# Patient Record
Sex: Male | Born: 1955 | Race: White | Hispanic: No | Marital: Single | Smoking: Current every day smoker
Health system: Southern US, Community
[De-identification: ages and names within clinical notes are randomized; demographics above are authoritative.]

## PROBLEM LIST (undated history)

## (undated) DIAGNOSIS — M1990 Unspecified osteoarthritis, unspecified site: Secondary | ICD-10-CM

## (undated) DIAGNOSIS — I509 Heart failure, unspecified: Secondary | ICD-10-CM

## (undated) DIAGNOSIS — I249 Acute ischemic heart disease, unspecified: Secondary | ICD-10-CM

## (undated) DIAGNOSIS — J449 Chronic obstructive pulmonary disease, unspecified: Secondary | ICD-10-CM

## (undated) DIAGNOSIS — J45909 Unspecified asthma, uncomplicated: Secondary | ICD-10-CM

## (undated) DIAGNOSIS — B1920 Unspecified viral hepatitis C without hepatic coma: Secondary | ICD-10-CM

## (undated) DIAGNOSIS — E119 Type 2 diabetes mellitus without complications: Secondary | ICD-10-CM

## (undated) DIAGNOSIS — I10 Essential (primary) hypertension: Secondary | ICD-10-CM

---

## 2005-09-29 ENCOUNTER — Ambulatory Visit: Payer: Self-pay

## 2005-10-28 ENCOUNTER — Ambulatory Visit: Payer: Self-pay

## 2005-11-08 ENCOUNTER — Emergency Department: Payer: Self-pay | Admitting: Emergency Medicine

## 2005-11-08 ENCOUNTER — Other Ambulatory Visit: Payer: Self-pay

## 2006-05-16 ENCOUNTER — Emergency Department: Payer: Self-pay | Admitting: Unknown Physician Specialty

## 2006-05-16 ENCOUNTER — Other Ambulatory Visit: Payer: Self-pay

## 2007-03-20 ENCOUNTER — Other Ambulatory Visit: Payer: Self-pay

## 2007-03-20 ENCOUNTER — Emergency Department: Payer: Self-pay | Admitting: Emergency Medicine

## 2009-11-04 ENCOUNTER — Emergency Department: Payer: Self-pay | Admitting: Emergency Medicine

## 2011-05-22 ENCOUNTER — Inpatient Hospital Stay: Payer: Self-pay | Admitting: Internal Medicine

## 2011-05-23 DIAGNOSIS — R079 Chest pain, unspecified: Secondary | ICD-10-CM

## 2011-05-23 DIAGNOSIS — R7989 Other specified abnormal findings of blood chemistry: Secondary | ICD-10-CM

## 2011-05-25 DIAGNOSIS — I369 Nonrheumatic tricuspid valve disorder, unspecified: Secondary | ICD-10-CM

## 2011-06-02 ENCOUNTER — Emergency Department: Payer: Self-pay | Admitting: Internal Medicine

## 2011-06-09 ENCOUNTER — Emergency Department: Payer: Self-pay | Admitting: Emergency Medicine

## 2011-06-16 ENCOUNTER — Emergency Department: Payer: Self-pay | Admitting: Emergency Medicine

## 2011-06-21 ENCOUNTER — Emergency Department: Payer: Self-pay | Admitting: *Deleted

## 2011-06-23 ENCOUNTER — Emergency Department: Payer: Self-pay | Admitting: Emergency Medicine

## 2011-09-08 ENCOUNTER — Ambulatory Visit: Payer: Self-pay

## 2011-09-09 ENCOUNTER — Ambulatory Visit: Payer: Self-pay

## 2012-01-17 ENCOUNTER — Ambulatory Visit: Payer: Self-pay | Admitting: Gastroenterology

## 2012-02-29 ENCOUNTER — Ambulatory Visit: Payer: Self-pay | Admitting: Gastroenterology

## 2012-03-02 LAB — PATHOLOGY REPORT

## 2012-03-26 ENCOUNTER — Emergency Department: Payer: Self-pay | Admitting: *Deleted

## 2012-03-26 LAB — CK TOTAL AND CKMB (NOT AT ARMC)
CK, Total: 84 U/L (ref 35–232)
CK-MB: 1.2 ng/mL (ref 0.5–3.6)

## 2012-03-26 LAB — CBC
MCH: 30.9 pg (ref 26.0–34.0)
Platelet: 300 10*3/uL (ref 150–440)
RBC: 4.96 10*6/uL (ref 4.40–5.90)
RDW: 13.6 % (ref 11.5–14.5)
WBC: 8.7 10*3/uL (ref 3.8–10.6)

## 2012-03-26 LAB — BASIC METABOLIC PANEL
BUN: 11 mg/dL (ref 7–18)
Co2: 31 mmol/L (ref 21–32)
Creatinine: 0.96 mg/dL (ref 0.60–1.30)
EGFR (African American): >60
EGFR (Non-African Amer.): >60
Glucose: 190 mg/dL — ABNORMAL HIGH (ref 65–99)
Potassium: 4 mmol/L (ref 3.5–5.1)
Sodium: 140 mmol/L (ref 136–145)

## 2012-03-26 LAB — TROPONIN I: Troponin-I: 0.05 ng/mL

## 2012-03-27 ENCOUNTER — Emergency Department: Payer: Self-pay | Admitting: Emergency Medicine

## 2012-03-27 LAB — COMPREHENSIVE METABOLIC PANEL
Alkaline Phosphatase: 66 U/L (ref 50–136)
BUN: 12 mg/dL (ref 7–18)
Bilirubin,Total: 0.3 mg/dL (ref 0.2–1.0)
Calcium, Total: 9 mg/dL (ref 8.5–10.1)
Co2: 30 mmol/L (ref 21–32)
EGFR (Non-African Amer.): >60
Potassium: 3.7 mmol/L (ref 3.5–5.1)
SGPT (ALT): 30 U/L

## 2012-03-27 LAB — CBC
MCH: 30.7 pg (ref 26.0–34.0)
MCHC: 32.4 g/dL (ref 32.0–36.0)
MCV: 95 fL (ref 80–100)
Platelet: 300 10*3/uL (ref 150–440)
RBC: 4.99 10*6/uL (ref 4.40–5.90)
WBC: 9.6 10*3/uL (ref 3.8–10.6)

## 2012-03-28 ENCOUNTER — Ambulatory Visit: Payer: Self-pay | Admitting: Emergency Medicine

## 2012-03-28 ENCOUNTER — Emergency Department: Payer: Self-pay | Admitting: *Deleted

## 2012-03-28 LAB — URINALYSIS, COMPLETE
Bacteria: NEGATIVE
Blood: NEGATIVE
Glucose,UR: NEGATIVE mg/dL (ref 0–75)
Ketone: NEGATIVE
Leukocyte Esterase: NEGATIVE
Protein: NEGATIVE
Specific Gravity: 1.02 (ref 1.003–1.030)
Squamous Epithelial: NONE SEEN
WBC UR: NONE SEEN /HPF (ref 0–5)

## 2012-03-30 LAB — URINE CULTURE

## 2012-07-31 LAB — COMPREHENSIVE METABOLIC PANEL
Anion Gap: 7 (ref 7–16)
BUN: 13 mg/dL (ref 7–18)
Bilirubin,Total: 0.3 mg/dL (ref 0.2–1.0)
Chloride: 106 mmol/L (ref 98–107)
Co2: 27 mmol/L (ref 21–32)
Creatinine: 0.89 mg/dL (ref 0.60–1.30)
EGFR (African American): >60
EGFR (Non-African Amer.): >60
Glucose: 115 mg/dL — ABNORMAL HIGH (ref 65–99)
Osmolality: 280 (ref 275–301)
Potassium: 4 mmol/L (ref 3.5–5.1)
SGPT (ALT): 42 U/L (ref 12–78)
Sodium: 140 mmol/L (ref 136–145)

## 2012-07-31 LAB — PROTIME-INR: Prothrombin Time: 12.4 secs (ref 11.5–14.7)

## 2012-07-31 LAB — CBC WITH DIFFERENTIAL/PLATELET
Basophil %: 0.7 %
Eosinophil #: 0.2 10*3/uL (ref 0.0–0.7)
Eosinophil %: 2.2 %
HCT: 44.6 % (ref 40.0–52.0)
HGB: 15.1 g/dL (ref 13.0–18.0)
Lymphocyte #: 2.9 10*3/uL (ref 1.0–3.6)
Lymphocyte %: 35.7 %
MCHC: 33.8 g/dL (ref 32.0–36.0)
Monocyte #: 0.9 x10 3/mm (ref 0.2–1.0)
Monocyte %: 11.3 %
Neutrophil %: 50.1 %
Platelet: 306 10*3/uL (ref 150–440)
WBC: 8.2 10*3/uL (ref 3.8–10.6)

## 2012-08-01 ENCOUNTER — Inpatient Hospital Stay: Payer: Self-pay | Admitting: Family Medicine

## 2012-08-01 LAB — URINALYSIS, COMPLETE
Bacteria: NONE SEEN
Leukocyte Esterase: NEGATIVE
Nitrite: NEGATIVE
Ph: 6 (ref 4.5–8.0)
Protein: NEGATIVE
Specific Gravity: 1.03 (ref 1.003–1.030)
WBC UR: <1 /HPF (ref 0–5)

## 2012-08-01 LAB — CK TOTAL AND CKMB (NOT AT ARMC)
CK, Total: 98 U/L (ref 35–232)
CK, Total: 82 U/L (ref 35–232)
CK-MB: 1.8 ng/mL (ref 0.5–3.6)
CK-MB: 1.7 ng/mL (ref 0.5–3.6)

## 2012-08-01 LAB — TROPONIN I: Troponin-I: <0.02 ng/mL

## 2012-08-02 LAB — BASIC METABOLIC PANEL
Anion Gap: 5 — ABNORMAL LOW (ref 7–16)
BUN: 12 mg/dL (ref 7–18)
Chloride: 110 mmol/L — ABNORMAL HIGH (ref 98–107)
Co2: 26 mmol/L (ref 21–32)
EGFR (Non-African Amer.): >60
Osmolality: 283 (ref 275–301)
Sodium: 141 mmol/L (ref 136–145)

## 2012-08-02 LAB — CBC WITH DIFFERENTIAL/PLATELET
Eosinophil #: 0 10*3/uL (ref 0.0–0.7)
Eosinophil %: 0.4 %
MCH: 30.2 pg (ref 26.0–34.0)
MCHC: 32.4 g/dL (ref 32.0–36.0)
MCV: 93 fL (ref 80–100)
Monocyte #: 1 x10 3/mm (ref 0.2–1.0)
Platelet: 279 10*3/uL (ref 150–440)
RDW: 13.3 % (ref 11.5–14.5)

## 2012-08-03 LAB — BASIC METABOLIC PANEL
Anion Gap: 3 — ABNORMAL LOW (ref 7–16)
BUN: 11 mg/dL (ref 7–18)
Calcium, Total: 8.1 mg/dL — ABNORMAL LOW (ref 8.5–10.1)
Chloride: 105 mmol/L (ref 98–107)
Co2: 30 mmol/L (ref 21–32)
Creatinine: 0.87 mg/dL (ref 0.60–1.30)
EGFR (African American): >60
EGFR (Non-African Amer.): >60
Potassium: 3.9 mmol/L (ref 3.5–5.1)
Sodium: 138 mmol/L (ref 136–145)

## 2012-08-03 LAB — CBC WITH DIFFERENTIAL/PLATELET
Basophil #: 0 10*3/uL (ref 0.0–0.1)
Basophil %: 0.3 %
Eosinophil #: 0.1 10*3/uL (ref 0.0–0.7)
HCT: 42.5 % (ref 40.0–52.0)
Lymphocyte #: 3.4 10*3/uL (ref 1.0–3.6)
Lymphocyte %: 31.4 %
MCH: 31.1 pg (ref 26.0–34.0)
MCV: 94 fL (ref 80–100)
Monocyte %: 8.7 %
Neutrophil #: 6.4 10*3/uL (ref 1.4–6.5)
RBC: 4.54 10*6/uL (ref 4.40–5.90)
RDW: 13.3 % (ref 11.5–14.5)
WBC: 10.9 10*3/uL — ABNORMAL HIGH (ref 3.8–10.6)

## 2012-08-23 LAB — CULTURE, FUNGUS WITHOUT SMEAR

## 2012-12-10 ENCOUNTER — Inpatient Hospital Stay: Payer: Self-pay | Admitting: Internal Medicine

## 2012-12-10 LAB — CBC
HGB: 15.3 g/dL (ref 13.0–18.0)
MCH: 30.4 pg (ref 26.0–34.0)
Platelet: 278 10*3/uL (ref 150–440)
RBC: 5.02 10*6/uL (ref 4.40–5.90)
RDW: 13.9 % (ref 11.5–14.5)
WBC: 10.4 10*3/uL (ref 3.8–10.6)

## 2012-12-10 LAB — BASIC METABOLIC PANEL
BUN: 16 mg/dL (ref 7–18)
Chloride: 104 mmol/L (ref 98–107)
Co2: 29 mmol/L (ref 21–32)
Creatinine: 1.22 mg/dL (ref 0.60–1.30)
EGFR (Non-African Amer.): >60
Sodium: 139 mmol/L (ref 136–145)

## 2012-12-10 LAB — HEPATIC FUNCTION PANEL A (ARMC)
Albumin: 3.2 g/dL — ABNORMAL LOW (ref 3.4–5.0)
Alkaline Phosphatase: 70 U/L (ref 50–136)
Bilirubin,Total: 0.3 mg/dL (ref 0.2–1.0)
SGOT(AST): 24 U/L (ref 15–37)
SGPT (ALT): 36 U/L (ref 12–78)
Total Protein: 7.9 g/dL (ref 6.4–8.2)

## 2012-12-10 LAB — LIPASE, BLOOD: Lipase: 346 U/L (ref 73–393)

## 2012-12-10 LAB — CK TOTAL AND CKMB (NOT AT ARMC): CK, Total: 87 U/L (ref 35–232)

## 2012-12-11 DIAGNOSIS — R079 Chest pain, unspecified: Secondary | ICD-10-CM

## 2012-12-11 DIAGNOSIS — I059 Rheumatic mitral valve disease, unspecified: Secondary | ICD-10-CM

## 2012-12-11 LAB — LIPID PANEL
Cholesterol: 172 mg/dL (ref 0–200)
HDL Cholesterol: 32 mg/dL — ABNORMAL LOW (ref 40–60)
Ldl Cholesterol, Calc: 132 mg/dL — ABNORMAL HIGH (ref 0–100)
Triglycerides: 41 mg/dL (ref 0–200)
VLDL Cholesterol, Calc: 8 mg/dL (ref 5–40)

## 2012-12-11 LAB — TROPONIN I
Troponin-I: <0.02 ng/mL
Troponin-I: <0.02 ng/mL

## 2012-12-11 LAB — CBC WITH DIFFERENTIAL/PLATELET
Basophil #: 0 10*3/uL (ref 0.0–0.1)
Basophil %: 0.4 %
Eosinophil #: 0 10*3/uL (ref 0.0–0.7)
Eosinophil %: 0 %
HGB: 14.6 g/dL (ref 13.0–18.0)
Lymphocyte #: 0.5 10*3/uL — ABNORMAL LOW (ref 1.0–3.6)
MCH: 31 pg (ref 26.0–34.0)
MCHC: 33.4 g/dL (ref 32.0–36.0)
Neutrophil #: 8.9 10*3/uL — ABNORMAL HIGH (ref 1.4–6.5)
Neutrophil %: 92.9 %
Platelet: 260 10*3/uL (ref 150–440)
RBC: 4.7 10*6/uL (ref 4.40–5.90)
WBC: 9.5 10*3/uL (ref 3.8–10.6)

## 2012-12-11 LAB — DRUG SCREEN, URINE
Amphetamines, Ur Screen: NEGATIVE (ref ?–1000)
Barbiturates, Ur Screen: NEGATIVE (ref ?–200)
Benzodiazepine, Ur Scrn: NEGATIVE (ref ?–200)
Cannabinoid 50 Ng, Ur ~~LOC~~: NEGATIVE (ref ?–50)
Cocaine Metabolite,Ur ~~LOC~~: POSITIVE (ref ?–300)
MDMA (Ecstasy)Ur Screen: NEGATIVE (ref ?–500)
Methadone, Ur Screen: NEGATIVE (ref ?–300)
Tricyclic, Ur Screen: NEGATIVE (ref ?–1000)

## 2012-12-11 LAB — BASIC METABOLIC PANEL
Chloride: 103 mmol/L (ref 98–107)
EGFR (Non-African Amer.): >60
Glucose: 216 mg/dL — ABNORMAL HIGH (ref 65–99)
Potassium: 4.1 mmol/L (ref 3.5–5.1)
Sodium: 136 mmol/L (ref 136–145)

## 2012-12-11 LAB — CK-MB: CK-MB: 0.9 ng/mL (ref 0.5–3.6)

## 2012-12-20 ENCOUNTER — Emergency Department: Payer: Self-pay | Admitting: Internal Medicine

## 2013-05-05 ENCOUNTER — Emergency Department: Payer: Self-pay | Admitting: Emergency Medicine

## 2013-05-09 LAB — WOUND CULTURE

## 2014-12-23 NOTE — H&P (Signed)
PATIENT NAME:  Kyle Rich, [name] MR#:  [id number] DATE OF BIRTH:  [birth year]  DATE OF ADMISSION:  08/01/2012   PRIMARY CARE PHYSICIAN: None local.   CHIEF COMPLAINT: Coughing up blood.   HISTORY OF PRESENT ILLNESS: The patient is a 59 year old Caucasian male with a past medical history of COPD, still smoking, oxygen dependent, hypertension, and tobacco abuse who is presenting to the ER with a chief complaint of coughing up blood. The patient was in his usual state of health until yesterday and then started having hemoptysis yesterday afternoon. He denies any similar complaints in the past. This is associated with chest tightness. Denies any chest pain or shortness of breath. He denies any weight loss but, in fact, he is gaining weight. Denies any dizziness or loss of consciousness. Denies any sick contacts or tuberculosis. His mom had history of tuberculosis when she was young. CT of chest was done in the ER which showed no evidence of acute pulmonary embolism and no gross endobronchial lesions. The patient denies any fever, chills. Hospitalist team is called to admit the patient regarding hemoptysis for possible bronchoscopy in a.m.   PAST MEDICAL HISTORY:  1. Hypertension. 2. Tobacco abuse. 3. Chronic obstructive pulmonary disease, oxygen dependent. He is on 2 liters of oxygen.   PAST SURGICAL HISTORY: None.  ALLERGIES: No known drug allergies.   HOME MEDICATIONS:  1. Valium 5 mg p.o. 4 times a day. 2. Spiriva 1 puff inhalation once a day.  3. ProAir 2 puff inhalation 4 times a day.  4. Prednisone 10 mg tablets 5 tablets orally once a day and 3 tablets orally once a day.  5. Percocet 10/325 p.o. q.6 hours. 6. Omeprazole 20 mg p.o. daily. 7. Naproxen 500 mg twice a day. 8. Lisinopril 20 mg once a day.  9. Klor-Con 20 mEq 1 tablet once a day.  10. Hydrochlorothiazide 25 mg half tablet once a day. 11. Advair Diskus 250 mcg 1 puff inhalations two times a day.  12. Tylenol 1 tablet p.o. q.6  hours.   PSYCHOSOCIAL HISTORY: Lives at home, lives alone. He uses 2 liters of oxygen 24/7 in view of his COPD. Still smokes 1 pack a day and drinks alcohol at least 2 to 3 times per week.   FAMILY HISTORY: Mother has history of bladder cancer. Father died of complication of the lung.   REVIEW OF SYSTEMS: CONSTITUTIONAL: Denies any weakness. Positive weight gain. EYES: Denies any blurry vision, inflammation of the eye, redness. ENT: Denies any postnasal drip, nasal discharge, tinnitus, ringing in his ears, ear pain, difficulty in swallowing. NECK: Denies any neck pain, neck masses. LUNGS: Positive coughing up blood. Denies pneumonia. Denies any past medical history of tuberculosis. Denies any shortness of breath. Complaining of chest tightness. CARDIOVASCULAR: No chest pain or shortness of breath. No palpitations. No syncope or palpitations. GI: Denies any abdominal pain, constipation. Denies diarrhea. Denies hematemesis or hematochezia. NEUROLOGIC: Denies any dizziness or syncope. Denies any weakness, tiredness, difficulty in swallowing, speech difficulties. Denies any headache, blurry vision. MUSCULOSKELETAL: Denies back pain, neck pain, abdominal pain, shoulder pain, leg pain. Denies any gout, arthritis. SKIN: Denies any rashes, lesions. Denies any clubbing. ENDOCRINE: Denies any polyuria, polyphagia, polydipsia. Denies any diabetes. INTEGUMENTARY: No skin lesions, rashes. GENITOURINARY: Denies dysuria, urinary incontinence. PSYCH: Denies depression, anxiety, schizophrenia, obsessive-compulsive disorder.   PHYSICAL EXAMINATION:   VITAL SIGNS: Temperature 99.4, pulse 88, respiratory rate 22, blood pressure 135/72, pulse oximetry 94 to 96%.   GENERAL APPEARANCE: Not in acute distress.  Answering questions appropriately. Well built and well nourished.   HEENT: Normocephalic, atraumatic. Pupils are equally reacting to light and accommodation. No postnasal drip. No sinus tenderness.   NECK: Supple. No  JVD. No thyromegaly.   LUNGS: Clear to auscultation bilaterally. Moderate air entry. No wheezing. No crackles.   CARDIAC: S1, S2 normal. Regular rate and rhythm. No murmurs. No rales or rubs. Point of maximum impulse is intact.   GI: Soft. Bowel sounds are positive in all four quadrants. Nontender, nondistended.   NEUROLOGIC: Alert and oriented x3. Cranial nerves II through XII are grossly intact. Reflexes are 2+.   SKIN: No rashes. No cyanosis. No lesions.   MUSCULOSKELETAL: No joint swelling or effusion. No erythema is noticed. No gout.    LYMPHATIC: No cervical or axillary lymphadenopathy.   LABORATORY AND IMAGING STUDIES: CT of the chest with contrast for pulmonary embolism has revealed no evidence of any acute pulmonary embolism. No acute thoracic aortic pathology. No gross endobronchial lesions are demonstrated. No acute abnormality of the trachea. There are emphysematous changes in both lungs with tiny bullous lesions in the upper lobes bilaterally. No evidence of pleural effusion.   Glucose 115, BUN 13, creatinine 0.89, sodium 140, potassium 4.0, chloride 106, CO2 27, GFR greater than 60, osmolality 280, calcium 8.4, total protein 7.4, serum albumin 3.3, bilirubin total 0.3, alkaline phosphatase 66, AST 30, ALT 42. Troponin-I less than 0.02. WBC 8.2, hemoglobin 15.1, hematocrit 44.6, platelet count 306,000, MCV 93. PT 12.4. INR 0.9. Activated PTT 27.8.  ASSESSMENT AND PLAN: This is a 59 year old Caucasian male presenting to the ER with chief complaint of coughing up blood since yesterday afternoon associated with chest tightness. He will be admitted with the following assessment and plan.  1. Hemoptysis, etiology unclear. Admit to tele bed. Pulmonology consult is placed with Dr. Clovis Fredrickson group for possible bronchoscopy. Will type and screen and, if necessary, will give blood transfusion. CBC in a.m. Chem-8 in a.m. ordered. Will get sputum culture and sensitivity.  2. Chronic history of  COPD, stable. The patient is chronically oxygen dependent. Will continue oxygen 2 liters via nasal cannula. Will provide him albuterol nebulizer treatments q.4 hours as needed for shortness of breath. Will provide him DuoNeb treatments q.6 hours while awake.  3. Tobacco abuse. The patient continues to smoke. The patient was counseled to quit smoking. Nicotine patch was ordered.  4. Alcohol dependency. The patient is placed on CIWA protocol. The patient was counseled to stop alcohol. We have tried to [place] Dr. Belia Heman two times but were unsuccessful. Will try to reach Dr. Belia Heman again in a.m.  5. Cardiac enzymes x3. 6. GI prophylaxis will be provided with Protonix.  7. DVT with SCDs. Will avoid Lovenox.   CODE STATUS: The patient is FULL CODE.   The diagnosis and plan of care was discussed with the patient and he is aware of the plan.   TOTAL TIME SPENT ON ADMISSION: 50 minutes.   ____________________________ Ramonita Lab, MD ag:drc D: 08/01/2012 05:53:10 ET T: 08/01/2012 07:35:14 ET JOB#: [id number]  cc: Ramonita Lab, MD, <Dictator> Ramonita Lab MD ELECTRONICALLY SIGNED 08/03/2012 3:06

## 2014-12-23 NOTE — Discharge Summary (Signed)
PATIENT NAME:  Kyle Rich, [name] MR#:  [id number] DATE OF BIRTH:  [birth year]  DATE OF ADMISSION:  08/01/2012 DATE OF DISCHARGE:  08/03/2012  REASON FOR ADMISSION: Coughing up blood.   DISCHARGE DIAGNOSES:  1. Hemoptysis.  2. Chronic obstructive pulmonary disease exacerbation.  3. Tobacco abuse.  4. Alcohol abuse.  5. Hypertension.   DISPOSITION: Home.   PROCEDURES: Bronchoscopy without any significant acute lesions.   MEDICATIONS AT DISCHARGE:  1. Lisinopril 20 mg once daily.  2. Klor-Con 20 mEq extended-release once daily.  3. ProAir HFA 90 mcg 2 puffs 4 times a day as needed. 4. Hydrochlorothiazide 12.5 mg once daily.  5. Ranitidine 150 mg 2 times daily.  6. Advair Diskus 250/50 mcg 1 puff 2 times daily.  7. Spiriva 18 mcg once daily.  8. Acetaminophen with oxycodone 325/5 orally every six hours p.r.n. pain.  9. Diazepam 5 mg every eight hours p.r.n. anxiety.  10. Tussionex Pennkinetic 5 mL twice a day.  11. Prednisone taper starting at 40, decreasing 10 mg every two days until gone.   The patient has been counseled for about 12 about the importance of not smoking. The patient has COPD worsening and hemoptysis. He was also counseled on not drinking.   HOSPITAL COURSE: Mr. Kyle Rich is a nice 59 year old gentleman with history of COPD who is a current smoker. He is oxygen dependent on occasions. He has hypertension. He presented to the ER with significant coughing up blood. He was doing okay until the day prior to admission when he started having significant sputum with pure blood during the afternoon. He decided to come to the ER the following day since the patient did not have any significant improvement and started to have some chest tightness. There has not been any other symptoms like weight loss or weight gain. No history of sick contacts. No history of recent infection. Never had tuberculosis. No lymph nodes. The patient was admitted to the hospital for treatment of this condition. On  admission he had a CT scan to rule out pulmonary embolism that revealed no acute problems but significant emphysematous changes in both lungs with tiny bolus lesions in the upper lobes bilaterally. His labs were within normal limits with platelets in the 300's, white count of 8.2, and a hemoglobin of 15.1. INR of 0.9. PTT 27. His electrolytes were overall within normal limits. The patient underwent a bronchoscopy on 08/03/2012, the day of discharge, by Dr. Welton FlakesKhan of Pulmonary who was not able to establish any significant acute point bleeding at this moment. Actually his hemoptysis has resolved and the patient desires to go home. I do not have the full report of the bronchoscopy in front of me but I was notified that it did not show any acute abnormalities. There were no significant tumors or suspicion of cancer. Likely the etiology of the hemoptysis is due to his significant smoking, increased cough, and COPD with exacerbation.   As far as the COPD exacerbation, the patient has been put on albuterol nebs He has been given a prescription for inhalers of Spiriva and Advair Diskus and he needs to continue to use oxygen p.r.n. He has been consulted about tobacco abuse for over 12 minutes at discharge.   Due to his alcohol dependency, the patient was put on CIWA protocol but he is not really committed to stop drinking.   Other medical problems were stable during this hospitalization. The patient wants to go home right away after the procedure and I don't see  any indications to keep him any longer for which we will discharge him. He was recommended to follow-up with Dr. Welton Flakes and his primary care physician within the next two weeks.   TIME SPENT: I spent about 35 minutes with this discharge.   ____________________________ Felipa Furnace, MD rsg:drc D: 08/03/2012 22:28:56 ET T: 08/04/2012 10:01:08 ET JOB#: [id number]  cc: Felipa Furnace, MD, <Dictator> Jeaneen Cala Juanda Chance  MD ELECTRONICALLY SIGNED 08/05/2012 22:59

## 2014-12-23 NOTE — Consult Note (Signed)
PATIENT NAME:  Kyle Rich, DOMANSKI MR#:  [id number] DATE OF BIRTH:  12/08/1955  DATE OF CONSULTATION:  08/01/2012  CONSULTING PHYSICIAN:  Yevonne Pax, MD  REASON FOR CONSULTATION: Hemoptysis.  HISTORY OF PRESENT ILLNESS: The patient is a 59 year old gentleman who is a smoker, came into the hospital because he was coughing up blood. The patient has a history of chronic obstructive pulmonary disease, is oxygen dependent; and he said that he has been having some tightness in his chest and coughing and more shortness of breath, and he says that with the cough he started bringing up some blood and so he came into the hospital for evaluation. In the ED, he had a CT scan of the chest done to look specifically for pulmonary embolism which turned out to be negative. He did have coags done, and pro time, PTT were also fine. The patient has a normal-looking white count.  The patient has never had any issues like this in the past, and he cannot really think of anything that brought it on, and he denies having any tuberculous exposure.   PAST MEDICAL HISTORY: Significant for: 1. Chronic obstructive pulmonary disease, oxygen dependent.  2. Hypertension.  3. Ongoing tobacco use.   ALLERGIES: No known drug allergies.   MEDICATIONS: Medications are reviewed on the electronic medical record as well as the allergies.   FAMILY HISTORY: Positive for bladder cancer.   REVIEW OF SYSTEMS: CONSTITUTIONAL: Generally no weakness. He has gained a little bit of weight. EYES: Negative for diplopia. ENT: Negative for any nasal bleeding. RESPIRATORY: Positive for hemoptysis. NECK: Negative for any masses. CARDIOVASCULAR: Negative for chest pain. He did have some tightness. NEUROLOGIC: Negative for any syncope. MUSCULOSKELETAL: No synovitis or arthritis. SKIN: Without any rashes or bleeding. ENDOCRINE: No heat or cold intolerance. PSYCHIATRIC: Negative for any depression or anxiety. The remainder of the review of systems was  basically unremarkable.  PHYSICAL EXAMINATION:  GENERAL: At the time he was evaluated, temperature was 97.1, pulse 86, respiratory rate 18, blood pressure 162/83. Sats were 93%.   NECK: Supple without any JVD. No adenopathy. No thyromegaly.   CHEST: No rales or rhonchi. Expansion appeared to be equal.   CARDIOVASCULAR: S1, S2 normal. Regular rhythm. No gallop or rub.   ABDOMEN: Soft, nontender.   EXTREMITIES: Without cyanosis or clubbing. Pulses were equal.   NEUROLOGICAL:  Awake and alert, moving all four extremities. Gait was not checked.   MUSCULOSKELETAL: Without any synovial swelling or tenderness.   SKIN: No acute rashes.   LABORATORY, DIAGNOSTIC AND RADIOLOGICAL DATA: The radiological data is as already noted. CBC was basically within normal limits as was the chemistry. The x-rays were reviewed personally by me and basically show no acute infiltrates and changes from chronic obstructive pulmonary disease with hyperexpansion.   IMPRESSION:  1. Hemoptysis.  2. Chronic obstructive pulmonary disease.  3. Ongoing smoking.   PLAN: Based on the fact that he is having hemoptysis, it may be prudent to do a bronchoscopy on this gentleman, and I am going to try to see if we cannot get that scheduled, since the holiday is coming up, probably by Friday. In the meantime, he should be off of all anticoagulants. We will make further recommendations once the bronchoscope is done.       Thank you for consulting me in the care of this patient.  ____________________________ Yevonne Pax, MD sak:cbb D: 08/01/2012 14:06:55 ET T: 08/01/2012 14:32:04 ET JOB#: [id number]  cc: Yevonne Pax, MD, <Dictator>  Yevonne PaxSAADAT A KHAN MD ELECTRONICALLY SIGNED 08/30/2012 9:31

## 2014-12-26 NOTE — H&P (Signed)
PATIENT NAME:  Kyle [name], Kyle [name] MR#:  [id number] DATE OF BIRTH:  12/24/1955  DATE OF ADMISSION:  12/10/2012  [facility]   REFERRING PHYSICIAN: Dr. Brien MatesBraud.   PULMONOLOGIST: Dr. Clovis FredricksonKasa's group.   PREVIOUS CARDIOLOGIST: Dr. [facility]ell [facility]ming[facility].   CHIEF COMPLAINT: Shortness of breath and chest pain.   HISTORY OF PRESENT ILLNESS: The patient is a 59 year old Caucasian male with a past medical history of COPD. chronic respiratory failure at least on 2 liters of oxygen, hypertension, still smoking, admits some illicit drugs. He is presenting to the ER with a chief complaint of shortness of breath associated with chest pain for 1 day. The patient is reporting that he was in his usual state of health until this morning. The patient suddenly started having shortness of breath and started feeling tight in his chest. This was associated with stabbing chest pain in the middle of the chest radiating to the shoulder blades and the back of his head. The patient is also reporting a productive cough with dark yellowish phlegm. He denies any blood in his sputum. He was admitted with similar complaint of shortness of breath in the past in November 2013, and at that time, he has had a bronchoscopy done by Dr. Clovis FredricksonKasa's group for hemoptysis. The bronchoscopy was apparently normal according to the discharge summary. This time, as the patient was complaining of stabbing chest pain radiating to the back and shoulder blades, he had CT angiogram of chest done, and pulmonary embolism as well as dissection was ruled out. The patient was given Solu-Medrol and DuoNeb treatments. Hospitalist team is called to admit the patient for COPD exacerbation and also to rule out acute MI. First set of cardiac enzymes are negative. During my examination, the patient's shortness of breath is slightly better but still feeling tight in his chest. Midsternal chest pain has resolved. No family members are at bedside.   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   PAST SURGICAL HISTORY: Bronchoscopy in November 2013 with no acute abnormalities according to the discharge summary in November.   ALLERGIES: The patient has no known drug allergies.   HOME MEDICATION LIST: Spiriva 18 mcg inhalation once daily, ranitidine 150 mg twice a day, ProAir 2 puff inhalation 4 times a day, lisinopril 20 mg once daily, Klor-Con 20 mg once daily, hydrochlorothiazide 25 mg 1/2 tablet once a day, Advair 250 mcg Diskus 1 puff inhalation 2 times a day.   PSYCHOSOCIAL HISTORY: Lives at home. Mom lives with him. Admits smoking. Smokes 1 pack for 2 to 3 days. Occasional intake of alcohol. Admits marijuana and other street drugs.   FAMILY HISTORY: Mother had history of bladder cancer. Father died of complications of the lung.   REVIEW OF SYSTEMS:   CONSTITUTIONAL: Denies any fever or weakness. Complaining of weight gain of 10 pounds. Denies any weight loss.  EYES: No blurry vision, glaucoma, cataracts.  ENT: Denies any epistaxis or discharge. Denies any difficulty in swallowing or redness of the oropharynx.  RESPIRATORY: Complaining of cough which is productive in nature, wheezing and has a history of COPD.   CARDIOVASCULAR: Complaining of chest pain in the middle of the chest. Positive shortness of breath. Denies any palpitations or syncope.  GASTROINTESTINAL: No nausea, vomiting, diarrhea or GERD. Denies any hematemesis or melena.  GENITOURINARY: No dysuria or hematuria.  ENDOCRINE: No polyuria,  nocturia or thyroid problems.  HEMATOLOGIC AND LYMPHATIC: No anemia or easy bruising.  INTEGUMENTARY: No acne, rash, lesions.  MUSCULOSKELETAL: Complaining of chest pain radiating to the mid back and to the shoulders. Denies any gout or weakness.  NEUROLOGIC: No vertigo, ataxia, dementia.  PSYCHIATRIC:  Denies any insomnia, ADD, OCD or bipolar disorder.   PHYSICAL EXAMINATION:  VITAL SIGNS: Temperature 98.8, pulse 90, respirations 24, blood pressure is 162/79, pulse ox 96%  GENERAL APPEARANCE: Not in any acute distress, moderately built and moderately nourished.  HEENT: Normocephalic, atraumatic. Pupils are equally reacting to light and accommodation. No scleral icterus. No conjunctival injection. Extraocular movements are intact. No sinus tenderness. Moist mucous membranes. Oropharynx with no exudates. Uvula is midline. Tympanic membranes are intact.  NECK: Supple. No JVD. No thyromegaly. No lymphadenopathy.  LUNGS: Moderate air entry. Diffuse wheezing is present associated with cracking sounds in the lower lung fields. No accessory muscle usage. No anterior chest wall tenderness on palpation.  CARDIAC: S1, S2 normal. Regular rate and rhythm. No gallops. No thrills. No edema.  GASTROINTESTINAL: Soft. Bowel sounds are positive in all 4 quadrants. Obese. No hepatosplenomegaly. No masses felt. No rebound tenderness. Nondistended.  NEUROLOGIC: Awake, alert, oriented x3. Motor and sensory are grossly intact. Cranial nerves II through XII are grossly intact. Reflexes are 2+.  MUSCULOSKELETAL: No joint effusion, tenderness or erythema. Left upper extremity is intact in carpal tunnel brace.  EXTREMITIES: No edema. No cyanosis. No clubbing. Peripheral pulses are 2+.  SKIN: No rashes, lesions. Normal skin turgor. Warm to touch.  MUSCULOSKELETAL:   LABS AND IMAGING STUDIES: Chest x-ray, PA and lateral view, has revealed mild increase in interstitial markings. Cannot exclude subsegmental atelectasis or infiltrate need to ruleout pneumonia in the appropriate clinical setting. There is no obvious infiltrate or definite evidence of pulmonary edema. Followup films are recommended following any therapy if the patient's symptoms do not resolve. CT angiogram of the chest has revealed no pulmonary embolism, COPD,  coronary artery disease. Serum osmolality 281. Calcium 8.7. Lipase 346. Glucose 146, BUN 15, creatinine 1.22, sodium 139, potassium 4.1. Total serum protein is 7.9. Albumin 3.2. Bilirubin total 0.3. The rest of the LFTs are within normal limits. CK total 87, CPK-MB 1.2, troponin less than 0.02. WBC 10.4, hemoglobin 15.3, hematocrit 46.8, platelet count 278, MCV is 93. Urine drug screen is ordered which is pending. A 12-lead EKG has revealed normal sinus rhythm at 86 beats per minute, normal PR and QRS interval, nonspecific ST-T wave changes, normal QT and corrected QT intervals.   ASSESSMENT AND PLAN: A 59 year old Caucasian male presenting to the ER with a chief complaint of shortness of breath and chest pain radiating to the shoulder blades and a productive cough for 1 day. Will be admitted with the following assessment and plan:  1. Acute exacerbation of chronic obstructive pulmonary disease: Will admit to telemetry. Will provide him Solu-Medrol 60 mg intravenous q.6 hours. DuoNeb nebulizer treatments q.6 hours and albuterol nebulizer treatments on as needed basis. Levaquin 750 mg intravenous q.24 hours. Sputum culture and sensitivities ordered.  2. Possible early pneumonia: Will get sputum culture and sensitivity. The patient will be receiving Levaquin 750 mg intravenous once daily.  3. Chest pain which is atypical in nature, probably pleuritic from coughing, possible early pneumonia versus illicit drug usage. The plan is to cycle cardiac biomarkers. Will implement acute coronary syndrome (ACS) protocol with oxygen, nitroglycerin, aspirin, beta blocker and statin. Cardiology consult is placed to Dr. Mariah Milling. Echocardiogram is ordered. CT  angiogram of the chest was done, and dissection as well as pulmonary embolism were already ruled out. Urine drug screen is ordered which is pending at this time.  4. Hypertension: Will resume home medications and titrate medications on an as needed basis. Small dose beta  blocker is added to the regimen.  5. Nicotine dependence: The patient was counseled to quit smoking. Will provide him 17 mcg nicotine patch to apply topically and to change once daily.  6. Will provide him gastrointestinal prophylaxis with ranitidine and deep venous thrombosis prophylaxis with Lovenox subcutaneous.   CODE STATUS: The patient is FULL CODE.   The diagnosis and plan of care was discussed in detail with the patient. All of his questions were answered to his satisfaction.   TOTAL TIME SPENT ON ADMISSION: 50 minutes.    ____________________________ Ramonita Lab, MD ag:gb [name]: 12/11/2012 00:06:27 ET T: 12/11/2012 01:51:30 ET JOB#: [id number]  cc: Ramonita Lab, MD, <Dictator> [facility]  Ramonita Lab MD ELECTRONICALLY SIGNED 12/12/2012 1:55

## 2014-12-26 NOTE — Consult Note (Signed)
Brief Consult Note: Diagnosis: CP SOB HTN COPD.   Patient was seen by consultant.   Consult note dictated.   Recommend further assessment or treatment.   Orders entered.   Discussed with Attending MD.   Comments: IMP SOB Possible CHF HTN Obesity Possible angina Smoking COPD Hypoxemia Bronchitis CP . PLAN 02 IV fluids Agree with ECHO INhalers Steroids Pul input Advise to quit smoking Pain control NSAIDS vs narcotic Defer myoview until his resp status improved Cough meds prn.  Electronic Signatures: Dorothyann Pengallwood, Ladarrell Cornwall D (MD)  (Signed 08-Apr-14 13:58)  Authored: Brief Consult Note   Last Updated: 08-Apr-14 13:58 by Alwyn Peaallwood, Altha Sweitzer D (MD)

## 2014-12-26 NOTE — Discharge Summary (Signed)
PATIENT NAME:  Kyle [name], Kyle [name] MR#:  [id number] DATE OF BIRTH:  1955-11-06  DATE OF ADMISSION:  12/10/2012 DATE OF DISCHARGE:  12/12/2012  ADMISSION DIAGNOSIS:  Acute chronic obstructive pulmonary disease exacerbation.   DISCHARGE DIAGNOSES:  1.  Acute on chronic respiratory failure secondary to acute chronic obstructive pulmonary disease exacerbation.  2.  Chest pain.  3.  Back pain.  4.  Chronic hepatitis C.   CONSULTATIONS:  Dr. Juliann Paresallwood.   PERTINENT LABORATORY DATA:  Troponin x 3 were all negative.   White blood cells 9.5, hemoglobin 14.6, hematocrit 44, platelets are 260, sodium 136, potassium 4.1, chloride 103, bicarbonate 26, BUN 16, creatinine 1.13, glucose 216.  CT of the chest showed no PE or infiltrate.   Chest x-ray showed no acute infiltrate.  Urine tox was positive for cocaine.   HOSPITAL COURSE:  This is a 59 year old male with history of COPD who presents with COPD exacerbation.  For further details, please refer to the history and physical.  1.  Acute COPD exacerbation with acute on chronic respiratory failure.  The patient was started on IV steroids.  He was weaned off the steroids and he is back on his baseline oxygen.  He is back at his baseline status.  He had some mild wheezing at discharge, but he feels comfortable to go home.  2.  Chest pain.  Cardiology consult was appreciated.  This is atypical chest pain.  Troponins are negative, but we will have him follow up as an outpatient with cardiology.  The patient does have recent cocaine use.  3.  Back pain.  Sounds more musculoskeletal in nature, likely from coughing.  4.  Chronic hepatitis C.  No issues.   DISCHARGE MEDICATIONS: 1.  Lisinopril 20 mg daily.  2.  K-Chlor 20 mEq daily.  3.  ProAir 2 puffs 4 times daily as needed.  4.  HCTZ 25 mg 1/2 tablet daily.  5.  Ranitidine 150 twice daily.  6.  Advair Diskus 250/50 twice daily.  7.  Spiriva 18 mcg daily.  8.  DuoNebs q. 4 hours as needed for shortness of breath.   9.  Nicotine patch 14 mg weekly.  10.  Prednisone taper starting at 50 mg, taper by 10 mg every three days.  11.  Percocet 5/325 4 times a day as needed pain, #20.   DISCHARGE OXYGEN:  4 liters.   DISCHARGE DIET:  Low sodium.   DISCHARGE ACTIVITY:  As tolerated.   DISCHARGE FOLLOW-UP:  Dr. Dorothyann Pengwayne Callwood in 1 to 2 weeks.   TIME SPENT:  35 minutes.    ____________________________ Janyth ContesSital P. Juliene PinaMody, MD spm:ea [name]: 12/12/2012 16:05:17 ET T: 12/13/2012 01:34:21 ET JOB#: [id number]  cc: Tayana Shankle P. Juliene PinaMody, MD, <Dictator> Dwayne [name]. Juliann Paresallwood, MD Janyth ContesSITAL P Chistopher Mangino MD ELECTRONICALLY SIGNED 12/13/2012 13:28

## 2015-05-14 ENCOUNTER — Other Ambulatory Visit: Payer: Self-pay | Admitting: Family Medicine

## 2015-05-14 DIAGNOSIS — R109 Unspecified abdominal pain: Secondary | ICD-10-CM

## 2015-05-14 DIAGNOSIS — M7989 Other specified soft tissue disorders: Secondary | ICD-10-CM

## 2015-05-15 ENCOUNTER — Ambulatory Visit
Admission: RE | Admit: 2015-05-15 | Discharge: 2015-05-15 | Disposition: A | Discharge status: Home / Self Care | Payer: Medicaid Other | Source: Ambulatory Visit | Attending: Family Medicine | Admitting: Family Medicine

## 2015-05-15 DIAGNOSIS — R911 Solitary pulmonary nodule: Secondary | ICD-10-CM | POA: Diagnosis not present

## 2015-05-15 DIAGNOSIS — R109 Unspecified abdominal pain: Secondary | ICD-10-CM | POA: Diagnosis present

## 2015-05-15 DIAGNOSIS — M7989 Other specified soft tissue disorders: Secondary | ICD-10-CM

## 2015-05-15 DIAGNOSIS — R1032 Left lower quadrant pain: Secondary | ICD-10-CM | POA: Diagnosis not present

## 2015-05-15 HISTORY — DX: Heart failure, unspecified: I50.9

## 2015-05-15 HISTORY — DX: Essential (primary) hypertension: I10

## 2015-05-15 HISTORY — DX: Unspecified asthma, uncomplicated: J45.909

## 2015-05-15 MED ORDER — IOHEXOL 300 MG/ML  SOLN
100.0000 mL | Freq: Once | INTRAMUSCULAR | Status: AC | PRN
  Administered 2015-05-15: 100 mL via INTRAVENOUS

## 2015-05-15 MED ORDER — BARIUM SULFATE 2 % PO SUSP: 450.0000 mL | Freq: Once | ORAL | Status: DC

## 2015-05-25 ENCOUNTER — Other Ambulatory Visit: Payer: Self-pay | Admitting: Specialist

## 2015-05-25 DIAGNOSIS — R918 Other nonspecific abnormal finding of lung field: Secondary | ICD-10-CM

## 2015-06-09 ENCOUNTER — Inpatient Hospital Stay
Admission: EM | Admit: 2015-06-09 | Discharge: 2015-06-10 | DRG: 189 | Disposition: A | Discharge status: Home / Self Care | Payer: Medicaid Other | Attending: Internal Medicine | Admitting: Internal Medicine

## 2015-06-09 ENCOUNTER — Emergency Department: Payer: Medicaid Other

## 2015-06-09 ENCOUNTER — Inpatient Hospital Stay: Payer: Medicaid Other

## 2015-06-09 DIAGNOSIS — J9621 Acute and chronic respiratory failure with hypoxia: Secondary | ICD-10-CM | POA: Diagnosis not present

## 2015-06-09 DIAGNOSIS — Z8052 Family history of malignant neoplasm of bladder: Secondary | ICD-10-CM

## 2015-06-09 DIAGNOSIS — I2510 Atherosclerotic heart disease of native coronary artery without angina pectoris: Secondary | ICD-10-CM | POA: Diagnosis present

## 2015-06-09 DIAGNOSIS — F419 Anxiety disorder, unspecified: Secondary | ICD-10-CM | POA: Diagnosis present

## 2015-06-09 DIAGNOSIS — J40 Bronchitis, not specified as acute or chronic: Secondary | ICD-10-CM

## 2015-06-09 DIAGNOSIS — J441 Chronic obstructive pulmonary disease with (acute) exacerbation: Secondary | ICD-10-CM | POA: Diagnosis present

## 2015-06-09 DIAGNOSIS — R0602 Shortness of breath: Secondary | ICD-10-CM

## 2015-06-09 DIAGNOSIS — T380X5A Adverse effect of glucocorticoids and synthetic analogues, initial encounter: Secondary | ICD-10-CM | POA: Diagnosis present

## 2015-06-09 DIAGNOSIS — I10 Essential (primary) hypertension: Secondary | ICD-10-CM | POA: Diagnosis present

## 2015-06-09 DIAGNOSIS — Z9981 Dependence on supplemental oxygen: Secondary | ICD-10-CM

## 2015-06-09 DIAGNOSIS — R079 Chest pain, unspecified: Secondary | ICD-10-CM

## 2015-06-09 DIAGNOSIS — E871 Hypo-osmolality and hyponatremia: Secondary | ICD-10-CM | POA: Diagnosis present

## 2015-06-09 DIAGNOSIS — B1920 Unspecified viral hepatitis C without hepatic coma: Secondary | ICD-10-CM | POA: Diagnosis present

## 2015-06-09 DIAGNOSIS — R0603 Acute respiratory distress: Secondary | ICD-10-CM

## 2015-06-09 DIAGNOSIS — Z801 Family history of malignant neoplasm of trachea, bronchus and lung: Secondary | ICD-10-CM

## 2015-06-09 DIAGNOSIS — I509 Heart failure, unspecified: Secondary | ICD-10-CM | POA: Diagnosis present

## 2015-06-09 DIAGNOSIS — J189 Pneumonia, unspecified organism: Secondary | ICD-10-CM | POA: Diagnosis present

## 2015-06-09 DIAGNOSIS — M549 Dorsalgia, unspecified: Secondary | ICD-10-CM

## 2015-06-09 DIAGNOSIS — F17210 Nicotine dependence, cigarettes, uncomplicated: Secondary | ICD-10-CM | POA: Diagnosis present

## 2015-06-09 HISTORY — DX: Unspecified viral hepatitis C without hepatic coma: B19.20

## 2015-06-09 HISTORY — DX: Chronic obstructive pulmonary disease, unspecified: J44.9

## 2015-06-09 LAB — CBC
HEMATOCRIT: 46 % (ref 40.0–52.0)
Hemoglobin: 15.2 g/dL (ref 13.0–18.0)
MCH: 30.8 pg (ref 26.0–34.0)
MCHC: 33 g/dL (ref 32.0–36.0)
MCV: 93.4 fL (ref 80.0–100.0)
Platelets: 261 10*3/uL (ref 150–440)
RBC: 4.93 MIL/uL (ref 4.40–5.90)
RDW: 14.3 % (ref 11.5–14.5)
WBC: 14.9 10*3/uL — AB (ref 3.8–10.6)

## 2015-06-09 LAB — COMPREHENSIVE METABOLIC PANEL
ALT: 34 U/L (ref 17–63)
AST: 27 U/L (ref 15–41)
Albumin: 3.7 g/dL (ref 3.5–5.0)
Alkaline Phosphatase: 57 U/L (ref 38–126)
Anion gap: 7 (ref 5–15)
BILIRUBIN TOTAL: 0.9 mg/dL (ref 0.3–1.2)
BUN: 12 mg/dL (ref 6–20)
CO2: 32 mmol/L (ref 22–32)
CREATININE: 0.92 mg/dL (ref 0.61–1.24)
Calcium: 8.9 mg/dL (ref 8.9–10.3)
Chloride: 100 mmol/L — ABNORMAL LOW (ref 101–111)
Glucose, Bld: 134 mg/dL — ABNORMAL HIGH (ref 65–99)
Potassium: 4.3 mmol/L (ref 3.5–5.1)
Sodium: 139 mmol/L (ref 135–145)
TOTAL PROTEIN: 7.8 g/dL (ref 6.5–8.1)

## 2015-06-09 LAB — TROPONIN I

## 2015-06-09 LAB — BLOOD GAS, VENOUS
ACID-BASE EXCESS: 5.6 mmol/L — AB (ref 0.0–3.0)
Bicarbonate: 33.1 mEq/L — ABNORMAL HIGH (ref 21.0–28.0)
FIO2: 28
PATIENT TEMPERATURE: 37
pCO2, Ven: 60 mmHg (ref 44.0–60.0)
pH, Ven: 7.35 (ref 7.320–7.430)

## 2015-06-09 LAB — BRAIN NATRIURETIC PEPTIDE: B NATRIURETIC PEPTIDE 5: 18 pg/mL (ref 0.0–100.0)

## 2015-06-09 MED ORDER — ALBUTEROL SULFATE (2.5 MG/3ML) 0.083% IN NEBU: 3.0000 mL | INHALATION_SOLUTION | RESPIRATORY_TRACT | Status: DC | PRN

## 2015-06-09 MED ORDER — ACETAMINOPHEN 650 MG RE SUPP: 650.0000 mg | Freq: Four times a day (QID) | RECTAL | Status: DC | PRN

## 2015-06-09 MED ORDER — LISINOPRIL 10 MG PO TABS
10.0000 mg | ORAL_TABLET | Freq: Every day | ORAL | Status: DC
  Administered 2015-06-09 – 2015-06-10 (×2): 10 mg via ORAL
  Filled 2015-06-09 (×2): qty 1

## 2015-06-09 MED ORDER — MORPHINE SULFATE (PF) 2 MG/ML IV SOLN
INTRAVENOUS | Status: AC
  Administered 2015-06-09: 2 mg via INTRAVENOUS
  Filled 2015-06-09: qty 1

## 2015-06-09 MED ORDER — IOHEXOL 350 MG/ML SOLN
100.0000 mL | Freq: Once | INTRAVENOUS | Status: AC | PRN
  Administered 2015-06-09: 100 mL via INTRAVENOUS

## 2015-06-09 MED ORDER — ACETAMINOPHEN 325 MG PO TABS: 650.0000 mg | ORAL_TABLET | Freq: Four times a day (QID) | ORAL | Status: DC | PRN

## 2015-06-09 MED ORDER — ALBUTEROL SULFATE (2.5 MG/3ML) 0.083% IN NEBU
10.0000 mg | INHALATION_SOLUTION | Freq: Once | RESPIRATORY_TRACT | Status: AC
  Administered 2015-06-09: 10 mg via RESPIRATORY_TRACT
  Filled 2015-06-09: qty 12

## 2015-06-09 MED ORDER — HYDROCHLOROTHIAZIDE 25 MG PO TABS
12.5000 mg | ORAL_TABLET | Freq: Every day | ORAL | Status: DC
  Administered 2015-06-09 – 2015-06-10 (×2): 12.5 mg via ORAL
  Filled 2015-06-09 (×2): qty 1

## 2015-06-09 MED ORDER — IPRATROPIUM-ALBUTEROL 0.5-2.5 (3) MG/3ML IN SOLN
3.0000 mL | Freq: Once | RESPIRATORY_TRACT | Status: AC
  Administered 2015-06-09: 3 mL via RESPIRATORY_TRACT

## 2015-06-09 MED ORDER — IPRATROPIUM-ALBUTEROL 0.5-2.5 (3) MG/3ML IN SOLN
3.0000 mL | RESPIRATORY_TRACT | Status: DC
  Administered 2015-06-09 – 2015-06-10 (×7): 3 mL via RESPIRATORY_TRACT
  Filled 2015-06-09 (×8): qty 3

## 2015-06-09 MED ORDER — ONDANSETRON HCL 4 MG PO TABS: 4.0000 mg | ORAL_TABLET | Freq: Four times a day (QID) | ORAL | Status: DC | PRN

## 2015-06-09 MED ORDER — ENOXAPARIN SODIUM 40 MG/0.4ML ~~LOC~~ SOLN
40.0000 mg | SUBCUTANEOUS | Status: DC
  Filled 2015-06-09: qty 0.4

## 2015-06-09 MED ORDER — POTASSIUM CHLORIDE CRYS ER 20 MEQ PO TBCR
20.0000 meq | EXTENDED_RELEASE_TABLET | Freq: Every day | ORAL | Status: DC
  Administered 2015-06-09 – 2015-06-10 (×2): 20 meq via ORAL
  Filled 2015-06-09 (×2): qty 1

## 2015-06-09 MED ORDER — HYDROCODONE-ACETAMINOPHEN 5-325 MG PO TABS
1.0000 | ORAL_TABLET | ORAL | Status: DC | PRN
  Administered 2015-06-09 (×2): 2 via ORAL
  Administered 2015-06-09: 13:00:00 1 via ORAL
  Administered 2015-06-10 (×2): 2 via ORAL
  Filled 2015-06-09 (×3): qty 2
  Filled 2015-06-09: qty 1
  Filled 2015-06-09: qty 2

## 2015-06-09 MED ORDER — ALBUTEROL SULFATE HFA 108 (90 BASE) MCG/ACT IN AERS: 2.0000 | INHALATION_SPRAY | Freq: Four times a day (QID) | RESPIRATORY_TRACT | Status: DC | PRN

## 2015-06-09 MED ORDER — MOMETASONE FURO-FORMOTEROL FUM 200-5 MCG/ACT IN AERO
2.0000 | INHALATION_SPRAY | Freq: Two times a day (BID) | RESPIRATORY_TRACT | Status: DC
  Administered 2015-06-09 – 2015-06-10 (×3): 2 via RESPIRATORY_TRACT
  Filled 2015-06-09: qty 8.8

## 2015-06-09 MED ORDER — DEXTROSE 5 % IV SOLN
500.0000 mg | Freq: Once | INTRAVENOUS | Status: AC
  Administered 2015-06-09: 500 mg via INTRAVENOUS
  Filled 2015-06-09: qty 500

## 2015-06-09 MED ORDER — METHYLPREDNISOLONE SODIUM SUCC 125 MG IJ SOLR
125.0000 mg | Freq: Once | INTRAMUSCULAR | Status: AC
  Administered 2015-06-09: 125 mg via INTRAVENOUS
  Filled 2015-06-09: qty 2

## 2015-06-09 MED ORDER — LORAZEPAM 0.5 MG PO TABS: 0.2500 mg | ORAL_TABLET | Freq: Every evening | ORAL | Status: DC | PRN

## 2015-06-09 MED ORDER — METHYLPREDNISOLONE SODIUM SUCC 40 MG IJ SOLR
40.0000 mg | Freq: Four times a day (QID) | INTRAMUSCULAR | Status: DC
  Administered 2015-06-09 – 2015-06-10 (×4): 40 mg via INTRAVENOUS
  Filled 2015-06-09 (×4): qty 1

## 2015-06-09 MED ORDER — ONDANSETRON HCL 4 MG/2ML IJ SOLN: 4.0000 mg | Freq: Four times a day (QID) | INTRAMUSCULAR | Status: DC | PRN

## 2015-06-09 MED ORDER — TIOTROPIUM BROMIDE MONOHYDRATE 18 MCG IN CAPS
18.0000 ug | ORAL_CAPSULE | Freq: Every day | RESPIRATORY_TRACT | Status: DC
  Administered 2015-06-09 – 2015-06-10 (×2): 18 ug via RESPIRATORY_TRACT
  Filled 2015-06-09: qty 5

## 2015-06-09 MED ORDER — MORPHINE SULFATE (PF) 2 MG/ML IV SOLN
2.0000 mg | Freq: Once | INTRAVENOUS | Status: AC
  Administered 2015-06-09: 2 mg via INTRAVENOUS

## 2015-06-09 MED ORDER — ONDANSETRON HCL 4 MG/2ML IJ SOLN
4.0000 mg | Freq: Once | INTRAMUSCULAR | Status: AC
  Administered 2015-06-09: 4 mg via INTRAVENOUS
  Filled 2015-06-09: qty 2

## 2015-06-09 MED ORDER — LEVOFLOXACIN IN D5W 500 MG/100ML IV SOLN
500.0000 mg | INTRAVENOUS | Status: DC
  Administered 2015-06-09: 500 mg via INTRAVENOUS
  Filled 2015-06-09 (×2): qty 100

## 2015-06-09 MED ORDER — NYSTATIN 100000 UNIT/ML MT SUSP
5.0000 mL | Freq: Four times a day (QID) | OROMUCOSAL | Status: DC
  Administered 2015-06-09 – 2015-06-10 (×4): 500000 [IU] via ORAL
  Filled 2015-06-09 (×4): qty 5

## 2015-06-09 MED ORDER — IPRATROPIUM-ALBUTEROL 0.5-2.5 (3) MG/3ML IN SOLN
RESPIRATORY_TRACT | Status: AC
  Administered 2015-06-09: 3 mL via RESPIRATORY_TRACT
  Filled 2015-06-09: qty 3

## 2015-06-09 MED ORDER — MORPHINE SULFATE (PF) 2 MG/ML IV SOLN
2.0000 mg | Freq: Once | INTRAVENOUS | Status: AC
  Administered 2015-06-09: 2 mg via INTRAVENOUS
  Filled 2015-06-09: qty 1

## 2015-06-09 MED ORDER — MAGNESIUM SULFATE 2 GM/50ML IV SOLN
2.0000 g | Freq: Once | INTRAVENOUS | Status: AC
  Administered 2015-06-09: 2 g via INTRAVENOUS
  Filled 2015-06-09: qty 50

## 2015-06-09 NOTE — Progress Notes (Signed)
   06/09/15 1533  Clinical Encounter Type  Visited With Patient  Visit Type Follow-up  Referral From Nurse  Consult/Referral To Chaplain  Advance Directives (For Healthcare)  Does patient have an advance directive? Yes  Would patient like information on creating an advanced directive? No - patient declined information  Order rec'd for AD update. Visited Pt. Who advised that he had one, but didn't know it (his mother apparently has a copy) that everything was taken care of. Chap.Karl Ito [phone]

## 2015-06-09 NOTE — Plan of Care (Signed)
Problem: Discharge Progression Outcomes Goal: Other Discharge Outcomes/Goals Outcome: Progressing VSS. O2 sats in the mid 90's on 3L O2 per [place] as at home. Ambulates independently to the bathroom. No c/o sob. Norco given once for L rib cage pain with improvement. Continue Nebs, inhalers, solumedrol, ABX.

## 2015-06-09 NOTE — ED Provider Notes (Signed)
[facility] Emergency Department Provider Note  ____________________________________________  Time seen: Approximately 610 AM  I have reviewed the triage vital signs and the nursing notes.   HISTORY  Chief Complaint Shortness of Breath    HPI Kyle Rich is a 59 y.o. male who comes in today with difficulty breathing. The patient called EMS because he has been unable to breathe well and has unable to sleep all night. Per EMS the patient was on O2 at home and his sats were 85% on 2 L. The patient reports is also having some pain in his left chest is radiating to his back between his shoulder blades. The patient was given 2 DuoNeb's by EMS but reports it has not helped her shortness of breath. The patient has been agitated and reports that the pain is very severe. The shortness of breath started yesterday and the chest pain did as well. He does as though his ribs are broken. He reports he was laying flat as it was only helping the pain but he was up every 4 hours to try to take his breathing treatments. He has not taken any pain medications. The patient does have a history of COPD and reports that he's had his normal cough whitish mucus which is normal for him.The patient reports this pain is a 10 out of 10 in intensity. Patient is also had no abdominal pain which he reports he was seen for and received ultrasounds and CAT scans that were negative.   Past Medical History  Diagnosis Date  . Asthma   . CHF (congestive heart failure) (HCC)   . Hypertension   . COPD (chronic obstructive pulmonary disease) (HCC)   . Hepatitis C     There are no active problems to display for this patient.   History reviewed. No pertinent past surgical history.  Current Outpatient Rx  Name  Route  Sig  Dispense  Refill  . albuterol (PROAIR HFA) 108 (90 BASE) MCG/ACT inhaler   Inhalation   Inhale 2 puffs into the lungs every 6 (six) hours as needed for wheezing.          Marland Kitchen  albuterol (PROVENTIL) (2.5 MG/3ML) 0.083% nebulizer solution   Inhalation   Inhale 3 mLs into the lungs every 4 (four) hours as needed for wheezing.          . Fluticasone-Salmeterol (ADVAIR DISKUS) 500-50 MCG/DOSE AEPB   Inhalation   Inhale 1 puff into the lungs every 12 (twelve) hours.         . hydrochlorothiazide (HYDRODIURIL) 25 MG tablet   Oral   Take 12.5 mg by mouth daily.         Marland Kitchen lisinopril (PRINIVIL,ZESTRIL) 10 MG tablet   Oral   Take 10 mg by mouth daily.         Marland Kitchen LORazepam (ATIVAN) 0.5 MG tablet   Oral   Take 0.25 mg by mouth at bedtime as needed for anxiety (for up to 10 days at at time).          . nystatin (MYCOSTATIN) 100000 UNIT/ML suspension   Oral   Take 5 mLs by mouth 4 (four) times daily. For 10 days         . potassium chloride SA (K-DUR,KLOR-CON) 20 MEQ tablet   Oral   Take 20 mEq by mouth daily.         Marland Kitchen tiotropium (SPIRIVA) 18 MCG inhalation capsule   Inhalation   Place 18 mcg into inhaler  and inhale daily.           Allergies Review of patient's allergies indicates no known allergies.  History reviewed. No pertinent family history.  Social History Social History  Substance Use Topics  . Smoking status: Current Some Day Smoker -- 0.50 packs/day for 40 years    Types: Cigarettes  . Smokeless tobacco: None  . Alcohol Use: 0.6 oz/week    1 Cans of beer per week    Review of Systems Constitutional: Cold flashes Eyes: No visual changes. ENT: No sore throat. Cardiovascular:  chest pain. Respiratory:  shortness of breath. Gastrointestinal: Abdominal pain.  No nausea, no vomiting.  No diarrhea.  No constipation. Genitourinary: Negative for dysuria. Musculoskeletal:  back pain. Skin: Negative for rash. Neurological: Negative for headaches, focal weakness or numbness.  10-point ROS otherwise negative.  ____________________________________________   PHYSICAL EXAM:  VITAL SIGNS: ED Triage Vitals  Enc Vitals Group      BP --      Pulse Rate 06/09/15 0618 100     Resp --      Temp 06/09/15 0618 98.9 F (37.2 C)     Temp Source 06/09/15 0618 Oral     SpO2 06/09/15 0618 96 %     Weight --      Height 06/09/15 0618  (1.626 m)     Head Cir --      Peak Flow --      Pain Score 06/09/15 0622 10     Pain Loc --      Pain Edu? --      Excl. in GC? --     Constitutional: Alert and oriented. Ill appearing and in A distress. Eyes: Conjunctivae are normal. PERRL. EOMI. Head: Atraumatic. Nose: No congestion/rhinnorhea. Mouth/Throat: Mucous membranes are moist.  Oropharynx non-erythematous. Cardiovascular: Normal rate, regular rhythm. Grossly normal heart sounds.  Good peripheral circulation. Respiratory: Increased respiratory effort. Mild retractions. Expiratory wheezes throughout all lung fields. Gastrointestinal: Soft and nontender. No distention. Positive bowel sounds Musculoskeletal: No lower extremity tenderness nor edema.   Neurologic:  Normal speech and language.  Skin:  Skin is warm, dry and intact. Marland Kitchen Psychiatric: Patient with some mild agitation.  ____________________________________________   LABS (all labs ordered are listed, but only abnormal results are displayed)  Labs Reviewed  CBC - Abnormal; Notable for the following:    WBC 14.9 (*)    All other components within normal limits  COMPREHENSIVE METABOLIC PANEL - Abnormal; Notable for the following:    Chloride 100 (*)    Glucose, Bld 134 (*)    All other components within normal limits  BLOOD GAS, VENOUS - Abnormal; Notable for the following:    Bicarbonate 33.1 (*)    Acid-Base Excess 5.6 (*)    All other components within normal limits  TROPONIN I  BRAIN NATRIURETIC PEPTIDE   ____________________________________________  EKG  ED ECG REPORT I, Rebecka Apley, the attending physician, personally viewed and interpreted this ECG.   Date: 06/09/2015  EKG Time: 614  Rate: 99  Rhythm: normal sinus rhythm   Axis: None  Intervals:none  ST&T Change: None  ____________________________________________  RADIOLOGY  Chest x-ray: Areas of bibasilar lung scarring, no edema or consolidation ____________________________________________   PROCEDURES  Procedure(s) performed: None  Critical Care performed: Yes, see critical care note(s)   CRITICAL CARE Performed by: Lucrezia Europe P   Total critical care time:  Critical care time was exclusive of separately billable procedures and treating  other patients.  Critical care was necessary to treat or prevent imminent or life-threatening deterioration.  Critical care was time spent personally by me on the following activities: development of treatment plan with patient and/or surrogate as well as nursing, discussions with consultants, evaluation of patient's response to treatment, examination of patient, obtaining history from patient or surrogate, ordering and performing treatments and interventions, ordering and review of laboratory studies, ordering and review of radiographic studies, pulse oximetry and re-evaluation of patient's condition.   ____________________________________________   INITIAL IMPRESSION / ASSESSMENT AND PLAN / ED COURSE  Pertinent labs & imaging results that were available during my care of the patient were reviewed by me and considered in my medical decision making (see chart for details).  This is a 59 year old male comes in today with chest pain and shortness of breath. The patient does have some wheezing there is expiratory. He reports that he has been having a lot of difficulty breathing. He also reports that whenever he has these episodes and flares he always hurts on the left side. I will give the patient dose of Solu-Medrol as well as another DuoNeb. I will also add some magnesium to help relax the patient smooth muscles. I will reassess the patient once he's had some blood work and imaging.  The patient was  placed on BiPAP as he continued to have some difficulty breathing. I also gave him a dose of azithromycin and started a continuous albuterol neb. Given the patient's pain that goes into his shoulder blades I will do a CTA of his chest to determine if the patient has a possible dissection. The patient's care was signed out to Dr. Pershing Proud who will follow-up the results of the CT and disposition the patient. ____________________________________________   FINAL CLINICAL IMPRESSION(S) / ED DIAGNOSES  Final diagnoses:  Back pain  Chest pain  Respiratory distress  Bronchitis      Rebecka Apley, MD 06/09/15 [phone]

## 2015-06-09 NOTE — ED Provider Notes (Signed)
-----------------------------------------   10:34 AM on 06/09/2015 -----------------------------------------  Sign out was to follow-up with CTA for dissection. CT is negative for dissection.  Physical Exam  BP 107/36 mmHg  Pulse 81  Temp(Src) 98.9 F (37.2 C) (Oral)  Resp 14  Ht  (1.626 m)  Wt 198 lb (89.812 kg)  BMI 33.97 kg/m2  SpO2 94%  Physical Exam Patient assessment is resting comfortably on BiPAP at this time. Wheezing diffusely but with good air movement at this time. Speaking in full sentences on the BiPAP. ED Course  Procedures  MDM Signed out to Dr. Cherlynn Kaiser of the medicine service for COPD exacerbation.      Myrna Blazer, MD 06/09/15 [phone]

## 2015-06-09 NOTE — ED Notes (Signed)
Pt called EMS for SOB and sharp L CP radiating to back.  On arrival, his SaO2 was in the 80's on his home 2L O2, increased to 6L and SaO2=96.

## 2015-06-09 NOTE — H&P (Signed)
[facility] [place] [facility]   PATIENT NAME: Kyle Rich    MR#:  [id number]  DATE OF BIRTH:  May 28, 1956  DATE OF ADMISSION:  06/09/2015  PRIMARY CARE PHYSICIAN: Rayetta Humphrey, MD   REQUESTING/REFERRING PHYSICIAN: Dr. Gladstone Pih  CHIEF COMPLAINT:   Chief Complaint  Patient presents with  . Shortness of Breath   shortness of breath, chest pain.  HISTORY OF PRESENT ILLNESS:  Kyle Rich  is a 59 y.o. male with a known history of COPD with ongoing tobacco abuse, history of CHF, hypertension, hep C who presented to the hospital due to shortness of breath and chest pain ongoing for the past few days. Patient apparently went to see his primary care physician last week and was placed on a prednisone taper and an oral antibiotic but has not improved. Patient presents today as she has been having significant left-sided/upper back pain associated with worsening shortness of breath. He admits to chills but no documented fever. He admits to a cough which is productive at times with yellow and green sputum. He denies any recent sick contacts. He presented to the emergency room was noted to be hypoxic and noted to have COPD exacerbation and hospitalist services were contacted further treatment and evaluation.  PAST MEDICAL HISTORY:   Past Medical History  Diagnosis Date  . Asthma   . CHF (congestive heart failure) (HCC)   . Hypertension   . COPD (chronic obstructive pulmonary disease) (HCC)   . Hepatitis C     PAST SURGICAL HISTORY:  History reviewed. No pertinent past surgical history.  SOCIAL HISTORY:   Social History  Substance Use Topics  . Smoking status: Current Some Day Smoker -- 0.50 packs/day for 40 years    Types: Cigarettes  . Smokeless tobacco: Not on file  . Alcohol Use: 0.6 oz/week    1 Cans of beer per week    FAMILY HISTORY:   Family History  Problem Relation Age of Onset  . Bladder Cancer Mother   . Lung cancer Father      DRUG ALLERGIES:  No Known Allergies  REVIEW OF SYSTEMS:   Review of Systems  Constitutional: Negative for fever and weight loss.  HENT: Negative for congestion, nosebleeds and tinnitus.   Eyes: Negative for blurred vision, double vision and redness.  Respiratory: Positive for cough, sputum production, shortness of breath and wheezing. Negative for hemoptysis.   Cardiovascular: Positive for chest pain. Negative for orthopnea, leg swelling and PND.  Gastrointestinal: Negative for nausea, vomiting, abdominal pain, diarrhea and melena.  Genitourinary: Negative for dysuria, urgency and hematuria.  Musculoskeletal: Negative for joint pain and falls.  Neurological: Positive for weakness (generalized). Negative for dizziness, tingling, sensory change, focal weakness, seizures and headaches.  Endo/Heme/Allergies: Negative for polydipsia. Does not bruise/bleed easily.  Psychiatric/Behavioral: Negative for depression and memory loss. The patient is not nervous/anxious.     MEDICATIONS AT HOME:   Prior to Admission medications   Medication Sig Start Date End Date Taking? Authorizing Provider  albuterol (PROAIR HFA) 108 (90 BASE) MCG/ACT inhaler Inhale 2 puffs into the lungs every 6 (six) hours as needed for wheezing.    Yes Historical Provider, MD  albuterol (PROVENTIL) (2.5 MG/3ML) 0.083% nebulizer solution Inhale 3 mLs into the lungs every 4 (four) hours as needed for wheezing.    Yes Historical Provider, MD  Fluticasone-Salmeterol (ADVAIR DISKUS) 500-50 MCG/DOSE AEPB Inhale 1 puff into the lungs every 12 (twelve) hours.   Yes Historical Provider,  MD  hydrochlorothiazide (HYDRODIURIL) 25 MG tablet Take 12.5 mg by mouth daily.   Yes Historical Provider, MD  lisinopril (PRINIVIL,ZESTRIL) 10 MG tablet Take 10 mg by mouth daily.   Yes Historical Provider, MD  LORazepam (ATIVAN) 0.5 MG tablet Take 0.25 mg by mouth at bedtime as needed for anxiety (for up to 10 days at at time).    Yes Historical  Provider, MD  nystatin (MYCOSTATIN) 100000 UNIT/ML suspension Take 5 mLs by mouth 4 (four) times daily. For 10 days 06/03/15 06/13/15 Yes Historical Provider, MD  potassium chloride SA (K-DUR,KLOR-CON) 20 MEQ tablet Take 20 mEq by mouth daily.   Yes Historical Provider, MD  tiotropium (SPIRIVA) 18 MCG inhalation capsule Place 18 mcg into inhaler and inhale daily.   Yes Historical Provider, MD      VITAL SIGNS:  Blood pressure 107/36, pulse 81, temperature 98.9 F (37.2 C), temperature source Oral, resp. rate 14, height  (1.626 m), weight 89.812 kg (198 lb), SpO2 94 %.  PHYSICAL EXAMINATION:  Physical Exam  GENERAL:  59 y.o.-year-old obese patient lying in the bed with no acute distress.  EYES: Pupils equal, round, reactive to light and accommodation. No scleral icterus. Extraocular muscles intact.  HEENT: Head atraumatic, normocephalic. Oropharynx and nasopharynx clear. No oropharyngeal erythema, moist oral mucosa  NECK:  Supple, no jugular venous distention. No thyroid enlargement, no tenderness.  LUNGS: Prolonged inspiratory and expiratory phase. Positive and expiratory wheezing. No rhonchi, rales. No use of accessory muscles of respiration.  CARDIOVASCULAR: S1, S2 RRR. No murmurs, rubs, gallops, clicks.  ABDOMEN: Soft, nontender, nondistended. Bowel sounds present. No organomegaly or mass.  EXTREMITIES: No pedal edema, cyanosis, or clubbing bilaterally. + 2 pedal & radial pulses b/l.   NEUROLOGIC: Cranial nerves II through XII are intact. No focal Motor or sensory deficits appreciated b/l PSYCHIATRIC: The patient is alert and oriented x 3. Good affect.  SKIN: No obvious rash, lesion, or ulcer.   LABORATORY PANEL:   CBC  Recent Labs Lab 06/09/15 0624  WBC 14.9*  HGB 15.2  HCT 46.0  PLT 261   ------------------------------------------------------------------------------------------------------------------  Chemistries   Recent Labs Lab 06/09/15 0624  NA 139  K 4.3   CL 100*  CO2 32  GLUCOSE 134*  BUN 12  CREATININE 0.92  CALCIUM 8.9  AST 27  ALT 34  ALKPHOS 57  BILITOT 0.9   ------------------------------------------------------------------------------------------------------------------  Cardiac Enzymes  Recent Labs Lab 06/09/15 0624  TROPONINI <0.03   ------------------------------------------------------------------------------------------------------------------  RADIOLOGY:  Dg Chest Port 1 View  06/09/2015   CLINICAL DATA:  Left-sided chest pain with difficulty breathing for 1 day  EXAM: PORTABLE CHEST 1 VIEW  COMPARISON:  Chest radiograph and chest CT December 10, 2012  FINDINGS: There is mild bibasilar lung scarring. There is no edema or consolidation. Heart is upper normal in size with pulmonary vascularity within normal limits. No adenopathy. No bone lesions.  IMPRESSION: Areas of bibasilar lung scarring.  No edema or consolidation.   Electronically Signed   By: Bretta Bang III M.D.   On: 06/09/2015 07:10   Ct Angio Chest Aorta W/cm &/or Wo/cm  06/09/2015   CLINICAL DATA:  Chest pain and shortness of breath for 1 day  EXAM: CT ANGIOGRAPHY CHEST WITH CONTRAST  TECHNIQUE: Initially, axial CT images were obtained through the chest without intravenous contrast material administration. Multidetector CT imaging of the chest was performed using the standard protocol during bolus administration of intravenous contrast. Multiplanar CT image reconstructions and MIPs were obtained  to evaluate the vascular anatomy.  CONTRAST:  OMNIPAQUE IOHEXOL 350 MG/ML SOLN  COMPARISON:  Chest CT December 10, 2012 and chest radiograph June 09, 2015  FINDINGS: There is no demonstrable pulmonary embolus. There is atherosclerotic change in the aortic arch region. There is no thoracic aortic aneurysm or dissection. There is mild atherosclerotic change in the visualized great vessels. Visualized great vessels otherwise appear unremarkable.  There are scattered  bullae throughout the lungs. There is scarring with atelectasis in the inferior lingula and both lower lobes. There may be a small area of superimposed pneumonia in the anterior segment left lower lobe. On axial slice 40 series 3, there is a stable 5 mm nodular opacity in the posterior segment of the right lower lobe. No new parenchymal nodular opacities are identified.  Visualized thyroid appears normal. There are small mediastinal lymph nodes without frank adenopathy by size criteria. There is left ventricular hypertrophy. There is extensive coronary artery calcification. Pericardium is not thickened.  In the visualized upper abdomen, there is a stable adenoma arising from the lateral left adrenal measuring 1.1 x 0.8 cm. There is mild atherosclerotic change in the visualized upper abdominal aorta.  There are no blastic or lytic bone lesions.  Review of the MIP images confirms the above findings.  IMPRESSION: No thoracic aortic aneurysm or dissection. No demonstrable pulmonary embolus. There is atherosclerotic change in the aorta. There is extensive coronary artery calcification.  Stable 5 mm nodular opacity right lower lobe. Areas of patchy atelectasis in both lower lobes and inferior lingula. There is questionable early pneumonia in the anterior segment of the left lower lobe.  Underlying emphysematous change, stable.  No appreciable adenopathy.  Stable benign left adrenal adenoma.   Electronically Signed   By: Bretta Bang III M.D.   On: 06/09/2015 09:54     IMPRESSION AND PLAN:   59 year old male with past medical history of hep C, hypertension, COPD with ongoing tobacco abuse, history of CHF, anxiety who presents to the hospital due to shortness of breath and chest/upper back pain and noted to be in COPD exacerbation.  #1 COPD exacerbation-this is likely cause of patient's shortness of breath and hypoxia. -Patient's CT chest done on admission showed a possible evolving left lower lobe  pneumonia. -We'll treat the patient with IV steroids, around-the-clock neb treatments, continue Symbicort. Patient is on oxygen at home. -Given his CT chest findings I will start him on IV Levaquin and follow sputum and blood cultures.  #2 chest/upper back pain-this is likely musculoskeletal in nature. I will get a x-ray of his thoracic spine. -Continue supportive care with pain control.  #3 hypertension-continue HCTZ, lisinopril.  #4 anxiety-continue as needed Ativan.    All the records are reviewed and case discussed with ED provider. Management plans discussed with the patient, family and they are in agreement.  CODE STATUS: Full  TOTAL TIME TAKING CARE OF THIS PATIENT: 45 minutes.    Houston Siren M.D on 06/09/2015 at 10:55 AM  Between 7am to 6pm - Pager - [phone]  After 6pm go to [email or web address] - password [email or web address] [facility]  [facility]  Office  [phone]  CC: Primary care physician; Rayetta Humphrey, MD

## 2015-06-09 NOTE — Progress Notes (Signed)
   06/09/15 1400  Clinical Encounter Type  Visited With Patient  Visit Type Initial  Referral From Nurse  Consult/Referral To Chaplain  Received order for Advance Directive.  Upon arrival in patient's room, pt was on the phone.  I asked him about the [facility] request when he was finished with his call.  Pt said he already has one in place, told me that's what the phone call was about.  Pt thanked me for my visit and follow-up.  [employer] Diora Bellizzi-pager [phone]

## 2015-06-10 LAB — BASIC METABOLIC PANEL
Anion gap: 9 (ref 5–15)
BUN: 22 mg/dL — AB (ref 6–20)
CO2: 27 mmol/L (ref 22–32)
Calcium: 8.4 mg/dL — ABNORMAL LOW (ref 8.9–10.3)
Chloride: 97 mmol/L — ABNORMAL LOW (ref 101–111)
Creatinine, Ser: 1.06 mg/dL (ref 0.61–1.24)
GFR calc Af Amer: >60 mL/min (ref >60)
GFR calc non Af Amer: >60 mL/min (ref >60)
GLUCOSE: 388 mg/dL — AB (ref 65–99)
POTASSIUM: 4.3 mmol/L (ref 3.5–5.1)
Sodium: 133 mmol/L — ABNORMAL LOW (ref 135–145)

## 2015-06-10 LAB — CBC
HEMATOCRIT: 42.9 % (ref 40.0–52.0)
Hemoglobin: 13.9 g/dL (ref 13.0–18.0)
MCH: 30.2 pg (ref 26.0–34.0)
MCHC: 32.3 g/dL (ref 32.0–36.0)
MCV: 93.6 fL (ref 80.0–100.0)
Platelets: 238 10*3/uL (ref 150–440)
RBC: 4.59 MIL/uL (ref 4.40–5.90)
RDW: 13.8 % (ref 11.5–14.5)
WBC: 22.1 10*3/uL — ABNORMAL HIGH (ref 3.8–10.6)

## 2015-06-10 MED ORDER — SODIUM CHLORIDE 0.9 % IV SOLN
INTRAVENOUS | Status: DC
  Administered 2015-06-10: 08:00:00 via INTRAVENOUS

## 2015-06-10 MED ORDER — LEVOFLOXACIN 750 MG PO TABS: 750.0000 mg | ORAL_TABLET | Freq: Every day | ORAL | Status: DC

## 2015-06-10 MED ORDER — PREDNISONE 10 MG PO TABS: 10.0000 mg | ORAL_TABLET | Freq: Every day | ORAL | Status: DC

## 2015-06-10 MED ORDER — LEVOFLOXACIN IN D5W 750 MG/150ML IV SOLN
750.0000 mg | INTRAVENOUS | Status: DC
  Filled 2015-06-10: qty 150

## 2015-06-10 NOTE — Plan of Care (Signed)
Problem: Discharge Progression Outcomes Goal: O2 sats > or equal 90% or at baseline Outcome: Progressing Pt on home oxygen at 3 liters per minute Goal: Home O2 if indicated Outcome: Completed/Met Date Met:  06/10/15 Pt already on home oxygen Goal: Other Discharge Outcomes/Goals Outcome: Progressing Pt admitted yesterday. Numerous health complaints. He said he has been going to the doctor with health complaints but not getting the answers he wants. On oxygen at 3 liters nasal cannula which is chronic. Expiratory wheezes. Complaints of left sided rib cage and back pain with adequate relief from two Norco.  Left elbow red, warm and swollen ( tender to touch).

## 2015-06-10 NOTE — Consult Note (Signed)
Pulmonary Critical Care  Initial Consult Note   Yutaka Holberg Bauder ZOX:[id number] DOB: [birth year] DOA: 06/09/2015  Referring physician: Encarnacion Slates, MD PCP: Rayetta Humphrey, MD   Chief Complaint: COPD with exacerbation  HPI: Kyle Rich is a 59 y.o. male with priro history of COPD being followed by Dr Meredeth Ide presented to the hospital with increased SOB.  At the time of my evaluation he is not verbal so the history is taken from the notes. Patient was last seen on 9/16 in teh office and had been having some SOB noted with cough. He is still smoking. He was given IV steroids and also a respiratory treatment at that time. He presented to the ED on 10/4 with increased SOB and also had been having some chest pain. He was also noted to have a cough and had some sputum production. He has been on a steroid taper. CT scan of the chest shows presence of atelectasis and possible early pneumonia. He is now admitted for IV abx and steroids.   Review of Systems:  Patient is not able to provide a ROS at this time  Past Medical History  Diagnosis Date  . Asthma   . CHF (congestive heart failure) (HCC)   . Hypertension   . COPD (chronic obstructive pulmonary disease) (HCC)   . Hepatitis C    History reviewed. No pertinent past surgical history. Social History:  reports that he has been smoking Cigarettes.  He has a 20 pack-year smoking history. He does not have any smokeless tobacco history on file. He reports that he drinks about 0.6 oz of alcohol per week. He reports that he uses illicit drugs (Marijuana).  No Known Allergies  Family History  Problem Relation Age of Onset  . Bladder Cancer Mother   . Lung cancer Father     Prior to Admission medications   Medication Sig Start Date End Date Taking? Authorizing Provider  albuterol (PROAIR HFA) 108 (90 BASE) MCG/ACT inhaler Inhale 2 puffs into the lungs every 6 (six) hours as needed for wheezing.    Yes Historical Provider, MD  albuterol (PROVENTIL)  (2.5 MG/3ML) 0.083% nebulizer solution Inhale 3 mLs into the lungs every 4 (four) hours as needed for wheezing.    Yes Historical Provider, MD  Fluticasone-Salmeterol (ADVAIR DISKUS) 500-50 MCG/DOSE AEPB Inhale 1 puff into the lungs every 12 (twelve) hours.   Yes Historical Provider, MD  hydrochlorothiazide (HYDRODIURIL) 25 MG tablet Take 12.5 mg by mouth daily.   Yes Historical Provider, MD  lisinopril (PRINIVIL,ZESTRIL) 10 MG tablet Take 10 mg by mouth daily.   Yes Historical Provider, MD  LORazepam (ATIVAN) 0.5 MG tablet Take 0.25 mg by mouth at bedtime as needed for anxiety (for up to 10 days at at time).    Yes Historical Provider, MD  nystatin (MYCOSTATIN) 100000 UNIT/ML suspension Take 5 mLs by mouth 4 (four) times daily. For 10 days 06/03/15 06/13/15 Yes Historical Provider, MD  potassium chloride SA (K-DUR,KLOR-CON) 20 MEQ tablet Take 20 mEq by mouth daily.   Yes Historical Provider, MD  tiotropium (SPIRIVA) 18 MCG inhalation capsule Place 18 mcg into inhaler and inhale daily.   Yes Historical Provider, MD  levofloxacin (LEVAQUIN) 750 MG tablet Take 1 tablet (750 mg total) by mouth daily. 06/10/15   Adrian Saran, MD  predniSONE (DELTASONE) 10 MG tablet Take 1 tablet (10 mg total) by mouth daily with breakfast. 06/10/15   Adrian Saran, MD   Physical Exam: Filed Vitals:   06/10/15 0007  06/10/15 0420 06/10/15 0428 06/10/15 1112  BP:   152/64   Pulse:   76   Temp:   97.5 F (36.4 C)   TempSrc:   Oral   Resp:   18   Height:      Weight:      SpO2: 96% 95% 97% 94%    Wt Readings from Last 3 Encounters:  06/09/15 89.812 kg (198 lb)    General:  Awake and non-verbal Eyes: PERRL, normal lids, irises & conjunctiva ENT: grossly normal hearing, lips & tongue Neck: no LAD, masses or thyromegaly Cardiovascular: RRR, no m/r/g. No LE edema. Respiratory: CTA bilaterally, no w/r/r. Normal respiratory effort. Abdomen: soft, nontender Skin: no rash or induration seen on limited  exam Musculoskeletal: grossly normal tone BUE/BLE Psychiatric: unable to assess Neurologic: unable to assess.          Labs on Admission:  Basic Metabolic Panel:  Recent Labs Lab 06/09/15 0624 06/10/15 0531  NA 139 133*  K 4.3 4.3  CL 100* 97*  CO2 32 27  GLUCOSE 134* 388*  BUN 12 22*  CREATININE 0.92 1.06  CALCIUM 8.9 8.4*   Liver Function Tests:  Recent Labs Lab 06/09/15 0624  AST 27  ALT 34  ALKPHOS 57  BILITOT 0.9  PROT 7.8  ALBUMIN 3.7   No results for input(s): LIPASE, AMYLASE in the last 168 hours. No results for input(s): AMMONIA in the last 168 hours. CBC:  Recent Labs Lab 06/09/15 0624 06/10/15 0531  WBC 14.9* 22.1*  HGB 15.2 13.9  HCT 46.0 42.9  MCV 93.4 93.6  PLT 261 238   Cardiac Enzymes:  Recent Labs Lab 06/09/15 0624  TROPONINI <0.03    BNP (last 3 results)  Recent Labs  06/09/15 0624  BNP 18.0    ProBNP (last 3 results) No results for input(s): PROBNP in the last 8760 hours.  CBG: No results for input(s): GLUCAP in the last 168 hours.  Radiological Exams on Admission: Dg Thoracic Spine 2 View  06/09/2015   CLINICAL DATA:  Interscapular pain, worse on the left. Shortness of breath. History of CHF and COPD. Initial encounter.  EXAM: THORACIC SPINE 2 VIEWS  COMPARISON:  Chest CTA 06/09/2015.  FINDINGS: There are 12 rib-bearing thoracic type vertebral bodies. The alignment is normal. There is no evidence of acute fracture, paraspinal hematoma or widening of the interpedicular distance. There are mild degenerative changes throughout the cervical thoracic spine.  IMPRESSION: No acute osseous findings or malalignment. Mild degenerative changes.   Electronically Signed   By: Carey Bullocks M.D.   On: 06/09/2015 11:46   Dg Chest Port 1 View  06/09/2015   CLINICAL DATA:  Left-sided chest pain with difficulty breathing for 1 day  EXAM: PORTABLE CHEST 1 VIEW  COMPARISON:  Chest radiograph and chest CT December 10, 2012  FINDINGS: There is  mild bibasilar lung scarring. There is no edema or consolidation. Heart is upper normal in size with pulmonary vascularity within normal limits. No adenopathy. No bone lesions.  IMPRESSION: Areas of bibasilar lung scarring.  No edema or consolidation.   Electronically Signed   By: Bretta Bang III M.D.   On: 06/09/2015 07:10   Ct Angio Chest Aorta W/cm &/or Wo/cm  06/09/2015   CLINICAL DATA:  Chest pain and shortness of breath for 1 day  EXAM: CT ANGIOGRAPHY CHEST WITH CONTRAST  TECHNIQUE: Initially, axial CT images were obtained through the chest without intravenous contrast material administration. Multidetector CT imaging of  the chest was performed using the standard protocol during bolus administration of intravenous contrast. Multiplanar CT image reconstructions and MIPs were obtained to evaluate the vascular anatomy.  CONTRAST:  OMNIPAQUE IOHEXOL 350 MG/ML SOLN  COMPARISON:  Chest CT December 10, 2012 and chest radiograph June 09, 2015  FINDINGS: There is no demonstrable pulmonary embolus. There is atherosclerotic change in the aortic arch region. There is no thoracic aortic aneurysm or dissection. There is mild atherosclerotic change in the visualized great vessels. Visualized great vessels otherwise appear unremarkable.  There are scattered bullae throughout the lungs. There is scarring with atelectasis in the inferior lingula and both lower lobes. There may be a small area of superimposed pneumonia in the anterior segment left lower lobe. On axial slice 40 series 3, there is a stable 5 mm nodular opacity in the posterior segment of the right lower lobe. No new parenchymal nodular opacities are identified.  Visualized thyroid appears normal. There are small mediastinal lymph nodes without frank adenopathy by size criteria. There is left ventricular hypertrophy. There is extensive coronary artery calcification. Pericardium is not thickened.  In the visualized upper abdomen, there is a stable  adenoma arising from the lateral left adrenal measuring 1.1 x 0.8 cm. There is mild atherosclerotic change in the visualized upper abdominal aorta.  There are no blastic or lytic bone lesions.  Review of the MIP images confirms the above findings.  IMPRESSION: No thoracic aortic aneurysm or dissection. No demonstrable pulmonary embolus. There is atherosclerotic change in the aorta. There is extensive coronary artery calcification.  Stable 5 mm nodular opacity right lower lobe. Areas of patchy atelectasis in both lower lobes and inferior lingula. There is questionable early pneumonia in the anterior segment of the left lower lobe.  Underlying emphysematous change, stable.  No appreciable adenopathy.  Stable benign left adrenal adenoma.   Electronically Signed   By: Bretta Bang III M.D.   On: 06/09/2015 09:54      Assessment/Plan Active Problems:   COPD exacerbation (HCC)   1. Possible pneumonia -currently the patient is on levaquin which will be continued -f/u CXR as needed -titrate oxygen as needed  2. COPD with exacerbation -continue with inhalers as your are -wean IV steroids as tolerated     I have personally obtained a history, examined the patient, evaluated laboratory and imaging results, formulated the assessment and plan and placed orders.  The Patient requires high complexity decision making for assessment and support.    Yevonne Pax, MD [facility] Pulmonary Critical Care Medicine Sleep Medicine

## 2015-06-10 NOTE — Care Management (Signed)
Admitted to [facility] with the diagnosis of COPD. Lives with mother, daughter and son-in-law. Mother is Glean Salvo Sonnenfeld [phone]) and daughter is Rafi Kenneth [phone]).  Last seen Dr. Greggory Stallion ar [facility] 1 week ago. Long term oxygen in the home for many years. No Home Health. No skilled facility. Uses a rolling walker to aid in ambulation. No falls. Good appetite. States he has no transportation. States he takes care of all basic activities of daily living himself. Cooks his meals. Get $100.00 in food stamps per month. Would like someone to come in and help with personnal services. Discussed that these services would have to be paid out of pocket. "Can't afford services."  Discussed Assisted Living services. Possible will consider this after his mother passes.  Will give information about [facility] and [facility]. Discharge to home today per Dr. Juliene Pina. Friend will transport. Gwenette Greet RN MSN Care Management [phone]

## 2015-06-10 NOTE — Discharge Summary (Signed)
[facility] [place] at [facility]   PATIENT NAME: Kyle Rich    MR#:  [id number]  DATE OF BIRTH:  May 23, 1956  DATE OF ADMISSION:  06/09/2015 ADMITTING PHYSICIAN: Houston Siren, MD  DATE OF DISCHARGE: 06/10/2015  PRIMARY CARE PHYSICIAN: Rayetta Humphrey, MD    ADMISSION DIAGNOSIS:  Shortness of breath [R06.02] Back pain [M54.9] Respiratory distress [R06.00] SOB (shortness of breath) [R06.02] Bronchitis [J40] Chest pain [R07.9]  DISCHARGE DIAGNOSIS:  Active Problems:   COPD exacerbation (HCC)   SECONDARY DIAGNOSIS:   Past Medical History  Diagnosis Date  . Asthma   . CHF (congestive heart failure) (HCC)   . Hypertension   . COPD (chronic obstructive pulmonary disease) (HCC)   . Hepatitis C     HOSPITAL COURSE:  59 year old male with history of hepatitis C, essential hypertension and COPD with chronic respiratory failure who presents with COPD exacerbation and CAPD. Further details please refer to H&P.  1. Acute on chronic hypoxic respiratory failure: This is secondary to community-acquired pneumonia as well as COPD exacerbation. Patient is back on his home oxygen of 3-4 L. CT scan of the chest on admission showed possibly evolving left lower lobe pneumonia. Patient was treated for COPD exacerbation as well as community acquired pneumonia.  2. Community acquired pneumonia: Patient continue with Levaquin antibiotics for a total of 5 days of treatment.  3. Acute COPD exacerbation: Patient subjectively is much better. His symptoms have improved. His lung examination at discharge shows no wheezing. He is speaking full sentences. He will continue with antibiotics as well as prednisone taper. He should continue his outpatient oxygen and inhaler.  4. Essential hypertension: Patient will continue on HCTZ and lisinopril  5. Anxiety: Continue when necessary Ativan  6. Tobacco dependence: Patient is encouraged to stop smoking. Patient is counseled for  3 minutes regarding this.  7. Hyponatremia: This is likely secondary to COPD and pneumonia.  8. Leukocytosis: Patient's white blood cell count was elevated this is due to the steroids.  DISCHARGE CONDITIONS AND DIET:  Patient seen discharged home in stable condition on a heart healthy diet  CONSULTS OBTAINED:     DRUG ALLERGIES:  No Known Allergies  DISCHARGE MEDICATIONS:   Current Discharge Medication List    START taking these medications   Details  levofloxacin (LEVAQUIN) 750 MG tablet Take 1 tablet (750 mg total) by mouth daily. Qty: 5 tablet, Refills: 0    predniSONE (DELTASONE) 10 MG tablet Take 1 tablet (10 mg total) by mouth daily with breakfast. Qty: 42 tablet, Refills: 0      CONTINUE these medications which have NOT CHANGED   Details  albuterol (PROAIR HFA) 108 (90 BASE) MCG/ACT inhaler Inhale 2 puffs into the lungs every 6 (six) hours as needed for wheezing.     albuterol (PROVENTIL) (2.5 MG/3ML) 0.083% nebulizer solution Inhale 3 mLs into the lungs every 4 (four) hours as needed for wheezing.     Fluticasone-Salmeterol (ADVAIR DISKUS) 500-50 MCG/DOSE AEPB Inhale 1 puff into the lungs every 12 (twelve) hours.    hydrochlorothiazide (HYDRODIURIL) 25 MG tablet Take 12.5 mg by mouth daily.    lisinopril (PRINIVIL,ZESTRIL) 10 MG tablet Take 10 mg by mouth daily.    LORazepam (ATIVAN) 0.5 MG tablet Take 0.25 mg by mouth at bedtime as needed for anxiety (for up to 10 days at at time).     nystatin (MYCOSTATIN) 100000 UNIT/ML suspension Take 5 mLs by mouth 4 (four) times daily. For 10 days  potassium chloride SA (K-DUR,KLOR-CON) 20 MEQ tablet Take 20 mEq by mouth daily.    tiotropium (SPIRIVA) 18 MCG inhalation capsule Place 18 mcg into inhaler and inhale daily.              Today   CHIEF COMPLAINT:  Patient is doing well this morning. Patient would like to go home. Patient is back on his home oxygen.   VITAL SIGNS:  Blood pressure 152/64, pulse  76, temperature 97.5 F (36.4 C), temperature source Oral, resp. rate 18, height 5\' 4"  (1.626 m), weight 89.812 kg (198 lb), SpO2 97 %.   REVIEW OF SYSTEMS:  Review of Systems  Constitutional: Negative for fever, chills and malaise/fatigue.  HENT: Negative for sore throat.   Eyes: Negative for blurred vision.  Respiratory: Negative for cough, hemoptysis, sputum production, shortness of breath and wheezing.   Cardiovascular: Negative for chest pain, palpitations and leg swelling.  Gastrointestinal: Negative for nausea, vomiting, abdominal pain, diarrhea and blood in stool.  Genitourinary: Negative for dysuria.  Musculoskeletal: Negative for back pain.  Neurological: Negative for dizziness, tremors and headaches.  Endo/Heme/Allergies: Does not bruise/bleed easily.     PHYSICAL EXAMINATION:  GENERAL:  59 y.o.-year-old patient lying in the bed with no acute distress.  NECK:  Supple, no jugular venous distention. No thyroid enlargement, no tenderness.  LUNGS: Normal breath sounds bilaterally, no wheezing, rales,rhonchi  No use of accessory muscles of respiration.  CARDIOVASCULAR: S1, S2 normal. No murmurs, rubs, or gallops.  ABDOMEN: Soft, non-tender, non-distended. Bowel sounds present. No organomegaly or mass.  EXTREMITIES: No pedal edema, cyanosis, or clubbing.  PSYCHIATRIC: The patient is alert and oriented x 3.  SKIN: No obvious rash, lesion, or ulcer.   DATA REVIEW:   CBC  Recent Labs Lab 06/10/15 0531  WBC 22.1*  HGB 13.9  HCT 42.9  PLT 238    Chemistries   Recent Labs Lab 06/09/15 0624 06/10/15 0531  NA 139 133*  K 4.3 4.3  CL 100* 97*  CO2 32 27  GLUCOSE 134* 388*  BUN 12 22*  CREATININE 0.92 1.06  CALCIUM 8.9 8.4*  AST 27  --   ALT 34  --   ALKPHOS 57  --   BILITOT 0.9  --     Cardiac Enzymes  Recent Labs Lab 06/09/15 0624  TROPONINI <0.03    Microbiology Results  @[email or web address] @  RADIOLOGY:  Dg Thoracic Spine 2 View  06/09/2015    CLINICAL DATA:  Interscapular pain, worse on the left. Shortness of breath. History of CHF and COPD. Initial encounter.  EXAM: THORACIC SPINE 2 VIEWS  COMPARISON:  Chest CTA 06/09/2015.  FINDINGS: There are 12 rib-bearing thoracic type vertebral bodies. The alignment is normal. There is no evidence of acute fracture, paraspinal hematoma or widening of the interpedicular distance. There are mild degenerative changes throughout the cervical thoracic spine.  IMPRESSION: No acute osseous findings or malalignment. Mild degenerative changes.   Electronically Signed   By: Carey Bullocks M.D.   On: 06/09/2015 11:46   Dg Chest Port 1 View  06/09/2015   CLINICAL DATA:  Left-sided chest pain with difficulty breathing for 1 day  EXAM: PORTABLE CHEST 1 VIEW  COMPARISON:  Chest radiograph and chest CT December 10, 2012  FINDINGS: There is mild bibasilar lung scarring. There is no edema or consolidation. Heart is upper normal in size with pulmonary vascularity within normal limits. No adenopathy. No bone lesions.  IMPRESSION: Areas of bibasilar lung scarring.  No edema or  consolidation.   Electronically Signed   By: Bretta Bang III M.D.   On: 06/09/2015 07:10   Ct Angio Chest Aorta W/cm &/or Wo/cm  06/09/2015   CLINICAL DATA:  Chest pain and shortness of breath for 1 day  EXAM: CT ANGIOGRAPHY CHEST WITH CONTRAST  TECHNIQUE: Initially, axial CT images were obtained through the chest without intravenous contrast material administration. Multidetector CT imaging of the chest was performed using the standard protocol during bolus administration of intravenous contrast. Multiplanar CT image reconstructions and MIPs were obtained to evaluate the vascular anatomy.  CONTRAST:  OMNIPAQUE IOHEXOL 350 MG/ML SOLN  COMPARISON:  Chest CT December 10, 2012 and chest radiograph June 09, 2015  FINDINGS: There is no demonstrable pulmonary embolus. There is atherosclerotic change in the aortic arch region. There is no thoracic aortic  aneurysm or dissection. There is mild atherosclerotic change in the visualized great vessels. Visualized great vessels otherwise appear unremarkable.  There are scattered bullae throughout the lungs. There is scarring with atelectasis in the inferior lingula and both lower lobes. There may be a small area of superimposed pneumonia in the anterior segment left lower lobe. On axial slice 40 series 3, there is a stable 5 mm nodular opacity in the posterior segment of the right lower lobe. No new parenchymal nodular opacities are identified.  Visualized thyroid appears normal. There are small mediastinal lymph nodes without frank adenopathy by size criteria. There is left ventricular hypertrophy. There is extensive coronary artery calcification. Pericardium is not thickened.  In the visualized upper abdomen, there is a stable adenoma arising from the lateral left adrenal measuring 1.1 x 0.8 cm. There is mild atherosclerotic change in the visualized upper abdominal aorta.  There are no blastic or lytic bone lesions.  Review of the MIP images confirms the above findings.  IMPRESSION: No thoracic aortic aneurysm or dissection. No demonstrable pulmonary embolus. There is atherosclerotic change in the aorta. There is extensive coronary artery calcification.  Stable 5 mm nodular opacity right lower lobe. Areas of patchy atelectasis in both lower lobes and inferior lingula. There is questionable early pneumonia in the anterior segment of the left lower lobe.  Underlying emphysematous change, stable.  No appreciable adenopathy.  Stable benign left adrenal adenoma.   Electronically Signed   By: Bretta Bang III M.D.   On: 06/09/2015 09:54      Management plans discussed with the patient and he is in agreement. Stable for discharge home  Patient should follow up with PCP  CODE STATUS:     Code Status Orders        Start     Ordered   06/09/15 1200  Full code   Continuous     06/09/15 1159      TOTAL  TIME TAKING CARE OF THIS PATIENT: 35 minutes.    Riverlyn Kizziah M.D on 06/10/2015 at 10:27 AM  Between 7am to 6pm - Pager - [phone] After 6pm go to [email or web address] - password [email or web address]  [place] [facility]  Office  [phone]  CC: Primary care physician; Rayetta Humphrey, MD

## 2015-11-25 ENCOUNTER — Ambulatory Visit: Admission: RE | Admit: 2015-11-25 | Payer: Medicaid Other | Source: Ambulatory Visit | Admitting: Specialist

## 2016-06-15 ENCOUNTER — Encounter: Payer: Self-pay | Admitting: Emergency Medicine

## 2016-06-15 ENCOUNTER — Emergency Department: Payer: Medicaid Other

## 2016-06-15 ENCOUNTER — Encounter
Admission: EM | Disposition: A | Discharge status: Other Acute Inpatient Hospital | Payer: Self-pay | Source: Home / Self Care | Attending: Internal Medicine

## 2016-06-15 ENCOUNTER — Inpatient Hospital Stay
Admission: EM | Admit: 2016-06-15 | Discharge: 2016-06-15 | DRG: 281 | Disposition: A | Discharge status: Other Acute Inpatient Hospital | Payer: Medicaid Other | Attending: Internal Medicine | Admitting: Internal Medicine

## 2016-06-15 ENCOUNTER — Ambulatory Visit (HOSPITAL_COMMUNITY)
Admission: AD | Admit: 2016-06-15 | Discharge: 2016-06-15 | Disposition: A | Discharge status: Home / Self Care | Payer: Medicaid Other | Source: Other Acute Inpatient Hospital | Attending: Internal Medicine | Admitting: Internal Medicine

## 2016-06-15 DIAGNOSIS — E1151 Type 2 diabetes mellitus with diabetic peripheral angiopathy without gangrene: Secondary | ICD-10-CM | POA: Diagnosis present

## 2016-06-15 DIAGNOSIS — Z8052 Family history of malignant neoplasm of bladder: Secondary | ICD-10-CM

## 2016-06-15 DIAGNOSIS — M1990 Unspecified osteoarthritis, unspecified site: Secondary | ICD-10-CM | POA: Diagnosis present

## 2016-06-15 DIAGNOSIS — I25110 Atherosclerotic heart disease of native coronary artery with unstable angina pectoris: Secondary | ICD-10-CM | POA: Diagnosis present

## 2016-06-15 DIAGNOSIS — Z9981 Dependence on supplemental oxygen: Secondary | ICD-10-CM | POA: Diagnosis not present

## 2016-06-15 DIAGNOSIS — F17210 Nicotine dependence, cigarettes, uncomplicated: Secondary | ICD-10-CM | POA: Diagnosis present

## 2016-06-15 DIAGNOSIS — D72829 Elevated white blood cell count, unspecified: Secondary | ICD-10-CM | POA: Diagnosis present

## 2016-06-15 DIAGNOSIS — I5020 Unspecified systolic (congestive) heart failure: Secondary | ICD-10-CM | POA: Diagnosis present

## 2016-06-15 DIAGNOSIS — R0602 Shortness of breath: Secondary | ICD-10-CM | POA: Diagnosis present

## 2016-06-15 DIAGNOSIS — J989 Respiratory disorder, unspecified: Secondary | ICD-10-CM | POA: Diagnosis present

## 2016-06-15 DIAGNOSIS — I110 Hypertensive heart disease with heart failure: Secondary | ICD-10-CM | POA: Diagnosis present

## 2016-06-15 DIAGNOSIS — Z801 Family history of malignant neoplasm of trachea, bronchus and lung: Secondary | ICD-10-CM | POA: Diagnosis not present

## 2016-06-15 DIAGNOSIS — B1920 Unspecified viral hepatitis C without hepatic coma: Secondary | ICD-10-CM | POA: Diagnosis present

## 2016-06-15 DIAGNOSIS — I214 Non-ST elevation (NSTEMI) myocardial infarction: Secondary | ICD-10-CM | POA: Diagnosis present

## 2016-06-15 DIAGNOSIS — J441 Chronic obstructive pulmonary disease with (acute) exacerbation: Secondary | ICD-10-CM | POA: Diagnosis present

## 2016-06-15 DIAGNOSIS — J9611 Chronic respiratory failure with hypoxia: Secondary | ICD-10-CM | POA: Diagnosis present

## 2016-06-15 DIAGNOSIS — Z79899 Other long term (current) drug therapy: Secondary | ICD-10-CM

## 2016-06-15 HISTORY — PX: CARDIAC CATHETERIZATION: SHX172

## 2016-06-15 HISTORY — DX: Type 2 diabetes mellitus without complications: E11.9

## 2016-06-15 HISTORY — DX: Unspecified osteoarthritis, unspecified site: M19.90

## 2016-06-15 LAB — GLUCOSE, CAPILLARY
GLUCOSE-CAPILLARY: 164 mg/dL — AB (ref 65–99)
Glucose-Capillary: 155 mg/dL — ABNORMAL HIGH (ref 65–99)
Glucose-Capillary: 175 mg/dL — ABNORMAL HIGH (ref 65–99)

## 2016-06-15 LAB — BASIC METABOLIC PANEL
ANION GAP: 8 (ref 5–15)
BUN: 12 mg/dL (ref 6–20)
CALCIUM: 8.5 mg/dL — AB (ref 8.9–10.3)
CO2: 26 mmol/L (ref 22–32)
Chloride: 104 mmol/L (ref 101–111)
Creatinine, Ser: 0.86 mg/dL (ref 0.61–1.24)
Glucose, Bld: 136 mg/dL — ABNORMAL HIGH (ref 65–99)
POTASSIUM: 4.3 mmol/L (ref 3.5–5.1)
Sodium: 138 mmol/L (ref 135–145)

## 2016-06-15 LAB — BLOOD GAS, VENOUS
ACID-BASE EXCESS: 2.6 mmol/L — AB (ref 0.0–2.0)
BICARBONATE: 28.9 mmol/L — AB (ref 20.0–28.0)
O2 Saturation: 92.7 %
PCO2 VEN: 50 mmHg (ref 44.0–60.0)
PH VEN: 7.37 (ref 7.250–7.430)
PO2 VEN: 68 mmHg — AB (ref 32.0–45.0)
Patient temperature: 37

## 2016-06-15 LAB — PROTIME-INR

## 2016-06-15 LAB — MAGNESIUM: MAGNESIUM: 2.1 mg/dL (ref 1.7–2.4)

## 2016-06-15 LAB — FIBRIN DERIVATIVES D-DIMER (ARMC ONLY): Fibrin derivatives D-dimer (ARMC): 1022 — ABNORMAL HIGH (ref 0–499)

## 2016-06-15 LAB — CBC
HEMATOCRIT: 42.9 % (ref 40.0–52.0)
HEMOGLOBIN: 14.2 g/dL (ref 13.0–18.0)
MCH: 30.3 pg (ref 26.0–34.0)
MCHC: 33.2 g/dL (ref 32.0–36.0)
MCV: 91.4 fL (ref 80.0–100.0)
Platelets: 279 10*3/uL (ref 150–440)
RBC: 4.69 MIL/uL (ref 4.40–5.90)
RDW: 14.4 % (ref 11.5–14.5)
WBC: 13.5 10*3/uL — AB (ref 3.8–10.6)

## 2016-06-15 LAB — TROPONIN I
TROPONIN I: 0.45 ng/mL — AB (ref <0.03)
Troponin I: 0.4 ng/mL (ref <0.03)
Troponin I: 0.38 ng/mL (ref <0.03)

## 2016-06-15 LAB — CARDIAC CATHETERIZATION: Cath EF Quantitative: 35 %

## 2016-06-15 SURGERY — LEFT HEART CATH AND CORONARY ANGIOGRAPHY
Anesthesia: Moderate Sedation

## 2016-06-15 MED ORDER — MIDAZOLAM HCL 2 MG/2ML IJ SOLN
INTRAMUSCULAR | Status: AC
  Filled 2016-06-15: qty 2

## 2016-06-15 MED ORDER — LEVALBUTEROL HCL 1.25 MG/0.5ML IN NEBU: 1.2500 mg | INHALATION_SOLUTION | Freq: Four times a day (QID) | RESPIRATORY_TRACT | Status: DC

## 2016-06-15 MED ORDER — IOPAMIDOL (ISOVUE-300) INJECTION 61%
INTRAVENOUS | Status: DC | PRN
  Administered 2016-06-15: 120 mL via INTRA_ARTERIAL

## 2016-06-15 MED ORDER — LISINOPRIL 10 MG PO TABS
10.0000 mg | ORAL_TABLET | Freq: Every day | ORAL | Status: DC
  Administered 2016-06-15: 10 mg via ORAL
  Filled 2016-06-15: qty 1

## 2016-06-15 MED ORDER — ATORVASTATIN CALCIUM 40 MG PO TABS: 40.0000 mg | ORAL_TABLET | Freq: Every day | ORAL | Status: DC

## 2016-06-15 MED ORDER — SODIUM CHLORIDE 0.9% FLUSH: 3.0000 mL | INTRAVENOUS | Status: DC | PRN

## 2016-06-15 MED ORDER — NITROGLYCERIN 0.4 MG SL SUBL: 0.4000 mg | SUBLINGUAL_TABLET | SUBLINGUAL | Status: DC | PRN

## 2016-06-15 MED ORDER — NITROGLYCERIN 0.4 MG SL SUBL
0.4000 mg | SUBLINGUAL_TABLET | SUBLINGUAL | Status: DC | PRN
  Administered 2016-06-15 (×3): 0.4 mg via SUBLINGUAL
  Filled 2016-06-15 (×3): qty 1

## 2016-06-15 MED ORDER — MORPHINE SULFATE (PF) 4 MG/ML IV SOLN
4.0000 mg | Freq: Once | INTRAVENOUS | Status: AC
  Administered 2016-06-15: 4 mg via INTRAVENOUS
  Filled 2016-06-15: qty 1

## 2016-06-15 MED ORDER — MORPHINE SULFATE (PF) 4 MG/ML IV SOLN
4.0000 mg | Freq: Once | INTRAVENOUS | Status: AC
  Administered 2016-06-15: 4 mg via INTRAVENOUS

## 2016-06-15 MED ORDER — METOPROLOL TARTRATE 25 MG PO TABS
25.0000 mg | ORAL_TABLET | Freq: Two times a day (BID) | ORAL | Status: DC
  Administered 2016-06-15: 25 mg via ORAL
  Filled 2016-06-15: qty 1

## 2016-06-15 MED ORDER — SODIUM CHLORIDE 0.9 % WEIGHT BASED INFUSION
1.0000 mL/kg/h | INTRAVENOUS | Status: DC
  Administered 2016-06-15: 1 mL/kg/h via INTRAVENOUS

## 2016-06-15 MED ORDER — IPRATROPIUM-ALBUTEROL 0.5-2.5 (3) MG/3ML IN SOLN
RESPIRATORY_TRACT | Status: AC
  Administered 2016-06-15: 6 mL via RESPIRATORY_TRACT
  Filled 2016-06-15: qty 6

## 2016-06-15 MED ORDER — ATORVASTATIN CALCIUM 20 MG PO TABS
40.0000 mg | ORAL_TABLET | Freq: Every day | ORAL | Status: DC
  Administered 2016-06-15: 40 mg via ORAL
  Filled 2016-06-15: qty 2

## 2016-06-15 MED ORDER — NITROGLYCERIN 2 % TD OINT
1.0000 [in_us] | TOPICAL_OINTMENT | Freq: Once | TRANSDERMAL | Status: AC
  Administered 2016-06-15: 1 [in_us] via TOPICAL

## 2016-06-15 MED ORDER — NICOTINE 14 MG/24HR TD PT24
14.0000 mg | MEDICATED_PATCH | Freq: Every day | TRANSDERMAL | Status: DC
  Administered 2016-06-15: 14 mg via TRANSDERMAL
  Filled 2016-06-15: qty 1

## 2016-06-15 MED ORDER — ASPIRIN 81 MG PO CHEW: 324.0000 mg | CHEWABLE_TABLET | ORAL | Status: DC

## 2016-06-15 MED ORDER — IPRATROPIUM-ALBUTEROL 0.5-2.5 (3) MG/3ML IN SOLN
3.0000 mL | Freq: Four times a day (QID) | RESPIRATORY_TRACT | Status: DC
  Administered 2016-06-15: 3 mL via RESPIRATORY_TRACT

## 2016-06-15 MED ORDER — METOPROLOL TARTRATE 25 MG PO TABS: 25.0000 mg | ORAL_TABLET | Freq: Two times a day (BID) | ORAL | Status: DC

## 2016-06-15 MED ORDER — MORPHINE SULFATE (PF) 4 MG/ML IV SOLN
INTRAVENOUS | Status: AC
  Filled 2016-06-15: qty 1

## 2016-06-15 MED ORDER — ASPIRIN EC 81 MG PO TBEC: 81.0000 mg | DELAYED_RELEASE_TABLET | Freq: Every day | ORAL | Status: DC

## 2016-06-15 MED ORDER — ASPIRIN 325 MG PO TBEC: 325.0000 mg | DELAYED_RELEASE_TABLET | Freq: Every day | ORAL | 0 refills | Status: DC

## 2016-06-15 MED ORDER — LEVALBUTEROL HCL 1.25 MG/0.5ML IN NEBU
1.2500 mg | INHALATION_SOLUTION | Freq: Four times a day (QID) | RESPIRATORY_TRACT | Status: DC
  Administered 2016-06-15: 1.25 mg via RESPIRATORY_TRACT
  Filled 2016-06-15 (×2): qty 0.5

## 2016-06-15 MED ORDER — FENTANYL CITRATE (PF) 100 MCG/2ML IJ SOLN
INTRAMUSCULAR | Status: DC | PRN
  Administered 2016-06-15: 25 ug via INTRAVENOUS

## 2016-06-15 MED ORDER — METHYLPREDNISOLONE SODIUM SUCC 125 MG IJ SOLR
60.0000 mg | Freq: Four times a day (QID) | INTRAMUSCULAR | Status: DC
  Administered 2016-06-15 (×2): 60 mg via INTRAVENOUS
  Filled 2016-06-15 (×2): qty 2

## 2016-06-15 MED ORDER — ACETAMINOPHEN 325 MG PO TABS: 650.0000 mg | ORAL_TABLET | ORAL | Status: DC | PRN

## 2016-06-15 MED ORDER — HEPARIN BOLUS VIA INFUSION
4000.0000 [IU] | Freq: Once | INTRAVENOUS | Status: AC
  Administered 2016-06-15: 4000 [IU] via INTRAVENOUS
  Filled 2016-06-15: qty 4000

## 2016-06-15 MED ORDER — SODIUM CHLORIDE 0.9% FLUSH: 3.0000 mL | Freq: Two times a day (BID) | INTRAVENOUS | Status: DC

## 2016-06-15 MED ORDER — TIOTROPIUM BROMIDE MONOHYDRATE 18 MCG IN CAPS
18.0000 ug | ORAL_CAPSULE | Freq: Every day | RESPIRATORY_TRACT | Status: DC
Start: 2016-06-15 — End: 2016-06-15
  Administered 2016-06-15: 18 ug via RESPIRATORY_TRACT
  Filled 2016-06-15: qty 5

## 2016-06-15 MED ORDER — INSULIN ASPART 100 UNIT/ML ~~LOC~~ SOLN
0.0000 [IU] | Freq: Three times a day (TID) | SUBCUTANEOUS | Status: DC
  Administered 2016-06-15 (×2): 2 [IU] via SUBCUTANEOUS
  Filled 2016-06-15 (×2): qty 2

## 2016-06-15 MED ORDER — METHYLPREDNISOLONE SODIUM SUCC 125 MG IJ SOLR
125.0000 mg | Freq: Once | INTRAMUSCULAR | Status: AC
  Administered 2016-06-15: 125 mg via INTRAVENOUS
  Filled 2016-06-15: qty 2

## 2016-06-15 MED ORDER — SODIUM CHLORIDE 0.9% FLUSH
3.0000 mL | Freq: Two times a day (BID) | INTRAVENOUS | Status: DC
  Administered 2016-06-15: 3 mL via INTRAVENOUS

## 2016-06-15 MED ORDER — ASPIRIN 300 MG RE SUPP
300.0000 mg | RECTAL | Status: DC
  Filled 2016-06-15: qty 1

## 2016-06-15 MED ORDER — SODIUM CHLORIDE 0.9 % WEIGHT BASED INFUSION: 100.0000 mL/h | INTRAVENOUS | Status: AC

## 2016-06-15 MED ORDER — SODIUM CHLORIDE 0.9 % WEIGHT BASED INFUSION
3.0000 mL/kg/h | INTRAVENOUS | Status: DC
Start: 2016-06-15 — End: 2016-06-15

## 2016-06-15 MED ORDER — SODIUM CHLORIDE 0.9 % WEIGHT BASED INFUSION: 3.0000 mL/kg/h | INTRAVENOUS | Status: DC

## 2016-06-15 MED ORDER — MIDAZOLAM HCL 2 MG/2ML IJ SOLN
INTRAMUSCULAR | Status: DC | PRN
  Administered 2016-06-15: 2 mg via INTRAVENOUS

## 2016-06-15 MED ORDER — NITROGLYCERIN 2 % TD OINT
TOPICAL_OINTMENT | TRANSDERMAL | Status: AC
  Filled 2016-06-15: qty 1

## 2016-06-15 MED ORDER — ALPRAZOLAM 0.25 MG PO TABS: 0.2500 mg | ORAL_TABLET | Freq: Two times a day (BID) | ORAL | Status: DC | PRN

## 2016-06-15 MED ORDER — FENTANYL CITRATE (PF) 100 MCG/2ML IJ SOLN
INTRAMUSCULAR | Status: AC
  Filled 2016-06-15: qty 2

## 2016-06-15 MED ORDER — SODIUM CHLORIDE 0.9 % IV SOLN: 250.0000 mL | INTRAVENOUS | Status: DC | PRN

## 2016-06-15 MED ORDER — IOPAMIDOL (ISOVUE-370) INJECTION 76%
75.0000 mL | Freq: Once | INTRAVENOUS | Status: AC | PRN
  Administered 2016-06-15: 75 mL via INTRAVENOUS

## 2016-06-15 MED ORDER — OXYCODONE-ACETAMINOPHEN 5-325 MG PO TABS: 1.0000 | ORAL_TABLET | Freq: Four times a day (QID) | ORAL | Status: DC | PRN

## 2016-06-15 MED ORDER — GUAIFENESIN-DM 100-10 MG/5ML PO SYRP
5.0000 mL | ORAL_SOLUTION | ORAL | Status: DC | PRN
  Administered 2016-06-15: 5 mL via ORAL
  Filled 2016-06-15: qty 5

## 2016-06-15 MED ORDER — SODIUM CHLORIDE 0.9 % WEIGHT BASED INFUSION: 1.0000 mL/kg/h | INTRAVENOUS | Status: DC

## 2016-06-15 MED ORDER — MORPHINE SULFATE (PF) 4 MG/ML IV SOLN: 4.0000 mg | INTRAVENOUS | Status: DC | PRN

## 2016-06-15 MED ORDER — ASPIRIN 81 MG PO CHEW: 81.0000 mg | CHEWABLE_TABLET | ORAL | Status: DC

## 2016-06-15 MED ORDER — HEPARIN (PORCINE) IN NACL 100-0.45 UNIT/ML-% IJ SOLN
900.0000 [IU]/h | INTRAMUSCULAR | Status: DC
  Administered 2016-06-15: 900 [IU]/h via INTRAVENOUS
  Filled 2016-06-15: qty 250

## 2016-06-15 MED ORDER — ONDANSETRON HCL 4 MG/2ML IJ SOLN: 4.0000 mg | Freq: Four times a day (QID) | INTRAMUSCULAR | Status: DC | PRN

## 2016-06-15 MED ORDER — INSULIN ASPART 100 UNIT/ML ~~LOC~~ SOLN: 0.0000 [IU] | Freq: Every day | SUBCUTANEOUS | Status: DC

## 2016-06-15 MED ORDER — ASPIRIN EC 325 MG PO TBEC: 325.0000 mg | DELAYED_RELEASE_TABLET | Freq: Every day | ORAL | Status: DC

## 2016-06-15 MED ORDER — IPRATROPIUM-ALBUTEROL 0.5-2.5 (3) MG/3ML IN SOLN
6.0000 mL | Freq: Once | RESPIRATORY_TRACT | Status: AC
  Administered 2016-06-15: 6 mL via RESPIRATORY_TRACT

## 2016-06-15 MED ORDER — HEPARIN (PORCINE) IN NACL 2-0.9 UNIT/ML-% IJ SOLN
INTRAMUSCULAR | Status: AC
  Filled 2016-06-15: qty 500

## 2016-06-15 MED ORDER — IPRATROPIUM-ALBUTEROL 0.5-2.5 (3) MG/3ML IN SOLN
RESPIRATORY_TRACT | Status: AC
  Filled 2016-06-15: qty 3

## 2016-06-15 SURGICAL SUPPLY — 10 items
CATH 5FR JL4 DIAGNOSTIC (CATHETERS) ×3 IMPLANT
CATH 5FR PIGTAIL DIAGNOSTIC (CATHETERS) ×2 IMPLANT
CATH INFINITI JR4 5F (CATHETERS) ×3 IMPLANT
DEVICE CLOSURE MYNXGRIP 5F (Vascular Products) ×3 IMPLANT
DEVICE SAFEGUARD 24CM (GAUZE/BANDAGES/DRESSINGS) ×3 IMPLANT
KIT MANI 3VAL PERCEP (MISCELLANEOUS) ×3 IMPLANT
NEEDLE PERC 18GX7CM (NEEDLE) ×3 IMPLANT
PACK CARDIAC CATH (CUSTOM PROCEDURE TRAY) ×3 IMPLANT
SHEATH AVANTI 5FR X 11CM (SHEATH) ×3 IMPLANT
WIRE EMERALD 3MM-J .035X150CM (WIRE) ×3 IMPLANT

## 2016-06-15 NOTE — Progress Notes (Signed)
ANTICOAGULATION CONSULT NOTE - Initial Consult  Pharmacy Consult for Heparin Drip Indication: chest pain/ACS  No Known Allergies  Patient Measurements: Height: 5\' 4"  (162.6 cm) Weight: 198 lb (89.8 kg) IBW/kg (Calculated) : 59.2 Heparin Dosing Weight: 78.74 kg  Vital Signs: Temp: 98.4 F (36.9 C) (10/11 0608) Temp Source: Oral (10/11 0608) BP: 151/83 (10/11 0930) Pulse Rate: 89 (10/11 0930)  Labs:  Recent Labs  06/15/16 0605 06/15/16 0920  HGB 14.2  --   HCT 42.9  --   PLT 279  --   LABPROT  --  12.8  INR  --  0.96  CREATININE 0.86  --   TROPONINI 0.45*  --     Estimated Creatinine Clearance: 89.9 mL/min (by C-G formula based on SCr of 0.86 mg/dL).   Medical History: Past Medical History:  Diagnosis Date  . Arthritis   . Asthma   . CHF (congestive heart failure) (HCC)   . COPD (chronic obstructive pulmonary disease) (HCC)   . Diabetes mellitus without complication (HCC)   . Hepatitis C   . Hypertension     Medications:  Scheduled:   Infusions:  . heparin 900 Units/hr (06/15/16 [id number])    Assessment: 60 yo male presented to ED with chest pain, SOB. Troponin elevated at 0.45. Pharmacy consulted to dose and manage a heparin drip for acute non-STEMI.    Goal of Therapy:  Heparin level 0.3-0.7 units/ml Monitor platelets by anticoagulation protocol: Yes   Plan:  Give 4000 units bolus x 1 Start heparin infusion at 900 units/hr Check anti-Xa level in 6 hours and daily while on heparin Continue to monitor H&H and platelets   HL ordered for 10/11 at 15:30.    Stormy CardKatsoudas,Cari Burgo K, RPh Clinical Pharmacist 06/15/2016,9:54 AM

## 2016-06-15 NOTE — Consult Note (Signed)
Reason for Consult: Unstable angina dyspnea heart failure Referring Physician: Dr. Bridgett Larsson hospitalist ,primary physician Dr. Laren Boom Kyle Rich is an 60 y.o. male.  HPI: Patient has a known history of multiple medical problems including congestive heart failure shortness of breast severe respiratory failure with COPD on home O2 for hypoxemia hypertension and diabetes started having severe chest discomfort this morning. Patient describes a midsternal without radiation tightness constant with worsening shortness of breath and wheezing. Patient denies previous cardiac history because the intensive for the pain he called rescue who picked him up treated him with aspirin inhalers oxygen and brought into the emergency room. Patient had a CT of his chest which didn't show pulmonary embolus. Initial troponin was 0.45 with intermittent chest pain. Patient states she's been under a lot of stress recently With mom with worsening symptoms and chest discomfort he came to emergency room for evaluation  Past Medical History:  Diagnosis Date  . Arthritis   . Asthma   . CHF (congestive heart failure) ([place])   . COPD (chronic obstructive pulmonary disease) ([place])   . Diabetes mellitus without complication ([place])   . Hepatitis C   . Hypertension     History reviewed. No pertinent surgical history.  Family History  Problem Relation Age of Onset  . Bladder Cancer Mother   . Lung cancer Father     Social History:  reports that he has been smoking Cigarettes.  He has a 20.00 pack-year smoking history. He has never used smokeless tobacco. He reports that he drinks about 0.6 oz of alcohol per week . He reports that he does not use drugs.  Allergies: No Known Allergies  Medications: I have reviewed the patient's current medications.  Results for orders placed or performed during the hospital encounter of 06/15/16 (from the past 48 hour(s))  Basic metabolic panel     Status: Abnormal   Collection Time:  06/15/16  6:05 AM  Result Value Ref Range   Sodium 138 135 - 145 mmol/L   Potassium 4.3 3.5 - 5.1 mmol/L    Comment: HEMOLYSIS AT THIS LEVEL MAY AFFECT RESULT   Chloride 104 101 - 111 mmol/L   CO2 26 22 - 32 mmol/L   Glucose, Bld 136 (H) 65 - 99 mg/dL   BUN 12 6 - 20 mg/dL   Creatinine, Ser 0.86 0.61 - 1.24 mg/dL   Calcium 8.5 (L) 8.9 - 10.3 mg/dL   GFR calc non Af Amer >60 >60 mL/min   GFR calc Af Amer >60 >60 mL/min    Comment: (NOTE) The eGFR has been calculated using the CKD EPI equation. This calculation has not been validated in all clinical situations. eGFR's persistently <60 mL/min signify possible Chronic Kidney Disease.    Anion gap 8 5 - 15  CBC     Status: Abnormal   Collection Time: 06/15/16  6:05 AM  Result Value Ref Range   WBC 13.5 (H) 3.8 - 10.6 K/uL   RBC 4.69 4.40 - 5.90 MIL/uL   Hemoglobin 14.2 13.0 - 18.0 g/dL   HCT 42.9 40.0 - 52.0 %   MCV 91.4 80.0 - 100.0 fL   MCH 30.3 26.0 - 34.0 pg   MCHC 33.2 32.0 - 36.0 g/dL   RDW 14.4 11.5 - 14.5 %   Platelets 279 150 - 440 K/uL    Comment: COUNT MAY BE INACCURATE DUE TO FIBRIN CLUMPS.  Troponin I     Status: Abnormal   Collection Time: 06/15/16  6:05 AM  Result Value Ref Range   Troponin I 0.45 (HH) <0.03 ng/mL    Comment: CRITICAL RESULT CALLED TO, READ BACK BY AND VERIFIED WITH KILEY WALKER@0652  ON 06/15/16 BY HKP   Fibrin derivatives D-Dimer ([facility] only)     Status: Abnormal   Collection Time: 06/15/16  6:05 AM  Result Value Ref Range   Fibrin derivatives D-dimer ([facility]) 1,022 (H) 0 - 499    Comment: <> Exclusion of Venous Thromboembolism (VTE) - OUTPATIENTS ONLY        (Emergency Department or [place])             0-499 ng/ml (FEU)  : With a low to intermediate pretest                                        probability for VTE this test result                                        excludes the diagnosis of VTE.           > 499 ng/ml (FEU)  : VTE not excluded.  Additional work up                                    for VTE is required.   <>  Testing on Inpatients and Evaluation of Disseminated Intravascular        Coagulation ([facility])             Reference Range:   0-499 ng/ml (FEU)   Blood gas, venous     Status: Abnormal   Collection Time: 06/15/16  6:20 AM  Result Value Ref Range   pH, Ven 7.37 7.250 - 7.430   pCO2, Ven 50 44.0 - 60.0 mmHg   pO2, Ven 68.0 (H) 32.0 - 45.0 mmHg   Bicarbonate 28.9 (H) 20.0 - 28.0 mmol/L   Acid-Base Excess 2.6 (H) 0.0 - 2.0 mmol/L   O2 Saturation 92.7 %   Patient temperature 37.0    Collection site RIGHT ANTECUBITAL    Sample type VENOUS   Protime-INR     Status: None   Collection Time: 06/15/16  9:20 AM  Result Value Ref Range   Prothrombin Time SPECIMEN CLOTTED 11.4 - 15.2 seconds    Comment: NOTIFIED ABIGAIL JACKSON @1012  06/15/2016 JLJ CORRECTED ON 10/11 AT 1015: PREVIOUSLY REPORTED AS 12.8    INR SPECIMEN CLOTTED     Comment: NOTIFIED ABIGAIL JACKSON @1012  06/15/2016 CORRECTED ON 10/11 AT 1015: PREVIOUSLY REPORTED AS 0.96   Glucose, capillary     Status: Abnormal   Collection Time: 06/15/16 10:12 AM  Result Value Ref Range   Glucose-Capillary 155 (H) 65 - 99 mg/dL  Troponin I     Status: Abnormal   Collection Time: 06/15/16 10:36 AM  Result Value Ref Range   Troponin I 0.40 (HH) <0.03 ng/mL    Comment: CRITICAL VALUE NOTED. VALUE IS CONSISTENT WITH PREVIOUSLY REPORTED/CALLED VALUE/HKP  Magnesium     Status: None   Collection Time: 06/15/16 10:36 AM  Result Value Ref Range   Magnesium 2.1 1.7 - 2.4 mg/dL  Glucose, capillary     Status: Abnormal   Collection Time: 06/15/16 12:02 PM  Result  Value Ref Range   Glucose-Capillary 175 (H) 65 - 99 mg/dL   Comment 1 Notify RN    Comment 2 Document in Chart   Glucose, capillary     Status: Abnormal   Collection Time: 06/15/16  4:13 PM  Result Value Ref Range   Glucose-Capillary 164 (H) 65 - 99 mg/dL   Comment 1 Notify RN    Comment 2 Document in Chart   Troponin I     Status: Abnormal    Collection Time: 06/15/16  4:30 PM  Result Value Ref Range   Troponin I 0.38 (HH) <0.03 ng/mL    Comment: CRITICAL VALUE NOTED. VALUE IS CONSISTENT WITH PREVIOUSLY REPORTED/CALLED VALUE.  TFK    Dg Chest 2 View  Result Date: 06/15/2016 CLINICAL DATA:  60 year old male with shortness of breath neck and chest pain. Initial encounter. EXAM: CHEST  2 VIEW COMPARISON:  Chest radiographs 12/10/2012. FINDINGS: Stable lung volumes. Stable mild cardiomegaly. Other mediastinal contours are within normal limits. Diffuse increased interstitial opacity in both lungs appears not significantly changed since 2014. No pneumothorax, pulmonary edema, pleural effusion or acute pulmonary opacity. No acute osseous abnormality identified. Negative visible bowel gas pattern. IMPRESSION: Chronic interstitial lung disease. No acute cardiopulmonary abnormality. Electronically Signed   By: Genevie Ann M.D.   On: 06/15/2016 07:19   Ct Angio Chest Pe W And/or Wo Contrast  Result Date: 06/15/2016 CLINICAL DATA:  60 year old male with a history of smoking, COPD, shortness of breath. EXAM: CT ANGIOGRAPHY CHEST WITH CONTRAST TECHNIQUE: Multidetector CT imaging of the chest was performed using the standard protocol during bolus administration of intravenous contrast. Multiplanar CT image reconstructions and MIPs were obtained to evaluate the vascular anatomy. CONTRAST:  75 cc Isovue 370 COMPARISON:  Chest CT 06/09/2015, 12/10/2012, 07/31/2012 FINDINGS: Cardiovascular: Heart: No pericardial fluid/thickening. Calcifications of the left main, left anterior descending, circumflex, right coronary artery. Calcifications of the aortic valve. Aorta: Unremarkable course caliber and contour of the thoracic aorta. Mild calcified and soft plaque. No aneurysm, dissection, periaortic fluid. Calcifications of the branch vessels. Common origin of the innominate artery an the left common carotid artery. Pulmonary arterial tree: No central, lobar, segmental  filling defects. Beyond this study is limited by the timing of the contrast bolus and respiratory motion. Mediastinum/Nodes: Mediastinal lymph nodes are present, though not enlarged by CT size criteria. Central airways are clear. Unremarkable appearance of the thoracic esophagus. Unremarkable appearance of the thoracic inlet including the thyroid. Lungs/Pleura: Respiratory motion somewhat limits evaluation. Centrilobular and paraseptal emphysema. Airway thickening. No confluent airspace disease. No pneumothorax or pleural effusion. No debris within the airway is. Mild tree-in-bud opacity at the lung bases, with superimposed atelectasis. 5 mm nodule at the posterior right lower lobe, appears unchanged from prior though difficult to assess given respiratory motion and the associated atelectasis. Upper Abdomen: Unremarkable appearance of the upper abdomen. Musculoskeletal: No displaced fracture. Multilevel degenerative changes of the thoracic spine. Posterior disc bulge at T11-T12 Review of the MIP images confirms the above findings. IMPRESSION: Study is negative for pulmonary emboli. No evidence of acute aortic syndrome. Centrilobular and paraseptal emphysema. There is superimposed airway thickening and mild pattern of tree in bud opacity at the lung bases, suggesting bronchitis, which could be chronic or acute on chronic. Aortic atherosclerosis. Associated left main and 3 vessel coronary artery disease. Unchanged appearance of right lower lobe nodule measuring 5 mm, similar size dating to 2013. Signed, Dulcy Fanny. Earleen Newport, DO [facility] Electronically Signed  By: Corrie Mckusick D.O.   On: 06/15/2016 08:02    Review of Systems  Constitutional: Positive for malaise/fatigue and weight loss.  HENT: Positive for congestion.   Eyes: Negative.   Respiratory: Positive for cough, sputum production, shortness of breath and wheezing.   Cardiovascular: Positive for  chest pain, palpitations, orthopnea, claudication and PND.  Gastrointestinal: Positive for heartburn.  Genitourinary: Negative.   Musculoskeletal: Positive for joint pain and myalgias.  Skin: Negative.   Neurological: Positive for dizziness and weakness.  Endo/Heme/Allergies: Negative.   Psychiatric/Behavioral: Negative.    Blood pressure (!) 140/99, pulse 84, temperature 98 F (36.7 C), temperature source Oral, resp. rate 18, height 5' 4"  (1.626 m), weight 89.8 kg (198 lb), SpO2 98 %. Physical Exam  Nursing note and vitals reviewed. Constitutional: He is oriented to person, place, and time. He appears well-developed and well-nourished.  HENT:  Head: Normocephalic and atraumatic.  Eyes: Conjunctivae and EOM are normal. Pupils are equal, round, and reactive to light.  Neck: Normal range of motion. Neck supple.  Cardiovascular: Normal rate and regular rhythm.  Exam reveals gallop.   Murmur heard. Respiratory: He has decreased breath sounds. He has wheezes. He has rhonchi.  GI: Soft. Bowel sounds are normal.  Musculoskeletal: Normal range of motion.  Bilateral reduced pulses lower extremities and femoral area  Neurological: He is alert and oriented to person, place, and time. He has normal reflexes.  Skin: Skin is warm and dry.  Psychiatric: He has a normal mood and affect.    Assessment/Plan: Unstable angina Shortness of breath Congestive heart failure systolic dysfunction COPD Chronic history failure Hypoxemia on home O2 Hypertension Diabetes type 2 uncomplicated Borderline troponins Smoker Peripheral vascular disease status post intervention Hepatitis C . Plan Agree with present therapy for anticoagulation Admission for unstable angina symptoms Follow-up EKGs and troponins Continue supplemental oxygen Agree with inhalers for chronic COPD Consider echocardiogram for further assessment of heart failure Blood pressure control Continue diabetes management Recommend  echocardiogram for evaluation of shortness of breath or congestive heart failure Consider invasive strategy with cardiac catheter because of persistent accelerating unstable angina  Damier Disano D. 06/15/2016, 6:51 PM

## 2016-06-15 NOTE — ED Notes (Signed)
Respiratory called for vbg pickup. VBG labeled and in ice at beside.

## 2016-06-15 NOTE — Clinical Social Work Note (Addendum)
MSW received consult for medication assistance, case manager can assist with this please consult case management.  MSW notified case manager, MSW to sign off please reconsult if social work needs arise.  Kyle KnackEric R. Athena Rich, MSW [phone]  Mon-Fri 8a-4:30p 06/15/2016 11:13 AM

## 2016-06-15 NOTE — ED Notes (Signed)
Patient transported to X-ray 

## 2016-06-15 NOTE — Discharge Summary (Signed)
[facility] [place] [facility]   PATIENT NAME: Kyle Rich    MR#:  [id number]  DATE OF BIRTH:  [birth year]  DATE OF ADMISSION:  06/15/2016   ADMITTING PHYSICIAN: Shaune Pollack, MD  DATE OF DISCHARGE: 06/15/2016  7:35 PM  PRIMARY CARE PHYSICIAN: Rayetta Humphrey, MD   ADMISSION DIAGNOSIS:  Shortness of breath [R06.02] NSTEMI (non-ST elevated myocardial infarction) (HCC) [I21.4] DISCHARGE DIAGNOSIS:  Active Problems:   NSTEMI (non-ST elevated myocardial infarction) (HCC) COPD exacerbation. SECONDARY DIAGNOSIS:   Past Medical History:  Diagnosis Date  . Arthritis   . Asthma   . CHF (congestive heart failure) (HCC)   . COPD (chronic obstructive pulmonary disease) (HCC)   . Diabetes mellitus without complication (HCC)   . Hepatitis C   . Hypertension    HOSPITAL COURSE:  Acute non-STEMI, multi-vessel CAD. The patient was treated with aspirin 1 dose by EMS, started heparin drip and Lipitor, nitroglycerin when necessary. Dr. Juliann Pares did cardiac catheter today, showing  Multivessel coronary disease including left main and proximal LAD with moderate to severely depressed left ventricular function EF about 35%. The patient was transferred to [facility] for possible coronary bypass versus complex intervention  COPD exacerbation. Started Xopenex every 6 hours and IV Solu-Medrol, continue Advair and Spiriva.  Chronic respiratory failure with home oxygen 2 L. Continue home oxygen and nebulizer treatment.  Leukocytosis. Possible due to reaction.  Hypertension. Continue lisinopril and started Lopressor. Diabetes. Started sliding scale.  Tobacco abuse. Smoking cessation was counseled for 4-5 minutes, given nicotine patch.  DISCHARGE CONDITIONS:  Guarded, transferred to [facility] today. CONSULTS OBTAINED:  Treatment Team:  Alwyn Pea, MD DRUG ALLERGIES:  No Known Allergies DISCHARGE MEDICATIONS:     Medication List    TAKE these  medications   aspirin 325 MG EC tablet Take 1 tablet (325 mg total) by mouth daily. Start taking on:  06/16/2016   atorvastatin 40 MG tablet Commonly known as:  LIPITOR Take 1 tablet (40 mg total) by mouth daily at 6 PM. Start taking on:  06/16/2016   hydrochlorothiazide 25 MG tablet Commonly known as:  HYDRODIURIL Take 12.5 mg by mouth daily.   lisinopril 10 MG tablet Commonly known as:  PRINIVIL,ZESTRIL Take 10 mg by mouth daily.   metFORMIN 500 MG tablet Commonly known as:  GLUCOPHAGE Take by mouth 2 (two) times daily with a meal.   metoprolol tartrate 25 MG tablet Commonly known as:  LOPRESSOR Take 1 tablet (25 mg total) by mouth 2 (two) times daily.   potassium chloride SA 20 MEQ tablet Commonly known as:  K-DUR,KLOR-CON Take 20 mEq by mouth daily.   PROAIR HFA 108 (90 Base) MCG/ACT inhaler Generic drug:  albuterol Inhale 2 puffs into the lungs every 6 (six) hours as needed for wheezing.   albuterol (2.5 MG/3ML) 0.083% nebulizer solution Commonly known as:  PROVENTIL Inhale 3 mLs into the lungs every 4 (four) hours as needed for wheezing.        DISCHARGE INSTRUCTIONS:   DIET:  npo DISCHARGE CONDITION:  Guarded. ACTIVITY:  Bed rest. OXYGEN:  Home Oxygen:  2 L [place]. DISCHARGE LOCATION:  [facility].  If you experience worsening of your admission symptoms, develop shortness of breath, life threatening emergency, suicidal or homicidal thoughts you must seek medical attention immediately by calling 911 or calling your MD immediately  if symptoms less severe.  You Must read complete instructions/literature along with all the possible adverse reactions/side effects for all  the Medicines you take and that have been prescribed to you. Take any new Medicines after you have completely understood and accpet all the possible adverse reactions/side effects.   Please note  You were cared for by a hospitalist during your hospital stay. If you have any questions  about your discharge medications or the care you received while you were in the hospital after you are discharged, you can call the unit and asked to speak with the hospitalist on call if the hospitalist that took care of you is not available. Once you are discharged, your primary care physician will handle any further medical issues. Please note that NO REFILLS for any discharge medications will be authorized once you are discharged, as it is imperative that you return to your primary care physician (or establish a relationship with a primary care physician if you do not have one) for your aftercare needs so that they can reassess your need for medications and monitor your lab values.    On the day of Discharge:  VITAL SIGNS:  Blood pressure (!) 140/99, pulse 84, temperature 98 F (36.7 C), temperature source Oral, resp. rate 18, height 5\' 4"  (1.626 m), weight 198 lb (89.8 kg), SpO2 98 %. PHYSICAL EXAMINATION:  GENERAL:  60 y.o.-year-old patient lying in the bed with no acute distress.  EYES: Pupils equal, round, reactive to light and accommodation. No scleral icterus. Extraocular muscles intact.  HEENT: Head atraumatic, normocephalic. Oropharynx and nasopharynx clear.  NECK:  Supple, no jugular venous distention. No thyroid enlargement, no tenderness.  LUNGS: Normal breath sounds bilaterally, no wheezing, rales,rhonchi or crepitation. No use of accessory muscles of respiration.  CARDIOVASCULAR: S1, S2 normal. No murmurs, rubs, or gallops.  ABDOMEN: Soft, non-tender, non-distended. Bowel sounds present. No organomegaly or mass.  EXTREMITIES: No pedal edema, cyanosis, or clubbing.  NEUROLOGIC: Cranial nerves II through XII are intact. Muscle strength 5/5 in all extremities. Sensation intact. Gait not checked.  PSYCHIATRIC: The patient is alert and oriented x 3.  SKIN: No obvious rash, lesion, or ulcer.  DATA REVIEW:   CBC  Recent Labs Lab 06/15/16 0605  WBC 13.5*  HGB 14.2  HCT 42.9    PLT 279    Chemistries   Recent Labs Lab 06/15/16 0605 06/15/16 1036  NA 138  --   K 4.3  --   CL 104  --   CO2 26  --   GLUCOSE 136*  --   BUN 12  --   CREATININE 0.86  --   CALCIUM 8.5*  --   MG  --  2.1     Microbiology Results  Results for orders placed or performed in visit on 05/05/13  Wound culture     Status: None   Collection Time: 05/05/13 12:31 PM  Result Value Ref Range Status   Micro Text Report   Final       SOURCE: NOSE    ORGANISM 1                LIGHT GROWTH METHICILLIN RESISTANT STAPH.AUREUS   ORGANISM 2                LIGHT GROWTH STREPTOCOCCUS AGALACTIAE (GROUP B)   ORGANISM 3                LIGHT GROWTH STREPTOCOCCUS PNEUMONIAE   COMMENT                   -   GRAM STAIN  FEW WHITE BLOOD CELLS   GRAM STAIN                FEW GRAM POSITIVE COCCI IN PAIRS   GRAM STAIN                FEW GRAM VARIABLE RODS   ANTIBIOTIC                    ORG#1    ORG#2    ORG#3     CIPROFLOXACIN                 R                           CLINDAMYCIN                   S                           ERYTHROMYCIN                  R                 R/8.0     GENTAMICIN                    S                           LEVOFLOXACIN                  I                 S/1.0     LINEZOLID                     S                           OXACILLIN                     R                 R         TIGECYCLINE                   S                           VANCOMYCIN                     S                 S/0.75    CEFOXITIN SCREEN              POSITIVE                    INDUCIBLE CLINDAMYCIN RESISTANNEGATIVE                    TRIMETHOPRIM/SULFAMETHOXAZOLE S                           CEFTRIAXONE  S/0.125   PENICILLIN MIC                                  I/0.50        RADIOLOGY:  Dg Chest 2 View  Result Date: 06/15/2016 CLINICAL DATA:  60 year old male with shortness of breath neck and chest pain. Initial encounter. EXAM:  CHEST  2 VIEW COMPARISON:  Chest radiographs 12/10/2012. FINDINGS: Stable lung volumes. Stable mild cardiomegaly. Other mediastinal contours are within normal limits. Diffuse increased interstitial opacity in both lungs appears not significantly changed since 2014. No pneumothorax, pulmonary edema, pleural effusion or acute pulmonary opacity. No acute osseous abnormality identified. Negative visible bowel gas pattern. IMPRESSION: Chronic interstitial lung disease. No acute cardiopulmonary abnormality. Electronically Signed   By: Odessa FlemingH  Hall M.D.   On: 06/15/2016 07:19   Ct Angio Chest Pe W And/or Wo Contrast  Result Date: 06/15/2016 CLINICAL DATA:  60 year old male with a history of smoking, COPD, shortness of breath. EXAM: CT ANGIOGRAPHY CHEST WITH CONTRAST TECHNIQUE: Multidetector CT imaging of the chest was performed using the standard protocol during bolus administration of intravenous contrast. Multiplanar CT image reconstructions and MIPs were obtained to evaluate the vascular anatomy. CONTRAST:  75 cc Isovue 370 COMPARISON:  Chest CT 06/09/2015, 12/10/2012, 07/31/2012 FINDINGS: Cardiovascular: Heart: No pericardial fluid/thickening. Calcifications of the left main, left anterior descending, circumflex, right coronary artery. Calcifications of the aortic valve. Aorta: Unremarkable course caliber and contour of the thoracic aorta. Mild calcified and soft plaque. No aneurysm, dissection, periaortic fluid. Calcifications of the branch vessels. Common origin of the innominate artery an the left common carotid artery. Pulmonary arterial tree: No central, lobar, segmental filling defects. Beyond this study is limited by the timing of the contrast bolus and respiratory motion. Mediastinum/Nodes: Mediastinal lymph nodes are present, though not enlarged by CT size criteria. Central airways are clear. Unremarkable appearance of the thoracic esophagus. Unremarkable appearance of the thoracic inlet including the thyroid.  Lungs/Pleura: Respiratory motion somewhat limits evaluation. Centrilobular and paraseptal emphysema. Airway thickening. No confluent airspace disease. No pneumothorax or pleural effusion. No debris within the airway is. Mild tree-in-bud opacity at the lung bases, with superimposed atelectasis. 5 mm nodule at the posterior right lower lobe, appears unchanged from prior though difficult to assess given respiratory motion and the associated atelectasis. Upper Abdomen: Unremarkable appearance of the upper abdomen. Musculoskeletal: No displaced fracture. Multilevel degenerative changes of the thoracic spine. Posterior disc bulge at T11-T12 Review of the MIP images confirms the above findings. IMPRESSION: Study is negative for pulmonary emboli. No evidence of acute aortic syndrome. Centrilobular and paraseptal emphysema. There is superimposed airway thickening and mild pattern of tree in bud opacity at the lung bases, suggesting bronchitis, which could be chronic or acute on chronic. Aortic atherosclerosis. Associated left main and 3 vessel coronary artery disease. Unchanged appearance of right lower lobe nodule measuring 5 mm, similar size dating to 2013. Signed, Yvone NeuJaime S. Loreta AveWagner, DO [facility] Electronically Signed   By: Gilmer MorJaime  Wagner D.O.   On: 06/15/2016 08:02     Management plans discussed with the patient, family and they are in agreement.  CODE STATUS:     Code Status Orders        Start     Ordered   06/15/16 1008  Full code  Continuous     06/15/16 1007    Code Status History  Date Active Date Inactive Code Status Order ID Comments User Context   06/09/2015 11:59 AM 06/10/2015  3:27 PM Full Code [id number]  Houston Siren, MD Inpatient      TOTAL TIME TAKING CARE OF THIS PATIENT: 32 minutes.    Shaune Pollack M.D on 06/15/2016 at 8:46 PM  Between 7am to 6pm - Pager - [phone]  After 6pm go to [email or web address] - Air traffic controller  [facility]  Office  [phone]  CC: Primary care physician; Rayetta Humphrey, MD   Note: This dictation was prepared with Dragon dictation along with smaller phrase technology. Any transcriptional errors that result from this process are unintentional.

## 2016-06-15 NOTE — ED Triage Notes (Addendum)
Pt. To ED from home via [facility] due to chest pain and neck pain. Hx arthritis in both. SOB pt on 2L O2 via nasal canula (at home regimen). Pt given 324 asa en route.

## 2016-06-15 NOTE — ED Provider Notes (Signed)
[facility] Emergency Department Provider Note   ____________________________________________   First MD Initiated Contact with Patient 06/15/16 [phone]     (approximate)  I have reviewed the triage vital signs and the nursing notes.   HISTORY  Chief Complaint Chest Pain    HPI Kyle Rich is a 60 y.o. male who comes into the hospital today with chest pain, neck pain and pain in his lungs. He reports that it hurts to take a breath. The patient's symptoms started at 2 AM. He reports that every time he moves it hurts. He's been trying to sleep all night but he has been unable to do so. He reports that his mom had a stroke and he's been trying to take care of her. He thinks he may have overdone it. The patient rates his pain at 20 out of 10 in intensity. He couldn't take it any longer so he called EMS to take him here. The patient took some BC powder for [place] chest pain. The patient denies any nausea or vomiting, wet or dizziness. The patient wears O2 regularly but reports is not helping with his breathing. The patient reports he has hurt his neck in the past but this does not seem to be the same.   Past Medical History:  Diagnosis Date  . Arthritis   . Asthma   . CHF (congestive heart failure) (HCC)   . COPD (chronic obstructive pulmonary disease) (HCC)   . Diabetes mellitus without complication (HCC)   . Hepatitis C   . Hypertension     Patient Active Problem List   Diagnosis Date Noted  . COPD exacerbation (HCC) 06/09/2015    History reviewed. No pertinent surgical history.  Prior to Admission medications   Medication Sig Start Date End Date Taking? Authorizing Provider  Fluticasone-Salmeterol (ADVAIR DISKUS) 500-50 MCG/DOSE AEPB Inhale 1 puff into the lungs every 12 (twelve) hours.   Yes Historical Provider, MD  hydrochlorothiazide (HYDRODIURIL) 25 MG tablet Take 12.5 mg by mouth daily.   Yes Historical Provider, MD  lisinopril (PRINIVIL,ZESTRIL) 10  MG tablet Take 10 mg by mouth daily.   Yes Historical Provider, MD  potassium chloride SA (K-DUR,KLOR-CON) 20 MEQ tablet Take 20 mEq by mouth daily.   Yes Historical Provider, MD  albuterol (PROAIR HFA) 108 (90 BASE) MCG/ACT inhaler Inhale 2 puffs into the lungs every 6 (six) hours as needed for wheezing.     Historical Provider, MD  albuterol (PROVENTIL) (2.5 MG/3ML) 0.083% nebulizer solution Inhale 3 mLs into the lungs every 4 (four) hours as needed for wheezing.     Historical Provider, MD  LORazepam (ATIVAN) 0.5 MG tablet Take 0.25 mg by mouth at bedtime as needed for anxiety (for up to 10 days at at time).     Historical Provider, MD  tiotropium (SPIRIVA) 18 MCG inhalation capsule Place 18 mcg into inhaler and inhale daily.    Historical Provider, MD    Allergies Review of patient's allergies indicates no known allergies.  Family History  Problem Relation Age of Onset  . Bladder Cancer Mother   . Lung cancer Father     Social History Social History  Substance Use Topics  . Smoking status: Current Every Day Smoker    Packs/day: 0.50    Years: 40.00    Types: Cigarettes  . Smokeless tobacco: Never Used  . Alcohol use 0.6 oz/week    1 Cans of beer per week    Review of Systems Constitutional: No fever/chills  Eyes: No visual changes. ENT: No sore throat. Cardiovascular:  chest pain. Respiratory: shortness of breath. Gastrointestinal: No abdominal pain.  No nausea, no vomiting.  No diarrhea.  No constipation. Genitourinary: Negative for dysuria. Musculoskeletal: Negative for back pain. Skin: Negative for rash. Neurological: Negative for headaches, focal weakness or numbness.  10-point ROS otherwise negative.  ____________________________________________   PHYSICAL EXAM:  VITAL SIGNS: ED Triage Vitals  Enc Vitals Group     BP 06/15/16 0608 (!) 176/100     Pulse Rate 06/15/16 0608 96     Resp 06/15/16 0608 (!) 29     Temp 06/15/16 0608 98.4 F (36.9 C)     Temp  Source 06/15/16 0608 Oral     SpO2 06/15/16 0608 97 %     Weight 06/15/16 0608 198 lb (89.8 kg)     Height 06/15/16 0608 5\' 4"  (1.626 m)     Head Circumference --      Peak Flow --      Pain Score 06/15/16 0609 10     Pain Loc --      Pain Edu? --      Excl. in GC? --     Constitutional: Alert and oriented. Well appearing and in moderate distress. Eyes: Conjunctivae are normal. PERRL. EOMI. Head: Atraumatic. Nose: No congestion/rhinnorhea. Mouth/Throat: Mucous membranes are moist.  Oropharynx non-erythematous. Cardiovascular: Normal rate, regular rhythm. Grossly normal heart sounds.  Good peripheral circulation. Respiratory: Increased respiratory effort.  No retractions. Expiratory Wheezes Gastrointestinal: Soft and nontender. No distention. Positive bowel sounds Musculoskeletal: No lower extremity tenderness nor edema.  Neurologic:  Normal speech and language.  Skin:  Skin is warm, dry and intact. No rash noted. Psychiatric: Mood and affect are normal. Speech and behavior are normal.  ____________________________________________   LABS (all labs ordered are listed, but only abnormal results are displayed)  Labs Reviewed  BASIC METABOLIC PANEL - Abnormal; Notable for the following:       Result Value   Glucose, Bld 136 (*)    Calcium 8.5 (*)    All other components within normal limits  CBC - Abnormal; Notable for the following:    WBC 13.5 (*)    All other components within normal limits  TROPONIN I - Abnormal; Notable for the following:    Troponin I 0.45 (*)    All other components within normal limits  BLOOD GAS, VENOUS - Abnormal; Notable for the following:    pO2, Ven 68.0 (*)    Bicarbonate 28.9 (*)    Acid-Base Excess 2.6 (*)    All other components within normal limits  FIBRIN DERIVATIVES D-DIMER ([facility] ONLY) - Abnormal; Notable for the following:    Fibrin derivatives D-dimer ([facility]) 1,022 (*)    All other components within normal limits    ____________________________________________  EKG  ED ECG REPORT I, Rebecka ApleyWebster,  Allison P, the attending physician, personally viewed and interpreted this ECG.   Date: 06/15/2016  EKG Time: 605  Rate: 92  Rhythm: normal EKG, normal sinus rhythm, unchanged from previous tracings, normal sinus rhythm  Axis: normal  Intervals:none  ST&T Change: Flipped T waves and ST depressions in lead V5 and V6.  ____________________________________________  RADIOLOGY  CXR CT angio chest ____________________________________________   PROCEDURES  Procedure(s) performed: None  Procedures  Critical Care performed: No  ____________________________________________   INITIAL IMPRESSION / ASSESSMENT AND PLAN / ED COURSE  Pertinent labs & imaging results that were available during my care of the patient were reviewed by  me and considered in my medical decision making (see chart for details).  This is a 60 year old male who comes into the hospital today with some chest pain and shortness of breath. The patient reports that the pain is worse when he takes a deep breath. He does have some wheezes so we did give him a DuoNeb treatment. He did receive aspirin by EMS. I will await the results of the blood work at imaging studies prior to determining the patient's disposition.  It was found that the patient has an elevated troponin of 0.45. He did receive more nitroglycerin as well as a dose of morphine. His d-dimer was also elevated so we will await the results of the CT of his chest prior to dispositioning the patient. He will be admitted to the hospital.  Clinical Course  Value Comment By Time  DG Chest 2 View Chronic interstitial lung disease. No acute cardiopulmonary abnormality.   Rebecka Apley, MD 10/11 [phone]  CT Angio Chest PE W and/or Wo Contrast Study is negative for pulmonary emboli.  No evidence of acute aortic syndrome.  Centrilobular and paraseptal emphysema. There is  superimposed airway thickening and mild pattern of tree in bud opacity at the lung bases, suggesting bronchitis, which could be chronic or acute on chronic.  Aortic atherosclerosis. Associated left main and 3 vessel coronary artery disease.  Unchanged appearance of right lower lobe nodule measuring 5 mm, similar size dating to 2013.   Rebecka Apley, MD 10/11 [phone]    The patient's imaging does not show any pneumonia nor does it show any PE or aortic pathology. The patient appears to be having an and STEMI. I will give the patient a Nitropaste to his chest as well as some morphine for his pain. The patient will be started on a heparin drip and he will be admitted to the hospitalist service.   ____________________________________________   FINAL CLINICAL IMPRESSION(S) / ED DIAGNOSES  Final diagnoses:  NSTEMI (non-ST elevated myocardial infarction) (HCC)  Shortness of breath      NEW MEDICATIONS STARTED DURING THIS VISIT:  New Prescriptions   No medications on file     Note:  This document was prepared using Dragon voice recognition software and may include unintentional dictation errors.    Rebecka Apley, MD 06/15/16 [phone]

## 2016-06-15 NOTE — H&P (Signed)
[facility] [place] [facility] [place]   PATIENT NAME: Kyle Rich    MR#:  [id number]  DATE OF BIRTH:  05-28-1956  DATE OF ADMISSION:  06/15/2016  PRIMARY CARE PHYSICIAN: Rayetta Humphrey, MD   REQUESTING/REFERRING PHYSICIAN: Rebecka Apley, MD  CHIEF COMPLAINT:   Chief Complaint  Patient presents with  . Chest Pain   Chest pain today HISTORY OF PRESENT ILLNESS:  Kyle Rich  is a 60 y.o. male with a known history of Hypertension, diabetes, CHF, chronic respiratory failure and COPD. The patient is started to have chest pain at 2 AM today, which is in substernal area, tight, constant without radiation. He also complains of shortness breasts and wheezing. He denies any fever or chills, no nausea or diaphoresis. He was treated with aspirin by EMS and sent to ED. His troponin level is elevated at 0.45. CT angiogram didn't show any PE. He said he has a lot of stress recently since his mother had stroke recently. He has chronic respiratory failure on home oxygen 2 L.  PAST MEDICAL HISTORY:   Past Medical History:  Diagnosis Date  . Arthritis   . Asthma   . CHF (congestive heart failure) (HCC)   . COPD (chronic obstructive pulmonary disease) (HCC)   . Diabetes mellitus without complication (HCC)   . Hepatitis C   . Hypertension     PAST SURGICAL HISTORY:  History reviewed. No pertinent surgical history.  SOCIAL HISTORY:   Social History  Substance Use Topics  . Smoking status: Current Every Day Smoker    Packs/day: 0.50    Years: 40.00    Types: Cigarettes  . Smokeless tobacco: Never Used  . Alcohol use 0.6 oz/week    1 Cans of beer per week    FAMILY HISTORY:   Family History  Problem Relation Age of Onset  . Bladder Cancer Mother   . Lung cancer Father     DRUG ALLERGIES:  No Known Allergies  REVIEW OF SYSTEMS:   Review of Systems  Constitutional: Negative for chills, fever and malaise/fatigue.  HENT: Negative for sore throat.   Eyes:  Negative for blurred vision and double vision.  Respiratory: Positive for shortness of breath and wheezing. Negative for cough, hemoptysis and stridor.   Cardiovascular: Positive for chest pain. Negative for palpitations and leg swelling.  Gastrointestinal: Negative for abdominal pain, blood in stool, diarrhea, melena, nausea and vomiting.  Genitourinary: Negative for dysuria, frequency and urgency.  Musculoskeletal: Negative for joint pain.  Skin: Negative for itching and rash.  Neurological: Negative for dizziness, focal weakness, seizures, loss of consciousness and headaches.  Psychiatric/Behavioral: Negative for depression. The patient is nervous/anxious.     MEDICATIONS AT HOME:   Prior to Admission medications   Medication Sig Start Date End Date Taking? Authorizing Provider  Fluticasone-Salmeterol (ADVAIR DISKUS) 500-50 MCG/DOSE AEPB Inhale 1 puff into the lungs every 12 (twelve) hours.   Yes Historical Provider, MD  hydrochlorothiazide (HYDRODIURIL) 25 MG tablet Take 12.5 mg by mouth daily.   Yes Historical Provider, MD  lisinopril (PRINIVIL,ZESTRIL) 10 MG tablet Take 10 mg by mouth daily.   Yes Historical Provider, MD  potassium chloride SA (K-DUR,KLOR-CON) 20 MEQ tablet Take 20 mEq by mouth daily.   Yes Historical Provider, MD  albuterol (PROAIR HFA) 108 (90 BASE) MCG/ACT inhaler Inhale 2 puffs into the lungs every 6 (six) hours as needed for wheezing.     Historical Provider, MD  albuterol (PROVENTIL) (2.5 MG/3ML) 0.083% nebulizer  solution Inhale 3 mLs into the lungs every 4 (four) hours as needed for wheezing.     Historical Provider, MD  LORazepam (ATIVAN) 0.5 MG tablet Take 0.25 mg by mouth at bedtime as needed for anxiety (for up to 10 days at at time).     Historical Provider, MD  tiotropium (SPIRIVA) 18 MCG inhalation capsule Place 18 mcg into inhaler and inhale daily.    Historical Provider, MD      VITAL SIGNS:  Blood pressure (!) 178/76, pulse (!) 109, temperature 98.4  F (36.9 C), temperature source Oral, resp. rate (!) 28, height 5\' 4"  (1.626 m), weight 198 lb (89.8 kg), SpO2 97 %.  PHYSICAL EXAMINATION:  Physical Exam  GENERAL:  60 y.o.-year-old patient lying in the bed with Mild distress.  EYES: Pupils equal, round, reactive to light and accommodation. No scleral icterus. Extraocular muscles intact.  HEENT: Head atraumatic, normocephalic. Oropharynx and nasopharynx clear.  NECK:  Supple, no jugular venous distention. No thyroid enlargement, no tenderness.  LUNGS: Diminished breath sounds bilaterally, expiratory wheezing, no rales,rhonchi or crepitation. No use of accessory muscles of respiration.  CARDIOVASCULAR: S1, S2 normal. No murmurs, rubs, or gallops.  ABDOMEN: Soft, nontender, nondistended. Bowel sounds present. No organomegaly or mass.  EXTREMITIES: No pedal edema, cyanosis, or clubbing.  NEUROLOGIC: Cranial nerves II through XII are intact. Muscle strength 5/5 in all extremities. Sensation intact. Gait not checked.  PSYCHIATRIC: The patient is alert and oriented x 3. He feels nervous . SKIN: No obvious rash, lesion, or ulcer.   LABORATORY PANEL:   CBC  Recent Labs Lab 06/15/16 0605  WBC 13.5*  HGB 14.2  HCT 42.9  PLT 279   ------------------------------------------------------------------------------------------------------------------  Chemistries   Recent Labs Lab 06/15/16 0605  NA 138  K 4.3  CL 104  CO2 26  GLUCOSE 136*  BUN 12  CREATININE 0.86  CALCIUM 8.5*   ------------------------------------------------------------------------------------------------------------------  Cardiac Enzymes  Recent Labs Lab 06/15/16 0605  TROPONINI 0.45*   ------------------------------------------------------------------------------------------------------------------  RADIOLOGY:  Dg Chest 2 View  Result Date: 06/15/2016 CLINICAL DATA:  60 year old male with shortness of breath neck and chest pain. Initial encounter.  EXAM: CHEST  2 VIEW COMPARISON:  Chest radiographs 12/10/2012. FINDINGS: Stable lung volumes. Stable mild cardiomegaly. Other mediastinal contours are within normal limits. Diffuse increased interstitial opacity in both lungs appears not significantly changed since 2014. No pneumothorax, pulmonary edema, pleural effusion or acute pulmonary opacity. No acute osseous abnormality identified. Negative visible bowel gas pattern. IMPRESSION: Chronic interstitial lung disease. No acute cardiopulmonary abnormality. Electronically Signed   By: Odessa FlemingH  Hall M.D.   On: 06/15/2016 07:19   Ct Angio Chest Pe W And/or Wo Contrast  Result Date: 06/15/2016 CLINICAL DATA:  [age] year old male with a history of smoking, COPD, shortness of breath. EXAM: CT ANGIOGRAPHY CHEST WITH CONTRAST TECHNIQUE: Multidetector CT imaging of the chest was performed using the standard protocol during bolus administration of intravenous contrast. Multiplanar CT image reconstructions and MIPs were obtained to evaluate the vascular anatomy. CONTRAST:  75 cc Isovue 370 COMPARISON:  Chest CT 06/09/2015, 12/10/2012, 07/31/2012 FINDINGS: Cardiovascular: Heart: No pericardial fluid/thickening. Calcifications of the left main, left anterior descending, circumflex, right coronary artery. Calcifications of the aortic valve. Aorta: Unremarkable course caliber and contour of the thoracic aorta. Mild calcified and soft plaque. No aneurysm, dissection, periaortic fluid. Calcifications of the branch vessels. Common origin of the innominate artery an the left common carotid artery. Pulmonary arterial tree: No central, lobar, segmental filling defects. Beyond this study  is limited by the timing of the contrast bolus and respiratory motion. Mediastinum/Nodes: Mediastinal lymph nodes are present, though not enlarged by CT size criteria. Central airways are clear. Unremarkable appearance of the thoracic esophagus. Unremarkable appearance of the thoracic inlet including the  thyroid. Lungs/Pleura: Respiratory motion somewhat limits evaluation. Centrilobular and paraseptal emphysema. Airway thickening. No confluent airspace disease. No pneumothorax or pleural effusion. No debris within the airway is. Mild tree-in-bud opacity at the lung bases, with superimposed atelectasis. 5 mm nodule at the posterior right lower lobe, appears unchanged from prior though difficult to assess given respiratory motion and the associated atelectasis. Upper Abdomen: Unremarkable appearance of the upper abdomen. Musculoskeletal: No displaced fracture. Multilevel degenerative changes of the thoracic spine. Posterior disc bulge at T11-T12 Review of the MIP images confirms the above findings. IMPRESSION: Study is negative for pulmonary emboli. No evidence of acute aortic syndrome. Centrilobular and paraseptal emphysema. There is superimposed airway thickening and mild pattern of tree in bud opacity at the lung bases, suggesting bronchitis, which could be chronic or acute on chronic. Aortic atherosclerosis. Associated left main and 3 vessel coronary artery disease. Unchanged appearance of right lower lobe nodule measuring 5 mm, similar size dating to 2013. Signed, Yvone Neu. Loreta Ave, DO [facility] Electronically Signed   By: Gilmer Mor D.O.   On: 06/15/2016 08:02      IMPRESSION AND PLAN:   Acute non-STEMI The patient will be admitted to telemetry floor. He was treated with aspirin 1 dose by EMS, continue aspirin, start heparin drip and Lipitor, nitroglycerin when necessary. Follow-up troponin and cardiology consult. I discussed with Dr. Juliann Pares, who agreed to current treatment plan and suggested cardiac catheter today. Keep nothing by mouth.  COPD exacerbation. Start Xopenex every 6 hours and IV Solu-Medrol, continue Advair and Spiriva.  Chronic respiratory failure with home oxygen 2 L. Continue home oxygen and nebulizer  treatment.  Leukocytosis. Possible due to reaction.  Hypertension. Continue lisinopril and start Lopressor. Diabetes. Start sliding scale.  Tobacco abuse. Smoking cessation was counseled for 4-5 minutes, given nicotine patch.  All the records are reviewed and case discussed with ED provider. Management plans discussed with the patient, family and they are in agreement.  CODE STATUS: full code.  TOTAL TIME TAKING CARE OF THIS PATIENT: 58 minutes.    Shaune Pollack M.D on 06/15/2016 at 8:37 AM  Between 7am to 6pm - Pager - [phone]  After 6pm go to [email or web address] - Social research officer, government  [facility]  Office  [phone]  CC: Primary care physician; Rayetta Humphrey, MD   Note: This dictation was prepared with Dragon dictation along with smaller phrase technology. Any transcriptional errors that result from this process are unintentional.

## 2016-06-15 NOTE — Progress Notes (Signed)
[facility]  For continue of care by care link , report given to icu nurse , groin site level 1 with slight bruise and soft hematoma noted , pedal pulse palpable , foot warm to touch .

## 2016-06-16 ENCOUNTER — Encounter: Payer: Self-pay | Admitting: Internal Medicine

## 2017-03-27 ENCOUNTER — Other Ambulatory Visit: Payer: Self-pay | Admitting: Gastroenterology

## 2017-03-27 DIAGNOSIS — B182 Chronic viral hepatitis C: Secondary | ICD-10-CM

## 2017-04-10 ENCOUNTER — Ambulatory Visit
Admission: RE | Admit: 2017-04-10 | Discharge: 2017-04-10 | Disposition: A | Discharge status: Home / Self Care | Payer: Medicaid Other | Source: Ambulatory Visit | Attending: Gastroenterology | Admitting: Gastroenterology

## 2017-04-10 DIAGNOSIS — B182 Chronic viral hepatitis C: Secondary | ICD-10-CM | POA: Diagnosis present

## 2017-04-17 ENCOUNTER — Other Ambulatory Visit: Payer: Self-pay | Admitting: Gastroenterology

## 2017-04-17 DIAGNOSIS — R935 Abnormal findings on diagnostic imaging of other abdominal regions, including retroperitoneum: Secondary | ICD-10-CM

## 2017-05-01 ENCOUNTER — Ambulatory Visit
Admission: RE | Admit: 2017-05-01 | Discharge: 2017-05-01 | Disposition: A | Discharge status: Home / Self Care | Payer: Medicaid Other | Source: Ambulatory Visit | Attending: Gastroenterology | Admitting: Gastroenterology

## 2017-05-01 DIAGNOSIS — K769 Liver disease, unspecified: Secondary | ICD-10-CM | POA: Insufficient documentation

## 2017-05-01 DIAGNOSIS — I700 Atherosclerosis of aorta: Secondary | ICD-10-CM | POA: Insufficient documentation

## 2017-05-01 DIAGNOSIS — R935 Abnormal findings on diagnostic imaging of other abdominal regions, including retroperitoneum: Secondary | ICD-10-CM

## 2017-05-01 MED ORDER — GADOBENATE DIMEGLUMINE 529 MG/ML IV SOLN
20.0000 mL | Freq: Once | INTRAVENOUS | Status: AC | PRN
  Administered 2017-05-01: 18 mL via INTRAVENOUS

## 2017-07-02 ENCOUNTER — Emergency Department: Payer: Medicaid Other

## 2017-07-02 ENCOUNTER — Inpatient Hospital Stay
Admission: EM | Admit: 2017-07-02 | Discharge: 2017-07-08 | DRG: 190 | Disposition: A | Discharge status: Home Health | Payer: Medicaid Other | Attending: Internal Medicine | Admitting: Internal Medicine

## 2017-07-02 ENCOUNTER — Encounter: Payer: Self-pay | Admitting: Emergency Medicine

## 2017-07-02 DIAGNOSIS — I5022 Chronic systolic (congestive) heart failure: Secondary | ICD-10-CM | POA: Diagnosis present

## 2017-07-02 DIAGNOSIS — R0603 Acute respiratory distress: Secondary | ICD-10-CM

## 2017-07-02 DIAGNOSIS — Z7982 Long term (current) use of aspirin: Secondary | ICD-10-CM

## 2017-07-02 DIAGNOSIS — Z79899 Other long term (current) drug therapy: Secondary | ICD-10-CM | POA: Diagnosis not present

## 2017-07-02 DIAGNOSIS — G4730 Sleep apnea, unspecified: Secondary | ICD-10-CM | POA: Diagnosis present

## 2017-07-02 DIAGNOSIS — F419 Anxiety disorder, unspecified: Secondary | ICD-10-CM | POA: Diagnosis present

## 2017-07-02 DIAGNOSIS — J441 Chronic obstructive pulmonary disease with (acute) exacerbation: Secondary | ICD-10-CM | POA: Diagnosis present

## 2017-07-02 DIAGNOSIS — Z7984 Long term (current) use of oral hypoglycemic drugs: Secondary | ICD-10-CM | POA: Diagnosis not present

## 2017-07-02 DIAGNOSIS — E785 Hyperlipidemia, unspecified: Secondary | ICD-10-CM | POA: Diagnosis present

## 2017-07-02 DIAGNOSIS — I110 Hypertensive heart disease with heart failure: Secondary | ICD-10-CM | POA: Diagnosis present

## 2017-07-02 DIAGNOSIS — R05 Cough: Secondary | ICD-10-CM | POA: Diagnosis present

## 2017-07-02 DIAGNOSIS — F17210 Nicotine dependence, cigarettes, uncomplicated: Secondary | ICD-10-CM | POA: Diagnosis present

## 2017-07-02 DIAGNOSIS — Z9981 Dependence on supplemental oxygen: Secondary | ICD-10-CM

## 2017-07-02 DIAGNOSIS — I2510 Atherosclerotic heart disease of native coronary artery without angina pectoris: Secondary | ICD-10-CM | POA: Diagnosis present

## 2017-07-02 DIAGNOSIS — I252 Old myocardial infarction: Secondary | ICD-10-CM

## 2017-07-02 DIAGNOSIS — R059 Cough, unspecified: Secondary | ICD-10-CM

## 2017-07-02 DIAGNOSIS — E119 Type 2 diabetes mellitus without complications: Secondary | ICD-10-CM | POA: Diagnosis present

## 2017-07-02 DIAGNOSIS — J9621 Acute and chronic respiratory failure with hypoxia: Secondary | ICD-10-CM | POA: Diagnosis present

## 2017-07-02 DIAGNOSIS — Z955 Presence of coronary angioplasty implant and graft: Secondary | ICD-10-CM

## 2017-07-02 LAB — CBC WITH DIFFERENTIAL/PLATELET
BASOS PCT: 1 %
Basophils Absolute: 0 10*3/uL (ref 0–0.1)
EOS ABS: 0.6 10*3/uL (ref 0–0.7)
EOS PCT: 7 %
HEMATOCRIT: 40.6 % (ref 40.0–52.0)
Hemoglobin: 13.3 g/dL (ref 13.0–18.0)
Lymphocytes Relative: 15 %
Lymphs Abs: 1.3 10*3/uL (ref 1.0–3.6)
MCH: 30.3 pg (ref 26.0–34.0)
MCHC: 32.7 g/dL (ref 32.0–36.0)
MCV: 92.6 fL (ref 80.0–100.0)
MONO ABS: 0.9 10*3/uL (ref 0.2–1.0)
MONOS PCT: 10 %
NEUTROS ABS: 5.7 10*3/uL (ref 1.4–6.5)
Neutrophils Relative %: 67 %
PLATELETS: 256 10*3/uL (ref 150–440)
RBC: 4.38 MIL/uL — ABNORMAL LOW (ref 4.40–5.90)
RDW: 14.3 % (ref 11.5–14.5)
WBC: 8.5 10*3/uL (ref 3.8–10.6)

## 2017-07-02 LAB — COMPREHENSIVE METABOLIC PANEL
ALBUMIN: 3.6 g/dL (ref 3.5–5.0)
ALK PHOS: 62 U/L (ref 38–126)
ALT: 16 U/L — AB (ref 17–63)
AST: 20 U/L (ref 15–41)
Anion gap: 10 (ref 5–15)
BILIRUBIN TOTAL: 0.6 mg/dL (ref 0.3–1.2)
BUN: 12 mg/dL (ref 6–20)
CALCIUM: 8.4 mg/dL — AB (ref 8.9–10.3)
CO2: 25 mmol/L (ref 22–32)
CREATININE: 0.76 mg/dL (ref 0.61–1.24)
Chloride: 101 mmol/L (ref 101–111)
GFR calc Af Amer: >60 mL/min (ref >60)
GFR calc non Af Amer: >60 mL/min (ref >60)
GLUCOSE: 123 mg/dL — AB (ref 65–99)
Potassium: 4 mmol/L (ref 3.5–5.1)
SODIUM: 136 mmol/L (ref 135–145)
Total Protein: 7.6 g/dL (ref 6.5–8.1)

## 2017-07-02 LAB — BLOOD GAS, ARTERIAL
ACID-BASE EXCESS: 5.7 mmol/L — AB (ref 0.0–2.0)
Bicarbonate: 31.7 mmol/L — ABNORMAL HIGH (ref 20.0–28.0)
Delivery systems: POSITIVE
FIO2: 0.4
O2 SAT: 98.3 %
Patient temperature: 37
pCO2 arterial: 50 mmHg — ABNORMAL HIGH (ref 32.0–48.0)
pH, Arterial: 7.41 (ref 7.350–7.450)
pO2, Arterial: 110 mmHg — ABNORMAL HIGH (ref 83.0–108.0)

## 2017-07-02 LAB — TROPONIN I

## 2017-07-02 LAB — BRAIN NATRIURETIC PEPTIDE: B Natriuretic Peptide: 101 pg/mL — ABNORMAL HIGH (ref 0.0–100.0)

## 2017-07-02 MED ORDER — ACETAMINOPHEN 325 MG PO TABS
650.0000 mg | ORAL_TABLET | Freq: Once | ORAL | Status: AC
Start: 2017-07-03 — End: 2017-07-02
  Administered 2017-07-02: 650 mg via ORAL
  Filled 2017-07-02: qty 2

## 2017-07-02 MED ORDER — INSULIN ASPART 100 UNIT/ML ~~LOC~~ SOLN
0.0000 [IU] | Freq: Three times a day (TID) | SUBCUTANEOUS | Status: DC
  Administered 2017-07-03: 4 [IU] via SUBCUTANEOUS
  Administered 2017-07-03 – 2017-07-04 (×4): 3 [IU] via SUBCUTANEOUS
  Administered 2017-07-05: 4 [IU] via SUBCUTANEOUS
  Administered 2017-07-05: 3 [IU] via SUBCUTANEOUS
  Administered 2017-07-06: 4 [IU] via SUBCUTANEOUS
  Administered 2017-07-07: 7 [IU] via SUBCUTANEOUS
  Administered 2017-07-07 – 2017-07-08 (×4): 3 [IU] via SUBCUTANEOUS
  Filled 2017-07-02 (×13): qty 1

## 2017-07-02 MED ORDER — ALBUTEROL SULFATE (2.5 MG/3ML) 0.083% IN NEBU
5.0000 mg | INHALATION_SOLUTION | Freq: Once | RESPIRATORY_TRACT | Status: AC
  Administered 2017-07-02: 5 mg via RESPIRATORY_TRACT

## 2017-07-02 MED ORDER — NICOTINE 21 MG/24HR TD PT24
21.0000 mg | MEDICATED_PATCH | Freq: Every day | TRANSDERMAL | Status: DC
  Administered 2017-07-03 – 2017-07-08 (×6): 21 mg via TRANSDERMAL
  Filled 2017-07-02 (×6): qty 1

## 2017-07-02 MED ORDER — INSULIN GLARGINE 100 UNIT/ML ~~LOC~~ SOLN
25.0000 [IU] | Freq: Every day | SUBCUTANEOUS | Status: DC
  Administered 2017-07-03 – 2017-07-07 (×6): 25 [IU] via SUBCUTANEOUS
  Filled 2017-07-02 (×8): qty 0.25

## 2017-07-02 MED ORDER — ALBUTEROL SULFATE (2.5 MG/3ML) 0.083% IN NEBU
5.0000 mg | INHALATION_SOLUTION | Freq: Once | RESPIRATORY_TRACT | Status: AC
  Administered 2017-07-02: 5 mg via RESPIRATORY_TRACT
  Filled 2017-07-02: qty 6

## 2017-07-02 MED ORDER — METHYLPREDNISOLONE SODIUM SUCC 125 MG IJ SOLR
60.0000 mg | Freq: Four times a day (QID) | INTRAMUSCULAR | Status: DC
  Administered 2017-07-03 – 2017-07-05 (×11): 60 mg via INTRAVENOUS
  Filled 2017-07-02 (×11): qty 2

## 2017-07-02 NOTE — Progress Notes (Signed)
Received order from Dr Alphonzo LemmingsMcShane to wean patient off bipap. Patient on nasal cannula at 2 liters. Patient wears 2 liters at home. Patient tolerated well. bipap on standby. o2 sat noted at 95

## 2017-07-02 NOTE — ED Triage Notes (Signed)
Patient arrives from home with co sob for the last hour.  Pt states "I quit smoking today".  EMS states pt was on 2L O2 at home and taking albuterol with de-satted into the 80's.  EMS placed patient on CPAP with 125mg  IV Solu Medrol and patient corrected to 100%.  Pt has COPD hx.  Pt AOx4.

## 2017-07-02 NOTE — ED Notes (Signed)
ED Provider at bedside. 

## 2017-07-02 NOTE — ED Notes (Signed)
XR at bedside

## 2017-07-02 NOTE — ED Provider Notes (Addendum)
[facility] Emergency Department Provider Note  ____________________________________________   I have reviewed the triage vital signs and the nursing notes.   HISTORY  Chief Complaint No chief complaint on file.    HPI Kyle Rich is a 61 y.o. male who presents today complaining of shortness of breath patient has a history of CHF and COPD he has had no leg swelling but he has been coughing over the last 3 hours he has gotten acutely worse he states.  Patient is on home oxygen he was found to be in the 80s on home oxygen for EMS.  They gave him albuterol, as well as Solu-Medrol and placed on BiPAP and is doing much better.  Patient was tripoding and very ill.  He feels this is more likely COPD and CHF.  He denies fever.  Cough is not particularly productive.  Very limited history given patient respiratory status.  Level 5 chart caveat; no further history available due to patient status.    Past Medical History:  Diagnosis Date  . Arthritis   . Asthma   . CHF (congestive heart failure) (HCC)   . COPD (chronic obstructive pulmonary disease) (HCC)   . Diabetes mellitus without complication (HCC)   . Hepatitis C   . Hypertension     Patient Active Problem List   Diagnosis Date Noted  . NSTEMI (non-ST elevated myocardial infarction) (HCC) 06/15/2016  . COPD exacerbation (HCC) 06/09/2015    Past Surgical History:  Procedure Laterality Date  . CARDIAC CATHETERIZATION N/A 06/15/2016   Procedure: Left Heart Cath and Coronary Angiography PCI;  Surgeon: Alwyn Pea, MD;  Location: [facility] INVASIVE CV LAB;  Service: Cardiovascular;  Laterality: N/A;    Prior to Admission medications   Medication Sig Start Date End Date Taking? Authorizing Provider  albuterol (PROAIR HFA) 108 (90 BASE) MCG/ACT inhaler Inhale 2 puffs into the lungs every 6 (six) hours as needed for wheezing.     [provider]  albuterol (PROVENTIL) (2.5 MG/3ML) 0.083% nebulizer  solution Inhale 3 mLs into the lungs every 4 (four) hours as needed for wheezing.     [provider]  aspirin EC 325 MG EC tablet Take 1 tablet (325 mg total) by mouth daily. 06/16/16   Houston Siren, MD  atorvastatin (LIPITOR) 40 MG tablet Take 1 tablet (40 mg total) by mouth daily at 6 PM. 06/16/16   Sainani, Rolly Pancake, MD  hydrochlorothiazide (HYDRODIURIL) 25 MG tablet Take 12.5 mg by mouth daily.    [provider]  lisinopril (PRINIVIL,ZESTRIL) 10 MG tablet Take 10 mg by mouth daily.    [provider]  metFORMIN (GLUCOPHAGE) 500 MG tablet Take by mouth 2 (two) times daily with a meal.    [provider]  metoprolol tartrate (LOPRESSOR) 25 MG tablet Take 1 tablet (25 mg total) by mouth 2 (two) times daily. 06/15/16   Houston Siren, MD  potassium chloride SA (K-DUR,KLOR-CON) 20 MEQ tablet Take 20 mEq by mouth daily.    [provider]    Allergies Patient has no known allergies.  Family History  Problem Relation Age of Onset  . Bladder Cancer Mother   . Lung cancer Father     Social History Social History  Substance Use Topics  . Smoking status: Current Every Day Smoker    Packs/day: 0.50    Years: 40.00    Types: Cigarettes  . Smokeless tobacco: Never Used  . Alcohol use 0.6 oz/week  1 Cans of beer per week    Review of Systems Constitutional: No fever/chills Eyes: No visual changes. ENT: No sore throat. No stiff neck no neck pain Cardiovascular: Denies chest pain. Respiratory: See HPI regarding shortness of breath. Gastrointestinal:   no vomiting.  No diarrhea.  No constipation. Genitourinary: Negative for dysuria. Musculoskeletal: Negative lower extremity swelling Skin: Negative for rash. Neurological: Negative for severe headaches, focal weakness or numbness.   ____________________________________________   PHYSICAL EXAM:  VITAL SIGNS: ED Triage Vitals  Enc Vitals Group     BP      Pulse      Resp       Temp      Temp src      SpO2      Weight      Height      Head Circumference      Peak Flow      Pain Score      Pain Loc      Pain Edu?      Excl. in GC?     Constitutional: Alert and oriented ill appearing, speaks in 3 word sentences Eyes: Conjunctivae are normal Head: Atraumatic HEENT: No congestion/rhinnorhea. Mucous membranes are moist.  Oropharynx non-erythematous Neck:   Nontender with no meningismus, no masses, no stridor Cardiovascular: Normal rate, regular rhythm. Grossly normal heart sounds.  Good peripheral circulation. Respiratory: Patient in respiratory distress, although he is calm he is working to breathe, very diminished in the bases with occasional wheezes no rales or rhonchi appreciated initially Abdominal: Soft and nontender. No distention. No guarding no rebound Back:  There is no focal tenderness or step off.  there is no midline tenderness there are no lesions noted. there is no CVA tenderness Musculoskeletal: No lower extremity tenderness, no upper extremity tenderness. No joint effusions, no DVT signs strong distal pulses no edema Neurologic:  Normal speech and language. No gross focal neurologic deficits are appreciated.  Skin:  Skin is warm, dry and intact. No rash noted. Psychiatric: Mood and affect are normal. Speech and behavior are normal.  ____________________________________________   LABS (all labs ordered are listed, but only abnormal results are displayed)  Labs Reviewed  CULTURE, BLOOD (ROUTINE X 2)  CULTURE, BLOOD (ROUTINE X 2)  COMPREHENSIVE METABOLIC PANEL  CBC WITH DIFFERENTIAL/PLATELET  BRAIN NATRIURETIC PEPTIDE  TROPONIN I  BLOOD GAS, ARTERIAL    Pertinent labs  results that were available during my care of the patient were reviewed by me and considered in my medical decision making (see chart for details). ____________________________________________  EKG  I personally interpreted any EKGs ordered by me or triage Sinus  rhythm, rate 98 no acute ST elevation depression nonspecific ST changes, borderline ST depression laterally could be consistent with strain pattern ____________________________________________  RADIOLOGY  Pertinent labs & imaging results that were available during my care of the patient were reviewed by me and considered in my medical decision making (see chart for details). If possible, patient and/or family made aware of any abnormal findings. ____________________________________________    PROCEDURES  Procedure(s) performed: None  Procedures  Critical Care performed: CRITICAL CARE Performed by: Jeanmarie PlantJAMES A Charna Neeb   Total critical care time: 45 minutes  Critical care time was exclusive of separately billable procedures and treating other patients.  Critical care was necessary to treat or prevent imminent or life-threatening deterioration.  Critical care was time spent personally by me on the following activities: development of treatment plan with patient and/or surrogate as well as  nursing, discussions with consultants, evaluation of patient's response to treatment, examination of patient, obtaining history from patient or surrogate, ordering and performing treatments and interventions, ordering and review of laboratory studies, ordering and review of radiographic studies, pulse oximetry and re-evaluation of patient's condition.   ____________________________________________   INITIAL IMPRESSION / ASSESSMENT AND PLAN / ED COURSE  Pertinent labs & imaging results that were available during my care of the patient were reviewed by me and considered in my medical decision making (see chart for details).  Patient here with most likely COPD exacerbation, we have placed him on BiPAP, I am giving him albuterol, he is already had Solu-Medrol, chest x-ray is pending VBG is pending, patient is acutely ill but is my hope he will turn around with appropriate therapy.  Most likely this is COPD  however CHF is also consideration low suspicion for PE.  ----------------------------------------- 9:30 PM on 07/02/2017 -----------------------------------------  Patient feeling much better on BiPAP, chest x-ray blood work thus far reassuring troponin reassuring, ABG reassuring is my hope that we can wean him off he will need to be admitted.  I do not think antibiotics are necessarily indicated no fever no active cough and no white count no infiltrate, new  Clinical Course as of Jul 02 2141  Sun Jul 02, 2017  2142 Patient doing well on BiPAP hopefully we can wean him off soon,  [JM]    Clinical Course User Index [JM] Jeanmarie Plant, MD   __________  ----------------------------------------- 11:33 PM on 07/02/2017 -----------------------------------------  Off BiPAP doing well at this time, oncoming MD will monitor pending admisison (dr. York Cerise) __________________________________   FINAL CLINICAL IMPRESSION(S) / ED DIAGNOSES  Final diagnoses:  Cough      This chart was dictated using voice recognition software.  Despite best efforts to proofread,  errors can occur which can change meaning.      Jeanmarie Plant, MD 07/02/17 2007    Jeanmarie Plant, MD 07/02/17 2009    Jeanmarie Plant, MD 07/02/17 2131    Jeanmarie Plant, MD 07/02/17 2142    Jeanmarie Plant, MD 07/02/17 [phone]

## 2017-07-02 NOTE — ED Notes (Signed)
Patient co muscle spasm chest pain under left breast, MD aware and VORB received for 650mg  Tylenol PO

## 2017-07-02 NOTE — ED Notes (Signed)
Patient weaned to 2L [place] by RT.  BiPap on standby.

## 2017-07-03 ENCOUNTER — Inpatient Hospital Stay: Payer: Medicaid Other

## 2017-07-03 LAB — TROPONIN I
TROPONIN I: 0.03 ng/mL — AB (ref <0.03)
Troponin I: 0.03 ng/mL (ref <0.03)
Troponin I: <0.03 ng/mL (ref <0.03)
Troponin I: <0.03 ng/mL (ref <0.03)

## 2017-07-03 LAB — GLUCOSE, CAPILLARY
GLUCOSE-CAPILLARY: 177 mg/dL — AB (ref 65–99)
GLUCOSE-CAPILLARY: 150 mg/dL — AB (ref 65–99)
Glucose-Capillary: 143 mg/dL — ABNORMAL HIGH (ref 65–99)
Glucose-Capillary: 157 mg/dL — ABNORMAL HIGH (ref 65–99)
Glucose-Capillary: 116 mg/dL — ABNORMAL HIGH (ref 65–99)

## 2017-07-03 LAB — CREATININE, SERUM: Creatinine, Ser: 0.93 mg/dL (ref 0.61–1.24)

## 2017-07-03 LAB — HEMOGLOBIN A1C
Hgb A1c MFr Bld: 6.6 % — ABNORMAL HIGH (ref 4.8–5.6)
Mean Plasma Glucose: 142.72 mg/dL

## 2017-07-03 LAB — CBC
HCT: 40.5 % (ref 40.0–52.0)
Hemoglobin: 13.4 g/dL (ref 13.0–18.0)
MCH: 31 pg (ref 26.0–34.0)
MCHC: 33.2 g/dL (ref 32.0–36.0)
MCV: 93.5 fL (ref 80.0–100.0)
PLATELETS: 263 10*3/uL (ref 150–440)
RBC: 4.34 MIL/uL — ABNORMAL LOW (ref 4.40–5.90)
RDW: 14.7 % — AB (ref 11.5–14.5)
WBC: 5.5 10*3/uL (ref 3.8–10.6)

## 2017-07-03 LAB — BLOOD GAS, ARTERIAL
Acid-Base Excess: 2.4 mmol/L — ABNORMAL HIGH (ref 0.0–2.0)
Bicarbonate: 28.4 mmol/L — ABNORMAL HIGH (ref 20.0–28.0)
FIO2: 0.28
O2 Saturation: 93.4 %
PCO2 ART: 48 mmHg (ref 32.0–48.0)
PH ART: 7.38 (ref 7.350–7.450)
PO2 ART: 70 mmHg — AB (ref 83.0–108.0)
Patient temperature: 37

## 2017-07-03 MED ORDER — ATORVASTATIN CALCIUM 20 MG PO TABS
40.0000 mg | ORAL_TABLET | Freq: Every day | ORAL | Status: DC
  Administered 2017-07-03 – 2017-07-07 (×5): 40 mg via ORAL
  Filled 2017-07-03 (×5): qty 2

## 2017-07-03 MED ORDER — GUAIFENESIN ER 600 MG PO TB12
600.0000 mg | ORAL_TABLET | Freq: Two times a day (BID) | ORAL | Status: DC
  Administered 2017-07-03 – 2017-07-08 (×11): 600 mg via ORAL
  Filled 2017-07-03 (×11): qty 1

## 2017-07-03 MED ORDER — ONDANSETRON HCL 4 MG/2ML IJ SOLN: 4.0000 mg | Freq: Four times a day (QID) | INTRAMUSCULAR | Status: DC | PRN

## 2017-07-03 MED ORDER — HYDROCHLOROTHIAZIDE 25 MG PO TABS
12.5000 mg | ORAL_TABLET | Freq: Every day | ORAL | Status: DC
Start: 2017-07-03 — End: 2017-07-08
  Administered 2017-07-03 – 2017-07-08 (×6): 12.5 mg via ORAL
  Filled 2017-07-03 (×6): qty 1

## 2017-07-03 MED ORDER — POLYETHYLENE GLYCOL 3350 17 G PO PACK
17.0000 g | PACK | Freq: Every day | ORAL | Status: DC | PRN
Start: 2017-07-03 — End: 2017-07-08

## 2017-07-03 MED ORDER — SODIUM CHLORIDE 0.9% FLUSH
3.0000 mL | Freq: Two times a day (BID) | INTRAVENOUS | Status: DC
  Administered 2017-07-03 – 2017-07-08 (×12): 3 mL via INTRAVENOUS

## 2017-07-03 MED ORDER — DEXTROSE 5 % IV SOLN
500.0000 mg | INTRAVENOUS | Status: DC
  Administered 2017-07-03: 500 mg via INTRAVENOUS
  Filled 2017-07-03 (×2): qty 500

## 2017-07-03 MED ORDER — LISINOPRIL 10 MG PO TABS
10.0000 mg | ORAL_TABLET | Freq: Every day | ORAL | Status: DC
  Administered 2017-07-03 – 2017-07-08 (×6): 10 mg via ORAL
  Filled 2017-07-03 (×6): qty 1

## 2017-07-03 MED ORDER — ACETAMINOPHEN 650 MG RE SUPP: 650.0000 mg | Freq: Four times a day (QID) | RECTAL | Status: DC | PRN

## 2017-07-03 MED ORDER — SODIUM CHLORIDE 0.9% FLUSH: 3.0000 mL | INTRAVENOUS | Status: DC | PRN

## 2017-07-03 MED ORDER — IPRATROPIUM-ALBUTEROL 0.5-2.5 (3) MG/3ML IN SOLN
3.0000 mL | RESPIRATORY_TRACT | Status: DC | PRN
  Administered 2017-07-03 – 2017-07-06 (×3): 3 mL via RESPIRATORY_TRACT
  Filled 2017-07-03 (×3): qty 3

## 2017-07-03 MED ORDER — POTASSIUM CHLORIDE CRYS ER 20 MEQ PO TBCR
20.0000 meq | EXTENDED_RELEASE_TABLET | Freq: Every day | ORAL | Status: DC
  Administered 2017-07-03 – 2017-07-08 (×6): 20 meq via ORAL
  Filled 2017-07-03 (×6): qty 1

## 2017-07-03 MED ORDER — ACETAMINOPHEN 325 MG PO TABS: 650.0000 mg | ORAL_TABLET | Freq: Four times a day (QID) | ORAL | Status: DC | PRN

## 2017-07-03 MED ORDER — BUDESONIDE 0.5 MG/2ML IN SUSP
0.5000 mg | Freq: Two times a day (BID) | RESPIRATORY_TRACT | Status: DC
  Administered 2017-07-03 – 2017-07-08 (×10): 0.5 mg via RESPIRATORY_TRACT
  Filled 2017-07-03 (×10): qty 2

## 2017-07-03 MED ORDER — SODIUM CHLORIDE 0.9 % IV SOLN: 250.0000 mL | INTRAVENOUS | Status: DC | PRN

## 2017-07-03 MED ORDER — ASPIRIN EC 325 MG PO TBEC
325.0000 mg | DELAYED_RELEASE_TABLET | Freq: Every day | ORAL | Status: DC
  Administered 2017-07-03 – 2017-07-08 (×6): 325 mg via ORAL
  Filled 2017-07-03 (×6): qty 1

## 2017-07-03 MED ORDER — ONDANSETRON HCL 4 MG PO TABS: 4.0000 mg | ORAL_TABLET | Freq: Four times a day (QID) | ORAL | Status: DC | PRN

## 2017-07-03 MED ORDER — IPRATROPIUM-ALBUTEROL 0.5-2.5 (3) MG/3ML IN SOLN
3.0000 mL | Freq: Four times a day (QID) | RESPIRATORY_TRACT | Status: DC
  Administered 2017-07-03 – 2017-07-08 (×21): 3 mL via RESPIRATORY_TRACT
  Filled 2017-07-03 (×22): qty 3

## 2017-07-03 MED ORDER — BUPRENORPHINE HCL 8 MG SL SUBL
8.0000 mg | SUBLINGUAL_TABLET | Freq: Two times a day (BID) | SUBLINGUAL | Status: DC
  Administered 2017-07-03 – 2017-07-08 (×10): 8 mg via SUBLINGUAL
  Filled 2017-07-03 (×10): qty 1

## 2017-07-03 MED ORDER — IOPAMIDOL (ISOVUE-370) INJECTION 76%
75.0000 mL | Freq: Once | INTRAVENOUS | Status: AC | PRN
  Administered 2017-07-03: 75 mL via INTRAVENOUS

## 2017-07-03 MED ORDER — AZITHROMYCIN 250 MG PO TABS
500.0000 mg | ORAL_TABLET | Freq: Every day | ORAL | Status: AC
  Administered 2017-07-04 – 2017-07-05 (×2): 500 mg via ORAL
  Filled 2017-07-03 (×2): qty 2

## 2017-07-03 MED ORDER — ENOXAPARIN SODIUM 40 MG/0.4ML ~~LOC~~ SOLN
40.0000 mg | SUBCUTANEOUS | Status: DC
  Administered 2017-07-03 – 2017-07-08 (×5): 40 mg via SUBCUTANEOUS
  Filled 2017-07-03 (×5): qty 0.4

## 2017-07-03 MED ORDER — METOPROLOL TARTRATE 25 MG PO TABS
25.0000 mg | ORAL_TABLET | Freq: Two times a day (BID) | ORAL | Status: DC
  Administered 2017-07-03 – 2017-07-08 (×11): 25 mg via ORAL
  Filled 2017-07-03 (×11): qty 1

## 2017-07-03 NOTE — H&P (Signed)
[facility] - [place] at [facility]   PATIENT NAME: Kyle Rich    MR#:  [id number]  DATE OF BIRTH:  1956/03/04  DATE OF ADMISSION:  07/02/2017  PRIMARY CARE PHYSICIAN: Rayetta Humphrey, MD   REQUESTING/REFERRING PHYSICIAN:   CHIEF COMPLAINT:   Chief Complaint  Patient presents with  . Shortness of Breath    HISTORY OF PRESENT ILLNESS: Kyle Rich  is a 61 y.o. male with a known history of COPD, chronic respiratory failure on 2 L at home on, was brought to the emergency room with 1 day history of worsening shortness of breath, patient was noted to be in the 80s by EMS, was given IV Solu-Medrol/DuoNeb and placed on BiPAP, in the emergency room patient was noted to be tripoding, ER workup noted for chronic interstitial opacity on chest x-ray/no acute process, EKG was negative, blood gas noted for compensation, BNP 101, troponin was negative, hospital service subsequent contacted for further evaluation/care, patient evaluated in emergency room, in no apparent distress, patient currently on oxygen via nasal cannula, patient admits to continued tobacco smoking, patient is now admitted for acute on COPD exacerbation.  PAST MEDICAL HISTORY:   Past Medical History:  Diagnosis Date  . Arthritis   . Asthma   . CHF (congestive heart failure) (HCC)   . COPD (chronic obstructive pulmonary disease) (HCC)   . Diabetes mellitus without complication (HCC)   . Hepatitis C   . Hypertension     PAST SURGICAL HISTORY: Past Surgical History:  Procedure Laterality Date  . CARDIAC CATHETERIZATION N/A 06/15/2016   Procedure: Left Heart Cath and Coronary Angiography PCI;  Surgeon: Alwyn Pea, MD;  Location: [facility] INVASIVE CV LAB;  Service: Cardiovascular;  Laterality: N/A;    SOCIAL HISTORY:  Social History  Substance Use Topics  . Smoking status: Current Every Day Smoker    Packs/day: 0.50    Years: 40.00    Types: Cigarettes  . Smokeless tobacco: Never Used  . Alcohol use  0.6 oz/week    1 Cans of beer per week    FAMILY HISTORY:  Family History  Problem Relation Age of Onset  . Bladder Cancer Mother   . Lung cancer Father     DRUG ALLERGIES: No Known Allergies  REVIEW OF SYSTEMS:   CONSTITUTIONAL: No fever, +fatigue and generalizedweakness.  EYES: No blurred or double vision.  EARS, NOSE, AND THROAT: No tinnitus or ear pain.  RESPIRATORY: + Nonproductive cough, shortness of breath, wheezing, no hemoptysis.  CARDIOVASCULAR: Chest tightness with deep breathing,no orthopnea, edema.  GASTROINTESTINAL: No nausea, vomiting, diarrhea or abdominal pain.  GENITOURINARY: No dysuria, hematuria.  ENDOCRINE: No polyuria, nocturia,  HEMATOLOGY: No anemia, easy bruising or bleeding SKIN: No rash or lesion. MUSCULOSKELETAL: No joint pain or arthritis.   NEUROLOGIC: No tingling, numbness, weakness.  PSYCHIATRY: No anxiety or depression.   MEDICATIONS AT HOME:  Prior to Admission medications   Medication Sig Start Date End Date Taking? Authorizing Provider  albuterol (PROAIR HFA) 108 (90 BASE) MCG/ACT inhaler Inhale 2 puffs into the lungs every 6 (six) hours as needed for wheezing.    Yes [provider]  albuterol (PROVENTIL) (2.5 MG/3ML) 0.083% nebulizer solution Inhale 3 mLs into the lungs every 4 (four) hours as needed for wheezing.    Yes [provider]  aspirin EC 325 MG EC tablet Take 1 tablet (325 mg total) by mouth daily. 06/16/16  Yes Houston Siren, MD  atorvastatin (LIPITOR) 40 MG tablet Take 1  tablet (40 mg total) by mouth daily at 6 PM. 06/16/16  Yes Sainani, Rolly PancakeVivek J, MD  hydrochlorothiazide (HYDRODIURIL) 25 MG tablet Take 12.5 mg by mouth daily.   Yes [provider]  lisinopril (PRINIVIL,ZESTRIL) 10 MG tablet Take 10 mg by mouth daily.   Yes [provider]  metFORMIN (GLUCOPHAGE) 500 MG tablet Take by mouth 2 (two) times daily with a meal.   Yes [provider]  metoprolol tartrate (LOPRESSOR) 25 MG  tablet Take 1 tablet (25 mg total) by mouth 2 (two) times daily. 06/15/16  Yes Sainani, Rolly PancakeVivek J, MD  potassium chloride SA (K-DUR,KLOR-CON) 20 MEQ tablet Take 20 mEq by mouth daily.   Yes [provider]  SUBOXONE 8-2 MG FILM Place 1 Film (8 mg total) under the tongue 2 (two) times daily NADEAN [id number] 06/20/17  Yes [provider]      PHYSICAL EXAMINATION:   VITAL SIGNS: Blood pressure 131/69, pulse 90, temperature 97.9 F (36.6 C), temperature source Axillary, resp. rate (!) 21, SpO2 93 %.  GENERAL:  61 y.o.-year-old patient lying in the bed with no acute distress.  Obese, nontoxic-appearing EYES: Pupils equal, round, reactive to light and accommodation. No scleral icterus. Extraocular muscles intact.  HEENT: Head atraumatic, normocephalic. Oropharynx and nasopharynx clear.  NECK:  Supple, no jugular venous distention. No thyroid enlargement, no tenderness.  LUNGS: Severely diminished breath sounds throughout, + wheezing/rhonchi, no crepitation. +use of accessory muscles of respiration.  CARDIOVASCULAR: S1, S2 normal. No murmurs, rubs, or gallops.  ABDOMEN: Soft, nontender, nondistended. Bowel sounds present. No organomegaly or mass.  EXTREMITIES: No pedal edema, cyanosis, or clubbing.  NEUROLOGIC: Cranial nerves II through XII are intact. Muscle strength 5/5 in all extremities. Sensation intact. Gait not checked.  PSYCHIATRIC: The patient is alert and oriented x 3.  SKIN: No obvious rash, lesion, or ulcer.   LABORATORY PANEL:   CBC  Recent Labs Lab 07/02/17 1958  WBC 8.5  HGB 13.3  HCT 40.6  PLT 256  MCV 92.6  MCH 30.3  MCHC 32.7  RDW 14.3  LYMPHSABS 1.3  MONOABS 0.9  EOSABS 0.6  BASOSABS 0.0   ------------------------------------------------------------------------------------------------------------------  Chemistries   Recent Labs Lab 07/02/17 1958  NA 136  K 4.0  CL 101  CO2 25  GLUCOSE 123*  BUN 12  CREATININE 0.76  CALCIUM 8.4*   AST 20  ALT 16*  ALKPHOS 62  BILITOT 0.6   ------------------------------------------------------------------------------------------------------------------ CrCl cannot be calculated (Unknown ideal weight.). ------------------------------------------------------------------------------------------------------------------ No results for input(s): TSH, T4TOTAL, T3FREE, THYROIDAB in the last 72 hours.  Invalid input(s): FREET3   Coagulation profile No results for input(s): INR, PROTIME in the last 168 hours. ------------------------------------------------------------------------------------------------------------------- No results for input(s): DDIMER in the last 72 hours. -------------------------------------------------------------------------------------------------------------------  Cardiac Enzymes  Recent Labs Lab 07/02/17 1958  TROPONINI <0.03   ------------------------------------------------------------------------------------------------------------------ Invalid input(s): POCBNP  ---------------------------------------------------------------------------------------------------------------  Urinalysis    Component Value Date/Time   COLORURINE Yellow 08/01/2012 1022   APPEARANCEUR Clear 08/01/2012 1022   LABSPEC 1.030 08/01/2012 1022   PHURINE 6.0 08/01/2012 1022   GLUCOSEU Negative 08/01/2012 1022   HGBUR Negative 08/01/2012 1022   BILIRUBINUR Negative 08/01/2012 1022   KETONESUR Negative 08/01/2012 1022   PROTEINUR Negative 08/01/2012 1022   NITRITE Negative 08/01/2012 1022   LEUKOCYTESUR Negative 08/01/2012 1022     RADIOLOGY: Dg Chest Port 1 View  Result Date: 07/02/2017 CLINICAL DATA:  Shortness of breath EXAM: PORTABLE CHEST 1 VIEW COMPARISON:  06/05/2016,  CT 06/15/2016 FINDINGS: Mild chronic interstitial  changes. No focal consolidation or effusion. Stable cardiomediastinal silhouette with atherosclerosis. No pneumothorax. IMPRESSION: Chronic  interstitial opacity.  No acute consolidation or edema. Electronically Signed   By: Jasmine Pang M.D.   On: 07/02/2017 20:50    EKG: Orders placed or performed during the hospital encounter of 07/02/17  . ED EKG  . ED EKG  . EKG 12-Lead  . EKG 12-Lead  . ED EKG  . ED EKG    IMPRESSION AND PLAN: 1 acute on COPD exacerbation Admit to regular nursing floor bed on her COPD protocol, empiric IV Solu-Medrol with tapering as tolerated, empiric azithromycin for 5-day course, aggressive pulmonary toilet with bronchodilator therapy, respiratory therapy to evaluate/treat, supplemental oxygen as needed-patient had 2 L via nasal cannula continuous at home, continue close medical monitoring  2 chronic tobacco smoking abuse/dependency Nicotine patch daily and cessation counseling ordered  3 chronic diabetes mellitus type 2 Hold metformin, start sliding scale insulin with Accu-Cheks per routine, start Lantus 25 units subcu at bedtime given IV Solu-Medrol usage, ADA/cardiac diet, make changes as per necessary  4 chronic congestive heart failure without exacerbation Stable Continue aspirin, statin therapy, lisinopril, Lopressor, hydrochlorothiazide  5 acute on chronic hypoxic respiratory failure Secondary to COPD exacerbation Plan of care as stated above Noted O2 requirement of 2 L via nasal cannula at home  6 chronic benign essential hypertension Stable continue hydrochlorothiazide , Lisinopril, and Lopressor   Full code Condition stable Prognosis fair DVT prophylaxis with Lovenox subcu disposition home in 1-3 days   All the records are reviewed and case discussed with ED provider. Management plans discussed with the patient, family and they are in agreement.  CODE STATUS: Code Status History    Date Active Date Inactive Code Status Order ID Comments User Context   06/15/2016 10:07 AM 06/15/2016 10:42 PM Full Code [id number]  Shaune Pollack, MD Inpatient   06/09/2015 11:59 AM 06/10/2015   3:27 PM Full Code [id number]  Houston Siren, MD Inpatient       TOTAL TIME TAKING CARE OF THIS PATIENT: 45 minutes.    Evelena Asa Ripley Lovecchio M.D on 07/03/2017   Between 7am to 6pm - Pager - [phone]  After 6pm go to [email or web address] - password [email or web address]  [facility]  Office  [phone]  CC: Primary care physician; Rayetta Humphrey, MD   Note: This dictation was prepared with Dragon dictation along with smaller phrase technology. Any transcriptional errors that result from this process are unintentional.

## 2017-07-03 NOTE — Plan of Care (Signed)
Problem: Physical Regulation: Goal: Ability to maintain clinical measurements within normal limits will improve Outcome: Progressing Pt had several episodes of increased SOB

## 2017-07-03 NOTE — Progress Notes (Signed)
1        [employer] [facility] - [place] at [facility]   PATIENT NAME: Kyle GuarneriJames Rich    MR#:  [id number]  DATE OF BIRTH:  Jul 21, 1956  SUBJECTIVE:  CHIEF COMPLAINT:   Chief Complaint  Patient presents with  . Shortness of Breath  Not breathing well, not hypoxic more than usual REVIEW OF SYSTEMS:  Review of Systems  Constitutional: Negative for chills, fever and weight loss.  HENT: Negative for nosebleeds and sore throat.   Eyes: Negative for blurred vision.  Respiratory: Positive for shortness of breath. Negative for cough and wheezing.   Cardiovascular: Negative for chest pain, orthopnea, leg swelling and PND.  Gastrointestinal: Negative for abdominal pain, constipation, diarrhea, heartburn, nausea and vomiting.  Genitourinary: Negative for dysuria and urgency.  Musculoskeletal: Negative for back pain.  Skin: Negative for rash.  Neurological: Negative for dizziness, speech change, focal weakness and headaches.  Endo/Heme/Allergies: Does not bruise/bleed easily.  Psychiatric/Behavioral: Negative for depression.   DRUG ALLERGIES:  No Known Allergies VITALS:  Blood pressure (!) 162/74, pulse 79, temperature 97.8 F (36.6 C), temperature source Oral, resp. rate 20, height 5\' 4"  (1.626 m), weight 85 kg (187 lb 6.4 oz), SpO2 92 %. PHYSICAL EXAMINATION:  Physical Exam  Constitutional: He is oriented to person, place, and time and well-developed, well-nourished, and in no distress.  HENT:  Head: Normocephalic and atraumatic.  Eyes: Pupils are equal, round, and reactive to light. Conjunctivae and EOM are normal.  Neck: Normal range of motion. Neck supple. No tracheal deviation present. No thyromegaly present.  Cardiovascular: Normal rate, regular rhythm and normal heart sounds.   Pulmonary/Chest: He is in respiratory distress. He has wheezes. He exhibits no tenderness.  Abdominal: Soft. Bowel sounds are normal. He exhibits no distension. There is no tenderness.    Musculoskeletal: Normal range of motion.  Neurological: He is alert and oriented to person, place, and time. No cranial nerve deficit.  Skin: Skin is warm and dry. No rash noted.  Psychiatric: Mood and affect normal.   LABORATORY PANEL:  Male CBC  Recent Labs Lab 07/03/17 0614  WBC 5.5  HGB 13.4  HCT 40.5  PLT 263   ------------------------------------------------------------------------------------------------------------------ Chemistries   Recent Labs Lab 07/02/17 1958 07/03/17 0614  NA 136  --   K 4.0  --   CL 101  --   CO2 25  --   GLUCOSE 123*  --   BUN 12  --   CREATININE 0.76 0.93  CALCIUM 8.4*  --   AST 20  --   ALT 16*  --   ALKPHOS 62  --   BILITOT 0.6  --    RADIOLOGY:  Dg Chest Port 1 View  Result Date: 07/02/2017 CLINICAL DATA:  Shortness of breath EXAM: PORTABLE CHEST 1 VIEW COMPARISON:  06/05/2016,  CT 06/15/2016 FINDINGS: Mild chronic interstitial changes. No focal consolidation or effusion. Stable cardiomediastinal silhouette with atherosclerosis. No pneumothorax. IMPRESSION: Chronic interstitial opacity.  No acute consolidation or edema. Electronically Signed   By: Jasmine PangKim  Fujinaga M.D.   On: 07/02/2017 20:50   ASSESSMENT AND PLAN:  61 y.o. male with a known history of COPD, chronic respiratory failure on 2 L at home being admitted for acute on chronic COPD exacerbation  * Acute on chronic respiratory failure due to COPD exacerbation - continue empiric IV Solu-Medrol with tapering as tolerated, empiric azithromycin for 5-day course, aggressive pulmonary toilet with bronchodilator therapy -We will go ahead and do CT scan of the  chest to evaluate his lung parenchyma as his last CT showed emphysema -We will consider pulmonary consult if CT is showing any worsening of disease  * Hypertension - continue hydrochlorothiazide - continue Lisinopril and Lopressor  * DM -Continue Lantus and sliding scale insulin  * chronic tobacco smoking  abuse/dependency - Nicotine patch daily and cessation counseling ordered  * chronic congestive heart failure  - Continue aspirin, statin therapy, lisinopril, Lopressor, hydrochlorothiazide      All the records are reviewed and case discussed with Care Management/Social Worker. Management plans discussed with the patient, nursing and they are in agreement.  CODE STATUS: Full Code  TOTAL TIME TAKING CARE OF THIS PATIENT: 45 minutes.   More than 50% of the time was spent in counseling/coordination of care: YES  POSSIBLE D/C IN 1-2 DAYS, DEPENDING ON CLINICAL CONDITION.   Kyle Rich M.D on 07/03/2017 at 12:43 PM  Between 7am to 6pm - Pager - [phone]  After 6pm go to [email or web address] - Social research officer, government  [facility]  Office  [phone]  CC: Primary care physician; Rayetta Humphrey, MD  Note: This dictation was prepared with Dragon dictation along with smaller phrase technology. Any transcriptional errors that result from this process are unintentional.

## 2017-07-03 NOTE — Progress Notes (Addendum)
Date: 07/03/2017,   MRN# [id number] Kyle Rich [place] 1955-10-30 Code Status:     Code Status Orders        Start     Ordered   07/03/17 0112  Full code  Continuous     07/03/17 0111    Code Status History    Date Active Date Inactive Code Status Order ID Comments User Context   06/15/2016 10:07 AM 06/15/2016 10:42 PM Full Code [id number]  Shaune Pollack, MD Inpatient   06/09/2015 11:59 AM 06/10/2015  3:27 PM Full Code [id number]  Houston Siren, MD Inpatient     Hosp day:@LENGTHOFSTAYDAYS @ Referring MD: @ATDPROV @      CC: copd exacerbation.   HPI: This is a 61 yo male, smoker, come in with tight wheezing, sob, no chest pain, mild sputum production. CHEST CT noted . No PE, no pneumonia, no pulmonary edema. He denies chest pain, ectopy, syncope, edema, calf pain, or bleeding. No recent exposure to noxious inhalants. See hpi for more details.  PMHX:   Past Medical History:  Diagnosis Date  . Arthritis   . Asthma   . CHF (congestive heart failure) (HCC)   . COPD (chronic obstructive pulmonary disease) (HCC)   . Diabetes mellitus without complication (HCC)   . Hepatitis C   . Hypertension    Surgical Hx:  Past Surgical History:  Procedure Laterality Date  . CARDIAC CATHETERIZATION N/A 06/15/2016   Procedure: Left Heart Cath and Coronary Angiography PCI;  Surgeon: Alwyn Pea, MD;  Location: [facility] INVASIVE CV LAB;  Service: Cardiovascular;  Laterality: N/A;   Family Hx:  Family History  Problem Relation Age of Onset  . Bladder Cancer Mother   . Lung cancer Father    Social Hx:   Social History  Substance Use Topics  . Smoking status: Current Every Day Smoker    Packs/day: 0.50    Years: 40.00    Types: Cigarettes  . Smokeless tobacco: Never Used  . Alcohol use 0.6 oz/week    1 Cans of beer per week   Medication:    Home Medication:    Current Medication: @CURMEDTAB @   Allergies:  Patient has no known allergies.  Review of Systems: Gen:  Denies  fever, sweats,  chills HEENT: Denies blurred vision, double vision, ear pain, eye pain, hearing loss, nose bleeds, sore throat Cvc:  No dizziness, chest pain or heaviness Resp: + ve wheezing, sob, coughing, chest tightness   Gi: Denies swallowing difficulty, stomach pain, nausea or vomiting, diarrhea, constipation, bowel incontinence Gu:  Denies bladder incontinence, burning urine Ext:   No Joint pain, stiffness or swelling Skin: No skin rash, easy bruising or bleeding or hives Endoc:  No polyuria, polydipsia , polyphagia or weight change Psych: No depression, insomnia or hallucinations  Other:  All other systems negative  Physical Examination:   VS: BP (!) 162/70 (BP Location: Left Arm)   Pulse 80   Temp 97.8 F (36.6 C) (Oral)   Resp (!) 28 Comment: Respiratory called for Breathing Tmt  Ht 5\' 4"  (1.626 m)   Wt 187 lb 6.4 oz (85 kg)   SpO2 93%   BMI 32.17 kg/m   General Appearance: mild distress, audible wheezing  Neuro: without focal findings, mental status, speech normal, alert and oriented, cranial nerves 2-12 intact, reflexes normal and symmetric, sensation grossly normal  NECK Supple, no stridor, jvd, short , thick neck HEENT: PERRLA, EOM intact, no ptosis, no other lesions noticed Pulmonary: wheezing through out, No rales  Cardiovascular:  Normal S1,S2.  No m/r/g.     Abdomen:Benign, Soft, non-tender, No masses, hepatosplenomegaly, No lymphadenopathy Endoc: No evident thyromegaly, no signs of acromegaly or Cushing features Skin:   warm, no rashes, no ecchymosis  Extremities: normal, no cyanosis, clubbing, no edema, warm with normal capillary refill.  Labs results:   Recent Labs     07/02/17  1958  07/03/17  0614  HGB  13.3  13.4  HCT  40.6  40.5  MCV  92.6  93.5  WBC  8.5  5.5  BUN  12   --   CREATININE  0.76  0.93  GLUCOSE  123*   --   CALCIUM  8.4*   --   ,  SPIROMETRY: FVC was 1.87 liters, 68% of predicted FEV1 was 1.09, 49% of predicted FEV1 ratio was 58 FEF 25-75%  liters per second was 27% of predicted  LUNG VOLUMES: TLC was -% of predicted RV was -% of predicted *Pt unable to achieve [place], VC is estimated, 68%  DIFFUSION CAPACITY: DLCO was 49% of predicted DLCO/VA was 95% of predicted  FLOW VOLUME LOOP: C/W WITH OBSTRUCTION  Impression Severe obstruction on spirometry DLCO is severely decreased S/p cath 2017  Ost LM to LM lesion, 75 %stenosed.  Ost LAD to Prox LAD lesion, 70 %stenosed.  Mid LAD lesion, 90 %stenosed.  Ost Cx lesion, 70 %stenosed.  Dist Cx lesion, 50 %stenosed.  Prox RCA to Dist RCA lesion, 35 %stenosed.  RPDA lesion, 100 %stenosed.  There is moderate to severe left ventricular systolic dysfunction.  LV end diastolic pressure is normal.  The left ventricular ejection fraction is 25-35% by visual estimate.  There is trivial (1+) mitral regurgitation.   Conclusion Multivessel coronary disease including left main and proximal LAD with moderate to severely depressed left ventricular function EF about 35% We will arrange transfer to tertiary care center for possible coronary bypass versus complex intervention Rad results:   Ct Angio Chest Pe W Or Wo Contrast  Result Date: 07/03/2017 CLINICAL DATA:  Shortness of breath and chest pain. EXAM: CT ANGIOGRAPHY CHEST WITH CONTRAST TECHNIQUE: Multidetector CT imaging of the chest was performed using the standard protocol during bolus administration of intravenous contrast. Multiplanar CT image reconstructions and MIPs were obtained to evaluate the vascular anatomy. CONTRAST:  75 mL Isovue 370 IV COMPARISON:  06/15/2016 FINDINGS: Cardiovascular: Satisfactory opacification of the pulmonary arteries to the segmental level. No evidence of pulmonary embolism. Normal heart size. No pericardial effusion. Extensive calcified plaque again noted throughout the coronary arterial tree. Density in the LAD may correspond to a stent. There are calcifications associated with the aortic  valve. The thoracic aorta is of normal caliber. Mediastinum/Nodes: No enlarged mediastinal, hilar, or axillary lymph nodes. Thyroid gland, trachea, and esophagus demonstrate no significant findings. Lungs/Pleura: Stable emphysematous disease and scarring in the inferior lingula. There is no evidence of pulmonary edema, consolidation, pneumothorax, nodule or pleural fluid. Upper Abdomen: No acute abnormality. Musculoskeletal: No chest wall abnormality. No acute or significant osseous findings. Review of the MIP images confirms the above findings. IMPRESSION: 1. No evidence of pulmonary embolism. 2. Coronary artery disease with calcified plaque as well as potential coronary stent. 3. Emphysematous lung disease without acute pulmonary process. Emphysema (ICD10-J43.9). Electronically Signed   By: Irish Lack M.D.   On: 07/03/2017 14:28   Dg Chest Port 1 View  Result Date: 07/02/2017 CLINICAL DATA:  Shortness of breath EXAM: PORTABLE CHEST 1 VIEW COMPARISON:  06/05/2016,  CT 06/15/2016  FINDINGS: Mild chronic interstitial changes. No focal consolidation or effusion. Stable cardiomediastinal silhouette with atherosclerosis. No pneumothorax. IMPRESSION: Chronic interstitial opacity.  No acute consolidation or edema. Electronically Signed   By: Jasmine PangKim  Fujinaga M.D.   On: 07/02/2017 20:50     Assessment and Plan: Impression: 1. COPD, Here with copd exacerbation and anxiety Stay on budenoside 0.5 bid via neb, duo neb  Solumedrol 60 mg iv q 6 hours zithromax 500 mg q day DVT prophylaxis Following closely  2. Hx of cad, decrease ef 25-30 %. Presently not in florid chf -watch salt -diuresis -will have Dr. Nydia Boutonalwood to see in the am  3. Hx of sleep apnea, intolerant to cpap, sleep on 0xygen Weight loss Encourage to go back on cpap on d/c        I have personally obtained a history, examined the patient, evaluated laboratory and imaging results, formulated the assessment and plan and placed  orders.  The Patient requires high complexity decision making for assessment and support, frequent evaluation and titration of therapies, application of advanced monitoring technologies and extensive interpretation of multiple databases.   Herbon Fleming,M.D. Pulmonary & Critical care Medicine [facility]

## 2017-07-03 NOTE — Progress Notes (Signed)
Rapid Response RN called to come assess patient who had been having extreme dyspnea and diaphoretic but not hypoxia even with mild exertion (most recent event just scooting to side of bed to use urinal). At bedside were 2C charge RN, patient's RNs, and [facility]. Patient does have history of COPD and asthma. Dr. Sherryll BurgerShah had just seen patient and ordered ABG and pulmonology consult. Chest CT showed no pulmonary embolus.   Patient alert and oriented, NSR, with 100% O2 saturation on 5L of O2. Bilateral wheezes in all lung fields, audible even without stethoscope. Patient already has ordered schedule duo-neb breathing treatments, prn duo-neb breathing treatments, and scheduled IV steroids.   Peyton NajjarLarry RT also at bedside after this RN finished assessment and assessed patient and drew ABG. Peyton NajjarLarry RT familiar with patient from previous admission and stated that before he would be very short of breath and wheezy but that with steroids and breathing treatments would improve with time. Team brainstormed (discussed adding anti-anxiety medication, changing breathing treatment frequency or type) but ultimately awaiting pulmonology input. Patient stated he hated the BIPAP and would not wear it even if the doctor felt he should based on his ABG results. Team obtained ordered for telemetry and continuous pulse ox to monitor patient. Patient somewhat reassured by this increase in monitoring. Currently Rapid Response RN recommended limited strenuous activity. Informed patient's RNs and patient to recall rapid response if patient deteriorates or further help needed.

## 2017-07-03 NOTE — Evaluation (Signed)
Physical Therapy Evaluation Patient Details Name: Kyle Rich MRN: [id number] DOB: 30-Oct-1955 Today's Date: 07/03/2017   History of Present Illness  presented to ER secondary to progressive SOB; admitted for management of COPD exacerbation  Clinical Impression  Upon evaluation, patient alert and oriented; slightly anxious due to moderate SOB at rest.  Requested transfer to chair for improved positioning and breath support.  Demonstrates ability to complete bed mobility, sit/stand and basic transfers without assist device, close sup.  Slightly impulsive, requiring UE support to maintain balance with simple transfers.  Unable to tolerate additional distance/activity due to worsening SOB; however, sats maintained >93% throughout on 2L O2 via [place].  Nursing called to room and at bedside for assessment end of session. Will continue mobility assessment, progression as patient medically appropriate in subsequent sessions. Would benefit from skilled PT to address above deficits and promote optimal return to PLOF; Recommend transition to HHPT upon discharge from acute hospitalization.  May also benefit from referral to outpatient pulmonary rehab program when appropriate.     Follow Up Recommendations Home health PT (may benefit from outpatient pulmonary rehab program)    Equipment Recommendations       Recommendations for Other Services       Precautions / Restrictions Precautions Precautions: Fall Restrictions Weight Bearing Restrictions: No      Mobility  Bed Mobility Overal bed mobility: Modified Independent                Transfers Overall transfer level: Needs assistance Equipment used: None Transfers: Sit to/from Stand Sit to Stand: Supervision            Ambulation/Gait             General Gait Details: unsafe/unable to tolerate due to significant SOB with minimal exertion  Stairs            Wheelchair Mobility    Modified Rankin (Stroke Patients  Only)       Balance Overall balance assessment: Needs assistance Sitting-balance support: No upper extremity supported;Feet supported Sitting balance-Leahy Scale: Good     Standing balance support: No upper extremity supported Standing balance-Leahy Scale: Fair                               Pertinent Vitals/Pain Pain Assessment: No/denies pain    Home Living Family/patient expects to be discharged to:: Private residence Living Arrangements: Children Available Help at Discharge: Family;Available 24 hours/day Type of Home: House Home Access: Stairs to enter Entrance Stairs-Rails: None Entrance Stairs-Number of Steps: 2 Home Layout: Two level;Able to live on main level with bedroom/bathroom        Prior Function Level of Independence: Independent         Comments: Indep for ADLs, household and limited community mobility without assist device; home O2 at 2L; denies fall history.     Hand Dominance        Extremity/Trunk Assessment   Upper Extremity Assessment Upper Extremity Assessment: Overall WFL for tasks assessed    Lower Extremity Assessment Lower Extremity Assessment: Overall WFL for tasks assessed (grossly at least 4/5 throughout)       Communication   Communication: No difficulties  Cognition Arousal/Alertness: Awake/alert Behavior During Therapy: WFL for tasks assessed/performed;Anxious Overall Cognitive Status: Within Functional Limits for tasks assessed  General Comments      Exercises     Assessment/Plan    PT Assessment Patient needs continued PT services  PT Problem List Decreased activity tolerance;Decreased balance;Decreased mobility;Cardiopulmonary status limiting activity       PT Treatment Interventions DME instruction;Gait training;Stair training;Functional mobility training;Therapeutic activities;Therapeutic exercise;Balance training;Patient/family education     PT Goals (Current goals can be found in the Care Plan section)  Acute Rehab PT Goals Patient Stated Goal: to return home PT Goal Formulation: With patient Time For Goal Achievement: 07/17/17 Potential to Achieve Goals: Good    Frequency Min 1X/week   Barriers to discharge        Co-evaluation               AM-PAC PT "6 Clicks" Daily Activity  Outcome Measure Difficulty turning over in bed (including adjusting bedclothes, sheets and blankets)?: None Difficulty moving from lying on back to sitting on the side of the bed? : None Difficulty sitting down on and standing up from a chair with arms (e.g., wheelchair, bedside commode, etc,.)?: A Lot Help needed moving to and from a bed to chair (including a wheelchair)?: A Little Help needed walking in hospital room?: A Little Help needed climbing 3-5 steps with a railing? : A Little 6 Click Score: 19    End of Session Equipment Utilized During Treatment: Oxygen Activity Tolerance: Patient tolerated treatment well Patient left: in chair;with call bell/phone within reach;with chair alarm set;with nursing/sitter in room Nurse Communication: Mobility status PT Visit Diagnosis: Muscle weakness (generalized) (M62.81);Difficulty in walking, not elsewhere classified (R26.2)    Time: 6962-9528 PT Time Calculation (min) (ACUTE ONLY): 13 min   Charges:   PT Evaluation $PT Eval Low Complexity: 1 Low     PT G Codes:   PT G-Codes **NOT FOR INPATIENT CLASS** Functional Assessment Tool Used: AM-PAC 6 Clicks Basic Mobility Functional Limitation: Mobility: Walking and moving around Mobility: Walking and Moving Around Current Status (U1324): At least 20 percent but less than 40 percent impaired, limited or restricted Mobility: Walking and Moving Around Goal Status [phone]): At least 1 percent but less than 20 percent impaired, limited or restricted   [place] Rosamond H. Manson Passey, PT, DPT, NCS 07/03/17, 11:32 AM [phone]

## 2017-07-04 LAB — BASIC METABOLIC PANEL
Anion gap: 5 (ref 5–15)
BUN: 32 mg/dL — ABNORMAL HIGH (ref 6–20)
CALCIUM: 8.5 mg/dL — AB (ref 8.9–10.3)
CHLORIDE: 103 mmol/L (ref 101–111)
CO2: 28 mmol/L (ref 22–32)
CREATININE: 0.93 mg/dL (ref 0.61–1.24)
GFR calc non Af Amer: >60 mL/min (ref >60)
GLUCOSE: 145 mg/dL — AB (ref 65–99)
Potassium: 4.5 mmol/L (ref 3.5–5.1)
Sodium: 136 mmol/L (ref 135–145)

## 2017-07-04 LAB — GLUCOSE, CAPILLARY
GLUCOSE-CAPILLARY: 135 mg/dL — AB (ref 65–99)
GLUCOSE-CAPILLARY: 131 mg/dL — AB (ref 65–99)
Glucose-Capillary: 128 mg/dL — ABNORMAL HIGH (ref 65–99)
Glucose-Capillary: 130 mg/dL — ABNORMAL HIGH (ref 65–99)

## 2017-07-04 LAB — CBC
HCT: 41.9 % (ref 40.0–52.0)
HEMOGLOBIN: 13.6 g/dL (ref 13.0–18.0)
MCH: 30.2 pg (ref 26.0–34.0)
MCHC: 32.4 g/dL (ref 32.0–36.0)
MCV: 93.2 fL (ref 80.0–100.0)
Platelets: 302 10*3/uL (ref 150–440)
RBC: 4.5 MIL/uL (ref 4.40–5.90)
RDW: 14.7 % — AB (ref 11.5–14.5)
WBC: 17.9 10*3/uL — ABNORMAL HIGH (ref 3.8–10.6)

## 2017-07-04 MED ORDER — ALUM & MAG HYDROXIDE-SIMETH 200-200-20 MG/5ML PO SUSP
30.0000 mL | Freq: Four times a day (QID) | ORAL | Status: DC | PRN
  Administered 2017-07-05: 30 mL via ORAL
  Filled 2017-07-04: qty 30

## 2017-07-04 MED ORDER — DEXTROSE 5 % IV SOLN
1.0000 g | INTRAVENOUS | Status: DC
  Administered 2017-07-04 – 2017-07-07 (×4): 1 g via INTRAVENOUS
  Filled 2017-07-04 (×5): qty 10

## 2017-07-04 NOTE — Progress Notes (Signed)
Date: 07/04/2017,   MRN# [id number] Kyle Rich March 10, 1956 Code Status:     Code Status Orders        Start     Ordered   07/03/17 0112  Full code  Continuous     07/03/17 0111    Code Status History    Date Active Date Inactive Code Status Order ID Comments User Context   06/15/2016 10:07 AM 06/15/2016 10:42 PM Full Code [id number]  Shaune Pollackhen, Qing, MD Inpatient   06/09/2015 11:59 AM 06/10/2015  3:27 PM Full Code [id number]  [place] SirenSainani, Vivek J, MD Inpatient     Hosp day:@LENGTHOFSTAYDAYS @ Referring MD: @ATDPROV @      HPI: still wheezing, sob is some better, no pleurisy. No calf pain. No edema. Answered all of his questions. Discussed with his attending.   PMHX:   Past Medical History:  Diagnosis Date  . Arthritis   . Asthma   . CHF (congestive heart failure) (HCC)   . COPD (chronic obstructive pulmonary disease) (HCC)   . Diabetes mellitus without complication (HCC)   . Hepatitis C   . Hypertension    Surgical Hx:  Past Surgical History:  Procedure Laterality Date  . CARDIAC CATHETERIZATION N/A 06/15/2016   Procedure: Left Heart Cath and Coronary Angiography PCI;  Surgeon: Alwyn Peawayne D Callwood, MD;  Location: [facility] INVASIVE CV LAB;  Service: Cardiovascular;  Laterality: N/A;   Family Hx:  Family History  Problem Relation Age of Onset  . Bladder Cancer Mother   . Lung cancer Father    Social Hx:   Social History  Substance Use Topics  . Smoking status: Current Every Day Smoker    Packs/day: 0.50    Years: 40.00    Types: Cigarettes  . Smokeless tobacco: Never Used  . Alcohol use 0.6 oz/week    1 Cans of beer per week   Medication:    Home Medication:    Current Medication: @CURMEDTAB @   Allergies:  Patient has no known allergies.  Review of Systems: Gen:  Denies  fever, sweats, chills HEENT: Denies blurred vision, double vision, ear pain, eye pain, hearing loss, nose bleeds, sore throat Cvc:  No dizziness, chest pain or heaviness Resp: still wheezing, no cough.  No hemoptysis   Gi: Denies swallowing difficulty, stomach pain, nausea or vomiting, diarrhea, constipation, bowel incontinence Gu:  Denies bladder incontinence, burning urine Ext:   No Joint pain, stiffness or swelling Skin: No skin rash, easy bruising or bleeding or hives Endoc:  No polyuria, polydipsia , polyphagia or weight change Psych: No depression, insomnia or hallucinations  Other:  All other systems negative  Physical Examination:   VS: BP 130/65 (BP Location: Left Arm)   Pulse 83   Temp 97.9 F (36.6 C)   Resp 20   Ht 5\' 4"  (1.626 m)   Wt 187 lb 6.4 oz (85 kg)   SpO2 93%   BMI 32.17 kg/m   General Appearance: mild distress  Neuro: without focal findings, mental status, speech normal, alert and oriented, cranial nerves 2-12 intact, reflexes normal and symmetric, sensation grossly normal  HEENT: PERRLA, EOM intact, no ptosis, no other lesions noticed NECK: Supple, no stridor, thick neck Pulmonary:.Still wheezing, mild accessory muscle usage   Cardiovascular:  Normal S1,S2.  No m/r/g.     Abdomen:Benign, Soft, non-tender, No masses, hepatosplenomegaly, No lymphadenopathy Endoc: No evident thyromegaly, no signs of acromegaly or Cushing features Skin:   warm, no rashes, no ecchymosis  Extremities: normal, no cyanosis, clubbing, no edema,  warm with normal capillary refill.   Labs results:   Recent Labs     07/02/17  1958  07/03/17  0614  07/04/17  0448  HGB  13.3  13.4  13.6  HCT  40.6  40.5  41.9  MCV  92.6  93.5  93.2  WBC  8.5  5.5  17.9*  BUN  12   --   32*  CREATININE  0.76  0.93  0.93  GLUCOSE  123*   --   145*  CALCIUM  8.4*   --   8.5*  ,      Assessment and Plan: 1. COPD, Here with copd exacerbation, still wheezing, though some better, + ve anxiety Continue budenoside 0.5 bid via neb, duo neb  Solumedrol 60 mg iv q 6 hours zithromax 500 mg q day DVT prophylaxis Following closely  2. Hx of cad, decrease ef 25-30 %. Presently not in florid  chf -watch salt -diuresis -will have Dr. Nydia Bouton to see   3. Hx of sleep apnea, intolerant to cpap, sleep on 0xygen Weight loss Encourage to go back on cpap on d/c    I have personally obtained a history, examined the patient, evaluated laboratory and imaging results, formulated the assessment and plan and placed orders.  The Patient requires high complexity decision making for assessment and support, frequent evaluation and titration of therapies, application of advanced monitoring technologies and extensive interpretation of multiple databases.   Shaunika Italiano,M.D. Pulmonary & Critical care Medicine [facility]

## 2017-07-04 NOTE — Progress Notes (Signed)
1        [employer] [facility] [place] at [facility]   PATIENT NAME: Kyle Rich    MR#:  [id number]  DATE OF BIRTH:  Jul 15, 1956  SUBJECTIVE:  CHIEF COMPLAINT:   Chief Complaint  Patient presents with  . Shortness of Breath  Not feeling well still complains of shortness of breath and wheezing but now also having chest tightness, feels like he is going to die, his oxygen requirements are up to 6 L now REVIEW OF SYSTEMS:  Review of Systems  Constitutional: Negative for chills, fever and weight loss.  HENT: Negative for nosebleeds and sore throat.   Eyes: Negative for blurred vision.  Respiratory: Positive for shortness of breath. Negative for cough and wheezing.   Cardiovascular: Negative for chest pain, orthopnea, leg swelling and PND.  Gastrointestinal: Negative for abdominal pain, constipation, diarrhea, heartburn, nausea and vomiting.  Genitourinary: Negative for dysuria and urgency.  Musculoskeletal: Negative for back pain.  Skin: Negative for rash.  Neurological: Negative for dizziness, speech change, focal weakness and headaches.  Endo/Heme/Allergies: Does not bruise/bleed easily.  Psychiatric/Behavioral: Negative for depression.   DRUG ALLERGIES:  No Known Allergies VITALS:  Blood pressure 130/65, pulse 83, temperature 97.9 F (36.6 C), resp. rate 20, height 5\' 4"  (1.626 m), weight 85 kg (187 lb 6.4 oz), SpO2 93 %. PHYSICAL EXAMINATION:  Physical Exam  Constitutional: He is oriented to person, place, and time and well-developed, well-nourished, and in no distress.  HENT:  Head: Normocephalic and atraumatic.  Eyes: Pupils are equal, round, and reactive to light. Conjunctivae and EOM are normal.  Neck: Normal range of motion. Neck supple. No tracheal deviation present. No thyromegaly present.  Cardiovascular: Normal rate, regular rhythm and normal heart sounds.   Pulmonary/Chest: He is in respiratory distress. He has wheezes. He exhibits no tenderness.    Abdominal: Soft. Bowel sounds are normal. He exhibits no distension. There is no tenderness.  Musculoskeletal: Normal range of motion.  Neurological: He is alert and oriented to person, place, and time. No cranial nerve deficit.  Skin: Skin is warm and dry. No rash noted.  Psychiatric: Mood and affect normal.   LABORATORY PANEL:  Male CBC  Recent Labs Lab 07/04/17 0448  WBC 17.9*  HGB 13.6  HCT 41.9  PLT 302   ------------------------------------------------------------------------------------------------------------------ Chemistries   Recent Labs Lab 07/02/17 1958  07/04/17 0448  NA 136  --  136  K 4.0  --  4.5  CL 101  --  103  CO2 25  --  28  GLUCOSE 123*  --  145*  BUN 12  --  32*  CREATININE 0.76  < > 0.93  CALCIUM 8.4*  --  8.5*  AST 20  --   --   ALT 16*  --   --   ALKPHOS 62  --   --   BILITOT 0.6  --   --   < > = values in this interval not displayed. RADIOLOGY:  No results found. ASSESSMENT AND PLAN:  61 y.o. male with a known history of COPD, chronic respiratory failure on 2 L at home being admitted for acute on chronic COPD exacerbation  * Acute on chronic respiratory failure due to COPD exacerbation - continue empiric IV Solu-Medrol with tapering as tolerated, aggressive pulmonary toilet with bronchodilator therapy - CT scan of the chest shows emphysema, may be early pneumonia -Appreciate pulmonary consultation, will add IV Rocephin to cover for possible pneumonia he is already on Zithromax -  Consult cardiology  * Hypertension - continue hydrochlorothiazide - continue Lisinopril and Lopressor  * DM -Continue Lantus and sliding scale insulin  * chronic tobacco smoking abuse/dependency - Nicotine patch daily and cessation counseling ordered  * chronic congestive heart failure  - Continue aspirin, statin therapy, lisinopril, Lopressor, hydrochlorothiazide      All the records are reviewed and case discussed with Care Management/Social  Worker. Management plans discussed with the patient, nursing and they are in agreement.  CODE STATUS: Full Code  TOTAL TIME TAKING CARE OF THIS PATIENT: 45 minutes.   More than 50% of the time was spent in counseling/coordination of care: YES  POSSIBLE D/C IN 1-2 DAYS, DEPENDING ON CLINICAL CONDITION.   Delfino LovettVipul Axiel Fjeld M.D on 07/04/2062 at 4:49 PM  Between 7am to 6pm - Pager - [phone]  After 6pm go to [email or web address] - Social research officer, governmentpassword EPAS [facility]  [facility] [facility]  Office  [phone]  CC: Primary care physician; [name], Sionne A, MD  Note: This dictation was prepared with Dragon dictation along with smaller phrase technology. Any transcriptional errors that result from this process are unintentional.

## 2017-07-05 LAB — BASIC METABOLIC PANEL
ANION GAP: 9 (ref 5–15)
BUN: 49 mg/dL — ABNORMAL HIGH (ref 6–20)
CO2: 27 mmol/L (ref 22–32)
Calcium: 8.5 mg/dL — ABNORMAL LOW (ref 8.9–10.3)
Chloride: 101 mmol/L (ref 101–111)
Creatinine, Ser: 1 mg/dL (ref 0.61–1.24)
Glucose, Bld: 119 mg/dL — ABNORMAL HIGH (ref 65–99)
POTASSIUM: 4.3 mmol/L (ref 3.5–5.1)
Sodium: 137 mmol/L (ref 135–145)

## 2017-07-05 LAB — CBC
HEMATOCRIT: 42.4 % (ref 40.0–52.0)
HEMOGLOBIN: 13.8 g/dL (ref 13.0–18.0)
MCH: 30.4 pg (ref 26.0–34.0)
MCHC: 32.4 g/dL (ref 32.0–36.0)
MCV: 93.7 fL (ref 80.0–100.0)
Platelets: 324 10*3/uL (ref 150–440)
RBC: 4.53 MIL/uL (ref 4.40–5.90)
RDW: 14.4 % (ref 11.5–14.5)
WBC: 19.9 10*3/uL — AB (ref 3.8–10.6)

## 2017-07-05 LAB — GLUCOSE, CAPILLARY
GLUCOSE-CAPILLARY: 180 mg/dL — AB (ref 65–99)
Glucose-Capillary: 126 mg/dL — ABNORMAL HIGH (ref 65–99)
Glucose-Capillary: 96 mg/dL (ref 65–99)

## 2017-07-05 MED ORDER — METHYLPREDNISOLONE SODIUM SUCC 125 MG IJ SOLR
60.0000 mg | Freq: Three times a day (TID) | INTRAMUSCULAR | Status: DC
  Administered 2017-07-05 – 2017-07-07 (×6): 60 mg via INTRAVENOUS
  Filled 2017-07-05 (×7): qty 2

## 2017-07-05 MED ORDER — MONTELUKAST SODIUM 10 MG PO TABS
10.0000 mg | ORAL_TABLET | Freq: Every day | ORAL | Status: DC
  Administered 2017-07-05 – 2017-07-07 (×3): 10 mg via ORAL
  Filled 2017-07-05 (×3): qty 1

## 2017-07-05 MED ORDER — ALPRAZOLAM 0.5 MG PO TABS
0.5000 mg | ORAL_TABLET | Freq: Three times a day (TID) | ORAL | Status: DC | PRN
  Administered 2017-07-05 – 2017-07-08 (×6): 0.5 mg via ORAL
  Filled 2017-07-05 (×6): qty 1

## 2017-07-05 NOTE — Progress Notes (Signed)
1        [employer] - [place] [facility]   PATIENT NAME: Kyle Rich    MR#:  [id number]  DATE OF BIRTH:  November 30, 1955  SUBJECTIVE:  CHIEF COMPLAINT:   Chief Complaint  Patient presents with  . Shortness of Breath  Anxious, feels he is going to have a heart attack REVIEW OF SYSTEMS:  Review of Systems  Constitutional: Negative for chills, fever and weight loss.  HENT: Negative for nosebleeds and sore throat.   Eyes: Negative for blurred vision.  Respiratory: Positive for shortness of breath. Negative for cough and wheezing.   Cardiovascular: Negative for chest pain, orthopnea, leg swelling and PND.  Gastrointestinal: Negative for abdominal pain, constipation, diarrhea, heartburn, nausea and vomiting.  Genitourinary: Negative for dysuria and urgency.  Musculoskeletal: Negative for back pain.  Skin: Negative for rash.  Neurological: Negative for dizziness, speech change, focal weakness and headaches.  Endo/Heme/Allergies: Does not bruise/bleed easily.  Psychiatric/Behavioral: Negative for depression.   DRUG ALLERGIES:  No Known Allergies VITALS:  Blood pressure 111/64, pulse 76, temperature 97.7 F (36.5 C), temperature source Oral, resp. rate (!) 22, height 5\' 4"  (1.626 m), weight 85 kg (187 lb 6.4 oz), SpO2 98 %. PHYSICAL EXAMINATION:  Physical Exam  Constitutional: He is oriented to person, place, and time and well-developed, well-nourished, and in no distress.  HENT:  Head: Normocephalic and atraumatic.  Eyes: Pupils are equal, round, and reactive to light. Conjunctivae and EOM are normal.  Neck: Normal range of motion. Neck supple. No tracheal deviation present. No thyromegaly present.  Cardiovascular: Normal rate, regular rhythm and normal heart sounds.   Pulmonary/Chest: He is in respiratory distress. He has wheezes. He exhibits no tenderness.  Abdominal: Soft. Bowel sounds are normal. He exhibits no distension. There is no tenderness.    Musculoskeletal: Normal range of motion.  Neurological: He is alert and oriented to person, place, and time. No cranial nerve deficit.  Skin: Skin is warm and dry. No rash noted.  Psychiatric: Mood and affect normal.   LABORATORY PANEL:  Male CBC  Recent Labs Lab 07/05/17 0538  WBC 19.9*  HGB 13.8  HCT 42.4  PLT 324   ------------------------------------------------------------------------------------------------------------------ Chemistries   Recent Labs Lab 07/02/17 1958  07/05/17 0538  NA 136  < > 137  K 4.0  < > 4.3  CL 101  < > 101  CO2 25  < > 27  GLUCOSE 123*  < > 119*  BUN 12  < > 49*  CREATININE 0.76  < > 1.00  CALCIUM 8.4*  < > 8.5*  AST 20  --   --   ALT 16*  --   --   ALKPHOS 62  --   --   BILITOT 0.6  --   --   < > = values in this interval not displayed. RADIOLOGY:  No results found. ASSESSMENT AND PLAN:  61 y.o. male with a known history of COPD, chronic respiratory failure on 2 L at home being admitted for acute on chronic COPD exacerbation  * Acute on chronic respiratory failure due to COPD exacerbation - continue empiric IV Solu-Medrol with tapering as tolerated, aggressive pulmonary toilet with bronchodilator therapy - CT scan of the chest shows emphysema, may be early pneumonia -Appreciate pulmonary consultation, continue Rocephin and Zithromax -Appreciate cardiology input  * Hypertension - continue hydrochlorothiazide - continue Lisinopril and Lopressor  * DM -Continue Lantus and sliding scale insulin  * chronic tobacco smoking abuse/dependency -  Nicotine patch daily and cessation counseling ordered  * chronic congestive heart failure  - Continue aspirin, statin therapy, lisinopril, Lopressor, hydrochlorothiazide  *Anxiety -Xanax as needed      All the records are reviewed and case discussed with Care Management/Social Worker. Management plans discussed with the patient, nursing and they are in agreement.  CODE STATUS:  Full Code  TOTAL TIME TAKING CARE OF THIS PATIENT: 25 minutes.   More than 50% of the time was spent in counseling/coordination of care: YES  POSSIBLE D/C IN 1-2 DAYS, DEPENDING ON CLINICAL CONDITION.   Delfino Lovett M.D on 07/05/2017 at 4:06 PM  Between 7am to 6pm - Pager - [phone]  After 6pm go to [email or web address] - Social research officer, government  [facility] [place] [facility]  Office  [phone]  CC: Primary care physician; Rayetta Humphrey, MD  Note: This dictation was prepared with Dragon dictation along with smaller phrase technology. Any transcriptional errors that result from this process are unintentional.

## 2017-07-05 NOTE — Progress Notes (Signed)
Date: 07/05/2017,   MRN# [id number] Kyle Rich 12-23-1955 Code Status:     Code Status Orders        Start     Ordered   07/03/17 0112  Full code  Continuous     07/03/17 0111    Code Status History    Date Active Date Inactive Code Status Order ID Comments User Context   06/15/2016 10:07 AM 06/15/2016 10:42 PM Full Code [id number]  Shaune Pollackhen, Qing, MD Inpatient   06/09/2015 11:59 AM 06/10/2015  3:27 PM Full Code [id number]  Houston SirenSainani, Vivek J, MD Inpatient     Hosp day:@LENGTHOFSTAYDAYS @ Referring MD: @ATDPROV @      HPI: still wheezing, anxious, concern about his heart. Cardiology has seen. No further w/u planned/   PMHX:   Past Medical History:  Diagnosis Date  . Arthritis   . Asthma   . CHF (congestive heart failure) (HCC)   . COPD (chronic obstructive pulmonary disease) (HCC)   . Diabetes mellitus without complication (HCC)   . Hepatitis C   . Hypertension    Surgical Hx:  Past Surgical History:  Procedure Laterality Date  . CARDIAC CATHETERIZATION N/A 06/15/2016   Procedure: Left Heart Cath and Coronary Angiography PCI;  Surgeon: Alwyn Peawayne D Callwood, MD;  Location: [facility] INVASIVE CV LAB;  Service: Cardiovascular;  Laterality: N/A;   Family Hx:  Family History  Problem Relation Age of Onset  . Bladder Cancer Mother   . Lung cancer Father    Social Hx:   Social History  Substance Use Topics  . Smoking status: Current Every Day Smoker    Packs/day: 0.50    Years: 40.00    Types: Cigarettes  . Smokeless tobacco: Never Used  . Alcohol use 0.6 oz/week    1 Cans of beer per week   Medication:    Home Medication:    Current Medication: @CURMEDTAB @   Allergies:  Patient has no known allergies.  Review of Systems: Gen:  Denies  fever, sweats, chills HEENT: Denies blurred vision, double vision, ear pain, eye pain, hearing loss, nose bleeds, sore throat Cvc:  No dizziness, chest pain or heaviness Resp: still wheezing   Gi: Denies swallowing difficulty, stomach  pain, nausea or vomiting, diarrhea, constipation, bowel incontinence Gu:  Denies bladder incontinence, burning urine Ext:   No Joint pain, stiffness or swelling Skin: No skin rash, easy bruising or bleeding or hives Endoc:  No polyuria, polydipsia , polyphagia or weight change Psych: No depression, insomnia or hallucinations  Other:  All other systems negative  Physical Examination:   VS: BP 111/64 (BP Location: Left Arm)   Pulse 76   Temp 97.7 F (36.5 C) (Oral)   Resp (!) 22   Ht 5\' 4"  (1.626 m)   Wt 187 lb 6.4 oz (85 kg)   SpO2 98%   BMI 32.17 kg/m   General Appearance: No distress  Neuro: without focal findings, mental status, speech normal, alert and oriented, cranial nerves 2-12 intact, reflexes normal and symmetric, sensation grossly normal  HEENT: PERRLA, EOM intact, no ptosis, no other lesions noticed, Mallampati: Pulmonary:.No wheezing, No rales  Sputum Production:   Cardiovascular:  Normal S1,S2.  No m/r/g.  Abdominal aorta pulsation normal.    Abdomen:Benign, Soft, non-tender, No masses, hepatosplenomegaly, No lymphadenopathy Endoc: No evident thyromegaly, no signs of acromegaly or Cushing features Skin:   warm, no rashes, no ecchymosis  Extremities: normal, no cyanosis, clubbing, no edema, warm with normal capillary refill. Other findings:   Labs  results:   Recent Labs     07/02/17  1958  07/03/17  0614  07/04/17  0448  07/05/17  0538  HGB  13.3  13.4  13.6  13.8  HCT  40.6  40.5  41.9  42.4  MCV  92.6  93.5  93.2  93.7  WBC  8.5  5.5  17.9*  19.9*  BUN  12   --   32*  49*  CREATININE  0.76  0.93  0.93  1.00  GLUCOSE  123*   --   145*  119*  CALCIUM  8.4*   --   8.5*  8.5*  ,    Assessment and Plan: 1. COPD, Here with copd exacerbation, persistent wheezing, + ve anxiety Continue budenoside 0.5 bid via neb, duo neb  Solumedrol 60 mg iv q 6 hours. In am transition to po steroids Zithromax/rocephin Add singualir 10 mg q hs  DVT prophylaxis Following  closely  2. Hx of cad, decrease ef 25-30 %. Presently not in florid chf -watch salt -diuresis -will have Dr. Nydia Bouton to see   3. Hx of sleep apnea, intolerant to cpap, sleep on 0xygen Weight loss Encourage to go back on cpap on d/c   I have personally obtained a history, examined the patient, evaluated laboratory and imaging results, formulated the assessment and plan and placed orders.  The Patient requires high complexity decision making for assessment and support, frequent evaluation and titration of therapies, application of advanced monitoring technologies and extensive interpretation of multiple databases.   Dariyon Urquilla,M.D. Pulmonary & Critical care Medicine [facility]

## 2017-07-05 NOTE — Progress Notes (Signed)
PT Cancellation Note  Patient Details Name: Kyle Rich MRN: [id number] DOB: Apr 27, 1956   Cancelled Treatment:    Reason Eval/Treat Not Completed: Patient declined, no reason specified.  Pt reports that he just "can't do it right now" (in terms of breathing) and that when he feels he can get OOB and do something, he will know it and will do it at that time.  Pt reports he has been walking to bathroom but usually uses a walker at home and does not have one in his hospital room (therapist brought RW to pt's room and adjusted it to pt's reported height).  Pt reports plan to discharge home from hospital.  Will re-attempt PT treatment at a later date/time.  Hendricks LimesEmily Qadir Folks, PT 07/05/17, 11:28 AM [phone]

## 2017-07-05 NOTE — Care Management (Signed)
Patient admitted with COPD.  Patient lives at home with daughter.  Patient states that he does not have his own transportation and has to rely on his pastor for transportation .  Kyle Rich will provide transportation at discharge. PCP Greggory StallionGeorge. PT has assessed patient and recommends home health PT.  Patient would also benefit from home health RN.  Patient will require RW at discharge.  Patient has chronic O2 though [employer]. Patient agreeable for home health services.  Does not have preference of agency.  RNCM has reached out agencies to see who is aceepting Medicaid patients for RN and PT.  Patient expresses concerns that his gas in his home has been turned off.  Patient states that his 63 year old daughter and 63 year old grandson live in the home. Patient states that he needs to "get down to the office to get a voucher to pay for his bill, that's what they did last time".  Patient states that his daughter does not have transportation to "go down to the office".  RNCM asked if the pastor could provide transportation for his daughter.  Patient states "yes, but she won't take care of that kinda of stuff unless I am there." Patient was provided with information on link transport bus for his daughter.  Provided with number to [employer].  Per their website customers are to reach out to department of social services.  Their number was also provided to the patient.   After following up with the patient he states that he hasn't yet called [employer]

## 2017-07-05 NOTE — Consult Note (Signed)
[facility]  Patient ID: Kyle Rich, MRN: [id number], DOB/AGE: 1956/04/08 61 y.o. Admit date: 07/02/2017   Date of Consult: 07/05/2017 Primary Physician: Rayetta Rich, Kyle A, MD [facility]  Chief Complaint:  Chief Complaint  Patient presents with  . Shortness of Breath   Reason for Consult: chest pain  HPI: 61 y.o. male with known coronary artery disease status post recent significant unstable angina and non-ST elevation myocardial infarction for which patient underwent PCI and stent placement of left anterior descending artery and right coronary. The patient was placed on appropriate medication management and discharged home. There is been no evidence of significant progression of issues since that time and the patient has not necessarily cyst abstain from smoking. In the last several weeks the patient has had a cough and congestion and is significantly increased with cough and wheezing. Hypoxia has been an issue with this hospitalization. The patient has had some chest pain with this cough but this is not consistent with his discomfort and other symptoms that he had prior to his PCI and stent placement. Currently he does have some respiratory distress.  Past Medical History:  Diagnosis Date  . Arthritis   . Asthma   . CHF (congestive heart failure) (HCC)   . COPD (chronic obstructive pulmonary disease) (HCC)   . Diabetes mellitus without complication (HCC)   . Hepatitis C   . Hypertension       Surgical History:  Past Surgical History:  Procedure Laterality Date  . CARDIAC CATHETERIZATION N/A 06/15/2016   Procedure: Left Heart Cath and Coronary Angiography PCI;  Surgeon: Alwyn Peawayne D Callwood, MD;  Location: [facility] INVASIVE CV LAB;  Service: Cardiovascular;  Laterality: N/A;     Home Meds: Prior to Admission medications   Medication Sig Start Date End Date Taking? Authorizing Provider  albuterol (PROAIR HFA) 108 (90 BASE) MCG/ACT  inhaler Inhale 2 puffs into the lungs every 6 (six) hours as needed for wheezing.    Yes [provider]  albuterol (PROVENTIL) (2.5 MG/3ML) 0.083% nebulizer solution Inhale 3 mLs into the lungs every 4 (four) hours as needed for wheezing.    Yes [provider]  aspirin EC 325 MG EC tablet Take 1 tablet (325 mg total) by mouth daily. 06/16/16  Yes Houston SirenSainani, Vivek J, MD  atorvastatin (LIPITOR) 40 MG tablet Take 1 tablet (40 mg total) by mouth daily at 6 PM. 06/16/16  Yes Sainani, Rolly PancakeVivek J, MD  hydrochlorothiazide (HYDRODIURIL) 25 MG tablet Take 12.5 mg by mouth daily.   Yes [provider]  lisinopril (PRINIVIL,ZESTRIL) 10 MG tablet Take 10 mg by mouth daily.   Yes [provider]  metFORMIN (GLUCOPHAGE) 500 MG tablet Take by mouth 2 (two) times daily with a meal.   Yes [provider]  metoprolol tartrate (LOPRESSOR) 25 MG tablet Take 1 tablet (25 mg total) by mouth 2 (two) times daily. 06/15/16  Yes Sainani, Rolly PancakeVivek J, MD  potassium chloride SA (K-DUR,KLOR-CON) 20 MEQ tablet Take 20 mEq by mouth daily.   Yes [provider]  SUBOXONE 8-2 MG FILM Place 1 Film (8 mg total) under the tongue 2 (two) times daily Marcille BuffyNADEAN [id number] 06/20/17  Yes [provider]    Inpatient Medications:  . aspirin  325 mg Oral Daily  . atorvastatin  40 mg Oral q1800  . budesonide (PULMICORT) nebulizer solution  0.5 mg Nebulization BID  . buprenorphine  8 mg Sublingual BID  . enoxaparin (LOVENOX) injection  40  mg Subcutaneous Q24H  . guaiFENesin  600 mg Oral BID  . hydrochlorothiazide  12.5 mg Oral Daily  . insulin aspart  0-20 Units Subcutaneous TID WC  . insulin glargine  25 Units Subcutaneous QHS  . ipratropium-albuterol  3 mL Nebulization Q6H  . lisinopril  10 mg Oral Daily  . methylPREDNISolone (SOLU-MEDROL) injection  60 mg Intravenous Q6H  . metoprolol tartrate  25 mg Oral BID  . nicotine  21 mg Transdermal Daily  . potassium chloride SA  20 mEq  Oral Daily  . sodium chloride flush  3 mL Intravenous Q12H   . sodium chloride    . cefTRIAXone (ROCEPHIN)  IV Stopped (07/04/17 1916)    Allergies: No Known Allergies  Social History   Social History  . Marital status: Single    Spouse name: N/A  . Number of children: N/A  . Years of education: N/A   Occupational History  . Not on file.   Social History Main Topics  . Smoking status: Current Every Day Smoker    Packs/day: 0.50    Years: 40.00    Types: Cigarettes  . Smokeless tobacco: Never Used  . Alcohol use 0.6 oz/week    1 Cans of beer per week  . Drug use: No  . Sexual activity: Not on file   Other Topics Concern  . Not on file   Social History Narrative  . No narrative on file     Family History  Problem Relation Age of Onset  . Bladder Cancer Mother   . Lung cancer Father      Review of Systems Positive for cough congestion Negative for: General:  chills, fever, night sweats or weight changes.  Cardiovascular: PND orthopnea syncope dizziness  Dermatological skin lesions rashes Respiratory: Positive for Cough congestion Urologic: Frequent urination urination at night and hematuria Abdominal: negative for nausea, vomiting, diarrhea, bright red blood per rectum, melena, or hematemesis Neurologic: negative for visual changes, and/or hearing changes  All other systems reviewed and are otherwise negative except as noted above.  Labs:  Recent Labs  07/02/17 2340 07/03/17 0614 07/03/17 1349 07/03/17 1853  TROPONINI <0.03 <0.03 0.03* 0.03*   Lab Results  Component Value Date   WBC 19.9 (H) 07/05/2017   HGB 13.8 07/05/2017   HCT 42.4 07/05/2017   MCV 93.7 07/05/2017   PLT 324 07/05/2017    Recent Labs Lab 07/02/17 1958  07/05/17 0538  NA 136  < > 137  K 4.0  < > 4.3  CL 101  < > 101  CO2 25  < > 27  BUN 12  < > 49*  CREATININE 0.76  < > 1.00  CALCIUM 8.4*  < > 8.5*  PROT 7.6  --   --   BILITOT 0.6  --   --   ALKPHOS 62  --   --    ALT 16*  --   --   AST 20  --   --   GLUCOSE 123*  < > 119*  < > = values in this interval not displayed. Lab Results  Component Value Date   CHOL 172 12/11/2012   HDL 32 (L) 12/11/2012   LDLCALC 132 (H) 12/11/2012   TRIG 41 12/11/2012   No results found for: DDIMER  Radiology/Studies:  Ct Angio Chest Pe W Or Wo Contrast  Result Date: 07/03/2017 CLINICAL DATA:  Shortness of breath and chest pain. EXAM: CT ANGIOGRAPHY CHEST WITH CONTRAST TECHNIQUE: Multidetector CT imaging of the  chest was performed using the standard protocol during bolus administration of intravenous contrast. Multiplanar CT image reconstructions and MIPs were obtained to evaluate the vascular anatomy. CONTRAST:  75 mL Isovue 370 IV COMPARISON:  06/15/2016 FINDINGS: Cardiovascular: Satisfactory opacification of the pulmonary arteries to the segmental level. No evidence of pulmonary embolism. Normal heart size. No pericardial effusion. Extensive calcified plaque again noted throughout the coronary arterial tree. Density in the LAD may correspond to a stent. There are calcifications associated with the aortic valve. The thoracic aorta is of normal caliber. Mediastinum/Nodes: No enlarged mediastinal, hilar, or axillary lymph nodes. Thyroid gland, trachea, and esophagus demonstrate no significant findings. Lungs/Pleura: Stable emphysematous disease and scarring in the inferior lingula. There is no evidence of pulmonary edema, consolidation, pneumothorax, nodule or pleural fluid. Upper Abdomen: No acute abnormality. Musculoskeletal: No chest wall abnormality. No acute or significant osseous findings. Review of the MIP images confirms the above findings. IMPRESSION: 1. No evidence of pulmonary embolism. 2. Coronary artery disease with calcified plaque as well as potential coronary stent. 3. Emphysematous lung disease without acute pulmonary process. Emphysema (ICD10-J43.9). Electronically Signed   By: Irish Lack M.D.   On:  07/03/2017 14:28   Dg Chest Port 1 View  Result Date: 07/02/2017 CLINICAL DATA:  Shortness of breath EXAM: PORTABLE CHEST 1 VIEW COMPARISON:  06/05/2016,  CT 06/15/2016 FINDINGS: Mild chronic interstitial changes. No focal consolidation or effusion. Stable cardiomediastinal silhouette with atherosclerosis. No pneumothorax. IMPRESSION: Chronic interstitial opacity.  No acute consolidation or edema. Electronically Signed   By: Jasmine Pang M.D.   On: 07/02/2017 20:50    EKG: Normal sinus rhythm  Weights: Filed Weights   07/03/17 0109  Weight: 85 kg (187 lb 6.4 oz)     Physical Exam: Blood pressure 105/62, pulse 61, temperature (!) 97.4 F (36.3 C), temperature source Oral, resp. rate (!) 22, height 5\' 4"  (1.626 m), weight 85 kg (187 lb 6.4 oz), SpO2 99 %. Body mass index is 32.17 kg/m. General: Well developed, well nourished, in no acute distress. Head eyes ears nose throat: Normocephalic, atraumatic, sclera non-icteric, no xanthomas, nares are without discharge. No apparent thyromegaly and/or mass  Lungs: Normal respiratory effort.  Diffuse significant wheezes, no rales, no rhonchi.  Heart: RRR with normal S1 S2. no murmur gallop, no rub, PMI is normal size and placement, carotid upstroke normal without bruit, jugular venous pressure is normal Abdomen: Soft, non-tender, non-distended with normoactive bowel sounds. No hepatomegaly. No rebound/guarding. No obvious abdominal masses. Abdominal aorta is normal size without bruit Extremities: Trace edema. no cyanosis, no clubbing, no ulcers  Peripheral : 2+ bilateral upper extremity pulses, 2+ bilateral femoral pulses, 2+ bilateral dorsal pedal pulse Neuro: Alert and oriented. No facial asymmetry. No focal deficit. Moves all extremities spontaneously. Musculoskeletal: Normal muscle tone without kyphosis Psych:  Responds to questions appropriately with a normal affect.    Assessment: 61 year old male with known coronary artery disease  status post recent PCI and stent placement on appropriate medication management having significant exacerbation of COPD hypoxia and respiratory distress with some atypical chest pain and no current evidence of unstable angina chest pain or concerns of the heart failure  Plan: 1. Continue supportive care of the significant respiratory distress and lower respiratory tract infection 2. High intensity cholesterol therapy with atorvastatin 3. Dual antiplatelet therapy with the Plavix and aspirin 4. Metoprolol lisinopril for hypertension control and previous non-ST elevation myocardial infarction 5. No further cardiac diagnostics necessary at this time 6. Begin ambulation and follow  for any further significant symptoms  Signed, Lamar Blinks M.D. [facility] Cardiology 07/05/2017, 1:09 PM

## 2017-07-06 LAB — GLUCOSE, CAPILLARY
GLUCOSE-CAPILLARY: 116 mg/dL — AB (ref 65–99)
GLUCOSE-CAPILLARY: 105 mg/dL — AB (ref 65–99)
GLUCOSE-CAPILLARY: 165 mg/dL — AB (ref 65–99)
Glucose-Capillary: 171 mg/dL — ABNORMAL HIGH (ref 65–99)
Glucose-Capillary: 178 mg/dL — ABNORMAL HIGH (ref 65–99)

## 2017-07-06 LAB — BASIC METABOLIC PANEL
Anion gap: 7 (ref 5–15)
BUN: 42 mg/dL — AB (ref 6–20)
CHLORIDE: 101 mmol/L (ref 101–111)
CO2: 30 mmol/L (ref 22–32)
CREATININE: 0.83 mg/dL (ref 0.61–1.24)
Calcium: 8.3 mg/dL — ABNORMAL LOW (ref 8.9–10.3)
GFR calc Af Amer: >60 mL/min (ref >60)
GFR calc non Af Amer: >60 mL/min (ref >60)
GLUCOSE: 123 mg/dL — AB (ref 65–99)
Potassium: 4.5 mmol/L (ref 3.5–5.1)
SODIUM: 138 mmol/L (ref 135–145)

## 2017-07-06 LAB — CBC
HCT: 42.9 % (ref 40.0–52.0)
HEMOGLOBIN: 13.9 g/dL (ref 13.0–18.0)
MCH: 30.2 pg (ref 26.0–34.0)
MCHC: 32.4 g/dL (ref 32.0–36.0)
MCV: 93.3 fL (ref 80.0–100.0)
PLATELETS: 312 10*3/uL (ref 150–440)
RBC: 4.6 MIL/uL (ref 4.40–5.90)
RDW: 14.3 % (ref 11.5–14.5)
WBC: 18.2 10*3/uL — ABNORMAL HIGH (ref 3.8–10.6)

## 2017-07-06 MED ORDER — ACETYLCYSTEINE 20 % IN SOLN
2.0000 mL | Freq: Two times a day (BID) | RESPIRATORY_TRACT | Status: DC
  Administered 2017-07-06 – 2017-07-08 (×5): 2 mL via RESPIRATORY_TRACT
  Filled 2017-07-06 (×6): qty 4

## 2017-07-06 NOTE — Care Management (Addendum)
Please add social worker to home health order at time of discharge.  Rolling walker has been delivered per WoodwardJason with [facility].

## 2017-07-06 NOTE — Progress Notes (Signed)
Patient ID: Kyle Rich, male   DOB: 04-27-1956, 61 y.o.   MRN: [id number]  [employer] PROGRESS NOTE  Kyle Rich [id number] DOB: 04-27-1956 DOA: 07/02/2017 PCP: [name], Sionne A, MD  HPI/Subjective: Patient felt better after talking with me.  Very anxious about things.  Very short of breath.  Short of breath worse when he is trying to do anything.  Patient has severe attacks where he thinks is going to die.  His breathing does not settle down for about 10-20 minutes afterwards.  He states he had 2 heart stents about 4 months ago.  Objective: Vitals:   07/06/17 0953 07/06/17 1241  BP: (!) 144/55 (!) 142/61  Pulse: 80 70  Resp: 20   Temp: 97.9 F (36.6 C) (!) 97.5 F (36.4 C)  SpO2: 96% 94%    Filed Weights   07/03/17 0109  Weight: 85 kg (187 lb 6.4 oz)    ROS: Review of Systems  Constitutional: Negative for chills and fever.  Eyes: Negative for blurred vision.  Respiratory: Positive for cough, shortness of breath and wheezing.   Cardiovascular: Negative for chest pain.  Gastrointestinal: Negative for abdominal pain, constipation, diarrhea, nausea and vomiting.  Genitourinary: Negative for dysuria.  Musculoskeletal: Negative for joint pain.  Neurological: Negative for dizziness and headaches.   Exam: Physical Exam  Constitutional: He is oriented to person, place, and time.  HENT:  Nose: No mucosal edema.  Mouth/Throat: No oropharyngeal exudate or posterior oropharyngeal edema.  Eyes: Pupils are equal, round, and reactive to light. Conjunctivae, EOM and lids are normal.  Neck: No JVD present. Carotid bruit is not present. No edema present. No thyroid mass and no thyromegaly present.  Cardiovascular: S1 normal and S2 normal.  Exam reveals no gallop.   No murmur heard. Pulses:      Dorsalis pedis pulses are 2+ on the right side, and 2+ on the left side.  Respiratory: No respiratory distress. He has no wheezes. He has no rhonchi. He has no rales.  GI: Soft.  Bowel sounds are normal. There is no tenderness.  Musculoskeletal:       Right ankle: He exhibits swelling.       Left ankle: He exhibits swelling.  Lymphadenopathy:    He has no cervical adenopathy.  Neurological: He is alert and oriented to person, place, and time. No cranial nerve deficit.  Skin: Skin is warm. No rash noted. Nails show no clubbing.  Psychiatric: He has a normal mood and affect.      Data Reviewed: Basic Metabolic Panel:  Recent Labs Lab 07/02/17 1958 07/03/17 [id number] 07/04/17 0448 07/05/17 0538 07/06/17 0508  NA 136  --  136 137 138  K 4.0  --  4.5 4.3 4.5  CL 101  --  103 101 101  CO2 25  --  28 27 30   GLUCOSE 123*  --  145* 119* 123*  BUN 12  --  32* 49* 42*  CREATININE 0.76 0.93 0.93 1.00 0.83  CALCIUM 8.4*  --  8.5* 8.5* 8.3*   Liver Function Tests:  Recent Labs Lab 07/02/17 1958  AST 20  ALT 16*  ALKPHOS 62  BILITOT 0.6  PROT 7.6  ALBUMIN 3.6   CBC:  Recent Labs Lab 07/02/17 1958 07/03/17 0614 07/04/17 0448 07/05/17 0538 07/06/17 0508  WBC 8.5 5.5 17.9* 19.9* 18.2*  NEUTROABS 5.7  --   --   --   --   HGB 13.3 13.4 13.6 13.8 13.9  HCT 40.6  40.5 41.9 42.4 42.9  MCV 92.6 93.5 93.2 93.7 93.3  PLT 256 263 302 324 312   Cardiac Enzymes:  Recent Labs Lab 07/02/17 1958 07/02/17 2340 07/03/17 0614 07/03/17 1349 07/03/17 1853  TROPONINI <0.03 <0.03 <0.03 0.03* 0.03*   BNP (last 3 results)  Recent Labs  07/02/17 1958  BNP 101.0*    CBG:  Recent Labs Lab 07/05/17 1142 07/05/17 1641 07/05/17 2143 07/06/17 0730 07/06/17 1139  GLUCAP 180* 96 171* 116* 178*    Recent Results (from the past 240 hour(s))  Culture, blood (routine x 2)     Status: None (Preliminary result)   Collection Time: 07/02/17  7:59 PM  Result Value Ref Range Status   Specimen Description BLOOD RIGHT HAND  Final   Special Requests   Final    BOTTLES DRAWN AEROBIC AND ANAEROBIC Blood Culture adequate volume   Culture NO GROWTH 4 DAYS  Final    Report Status PENDING  Incomplete  Culture, blood (routine x 2)     Status: None (Preliminary result)   Collection Time: 07/02/17  7:59 PM  Result Value Ref Range Status   Specimen Description BLOOD LEFT HAND  Final   Special Requests   Final    BOTTLES DRAWN AEROBIC AND ANAEROBIC Blood Culture adequate volume   Culture NO GROWTH 4 DAYS  Final   Report Status PENDING  Incomplete     Scheduled Meds: . acetylcysteine  2 mL Nebulization BID  . aspirin  325 mg Oral Daily  . atorvastatin  40 mg Oral q1800  . budesonide (PULMICORT) nebulizer solution  0.5 mg Nebulization BID  . buprenorphine  8 mg Sublingual BID  . enoxaparin (LOVENOX) injection  40 mg Subcutaneous Q24H  . guaiFENesin  600 mg Oral BID  . hydrochlorothiazide  12.5 mg Oral Daily  . insulin aspart  0-20 Units Subcutaneous TID WC  . insulin glargine  25 Units Subcutaneous QHS  . ipratropium-albuterol  3 mL Nebulization Q6H  . lisinopril  10 mg Oral Daily  . methylPREDNISolone (SOLU-MEDROL) injection  60 mg Intravenous Q8H  . metoprolol tartrate  25 mg Oral BID  . montelukast  10 mg Oral QHS  . nicotine  21 mg Transdermal Daily  . potassium chloride SA  20 mEq Oral Daily  . sodium chloride flush  3 mL Intravenous Q12H   Continuous Infusions: . sodium chloride    . cefTRIAXone (ROCEPHIN)  IV Stopped (07/05/17 1735)    Assessment/Plan:  1. Acute on chronic respiratory failure secondary to COPD exacerbation.  Patient still on high dose Solu-Medrol.  Patient empirically on Rocephin.  Asked nursing staff to taper him down to 2 L of oxygen. 2. Essential hypertension on hydrochlorothiazide, lisinopril and Lopressor 3. Diabetes mellitus on  glargine insulin and sliding scale insulin 4. Chronic systolic congestive heart failure.  Chest x-ray does not show signs of heart failure. 5. Hyperlipidemia unspecified on atorvastatin 6. History of CAD on aspirin and beta-blocker  Code Status:     Code Status Orders        Start      Ordered   07/03/17 0112  Full code  Continuous     07/03/17 0111    Code Status History    Date Active Date Inactive Code Status Order ID Comments User Context   06/15/2016 10:07 AM 06/15/2016 10:42 PM Full Code [id number]  Shaune Pollack, MD Inpatient   06/09/2015 11:59 AM 06/10/2015  3:27 PM Full Code [id number]  Houston Siren, MD  Inpatient      Disposition Plan: Will need to move better air prior to disposition  Antibiotics:  Rocephin  Time spent: 25 minutes  Alford Highland  [employer]

## 2017-07-06 NOTE — Care Management (Signed)
[facility] for RN and PT.  Heads up referral made to Tim with [facility].  Patient will need RW at discharge.  RNCM following

## 2017-07-07 LAB — CULTURE, BLOOD (ROUTINE X 2)
CULTURE: NO GROWTH
CULTURE: NO GROWTH
SPECIAL REQUESTS: ADEQUATE
Special Requests: ADEQUATE

## 2017-07-07 LAB — GLUCOSE, CAPILLARY
GLUCOSE-CAPILLARY: 139 mg/dL — AB (ref 65–99)
GLUCOSE-CAPILLARY: 217 mg/dL — AB (ref 65–99)
Glucose-Capillary: 128 mg/dL — ABNORMAL HIGH (ref 65–99)
Glucose-Capillary: 176 mg/dL — ABNORMAL HIGH (ref 65–99)
Glucose-Capillary: 127 mg/dL — ABNORMAL HIGH (ref 65–99)

## 2017-07-07 LAB — POTASSIUM: Potassium: 4.7 mmol/L (ref 3.5–5.1)

## 2017-07-07 MED ORDER — GUAIFENESIN-CODEINE 100-10 MG/5ML PO SOLN: 5.0000 mL | Freq: Four times a day (QID) | ORAL | Status: DC | PRN

## 2017-07-07 MED ORDER — METHYLPREDNISOLONE SODIUM SUCC 40 MG IJ SOLR
40.0000 mg | Freq: Two times a day (BID) | INTRAMUSCULAR | Status: DC
  Administered 2017-07-08 (×2): 40 mg via INTRAVENOUS
  Filled 2017-07-07 (×2): qty 1

## 2017-07-07 NOTE — Progress Notes (Signed)
Physical Therapy Treatment Patient Details Name: Kyle Rich MRN: [id number] DOB: 1956-06-10 Today's Date: 07/07/2017    History of Present Illness Pt presented to ER secondary to progressive SOB; admitted for management of COPD exacerbation    PT Comments    Pt able to progress to ambulation 60 feet and then another 60 feet with RW SBA (pt provided sitting and standing rest break with ambulation to monitor O2 sats which were 93% or greater on 2 L O2 via nasal cannula during session.  Pt very talkative during session and a little anxious (nursing reports recently giving pt medications for) but pt was very eager to ambulate (pt requiring education on pacing and activity modification to prevent over-doing activity; pt verbalizing good understanding).  Minimal SOB noted with activity.  Will continue to progress pt with strengthening and progressive functional mobility per pt tolerance.    Follow Up Recommendations   (May benefit from OP Pulmonary Rehab program)     Equipment Recommendations  Rolling walker with 5" wheels    Recommendations for Other Services       Precautions / Restrictions Precautions Precautions: Fall Restrictions Weight Bearing Restrictions: No    Mobility  Bed Mobility Overal bed mobility: Modified Independent             General bed mobility comments: Supine to/from sit with HOB elevated without any difficulties.  Transfers Overall transfer level: Needs assistance Equipment used: Rolling walker (2 wheeled) Transfers: Sit to/from Stand Sit to Stand: Supervision         General transfer comment: strong transfers; steady with RW  Ambulation/Gait Ambulation/Gait assistance: Supervision Ambulation Distance (Feet):  (60 feet; 60 feet) Assistive device: Rolling walker (2 wheeled) Gait Pattern/deviations: Step-through pattern Gait velocity: mildly decreased   General Gait Details: steady with RW   Stairs            Wheelchair Mobility     Modified Rankin (Stroke Patients Only)       Balance Overall balance assessment: Needs assistance Sitting-balance support: No upper extremity supported;Feet supported Sitting balance-Leahy Scale: Normal Sitting balance - Comments: sitting reaching outside BOS   Standing balance support: No upper extremity supported Standing balance-Leahy Scale: Good Standing balance comment: standing reaching within BOS without loss of balance                            Cognition Arousal/Alertness: Awake/alert Behavior During Therapy: Anxious Overall Cognitive Status: Within Functional Limits for tasks assessed                                        Exercises      General Comments   Nursing cleared pt for participation in physical therapy.  Pt agreeable to PT session.      Pertinent Vitals/Pain Pain Assessment: No/denies pain  Vitals (HR and O2 on 2 L via nasal cannula) stable and WFL throughout treatment session.    Home Living                      Prior Function            PT Goals (current goals can now be found in the care plan section) Acute Rehab PT Goals Patient Stated Goal: to return home PT Goal Formulation: With patient Time For Goal Achievement: 07/17/17 Potential to Achieve Goals: Good  Progress towards PT goals: Progressing toward goals    Frequency    Min 2X/week      PT Plan Frequency needs to be updated;Discharge plan needs to be updated    Co-evaluation              AM-PAC PT "6 Clicks" Daily Activity  Outcome Measure  Difficulty turning over in bed (including adjusting bedclothes, sheets and blankets)?: None Difficulty moving from lying on back to sitting on the side of the bed? : None Difficulty sitting down on and standing up from a chair with arms (e.g., wheelchair, bedside commode, etc,.)?: None Help needed moving to and from a bed to chair (including a wheelchair)?: A Little Help needed walking in  hospital room?: A Little Help needed climbing 3-5 steps with a railing? : A Little 6 Click Score: 21    End of Session Equipment Utilized During Treatment: Gait belt;Oxygen (2 L O2 via nasal cannula) Activity Tolerance: Patient tolerated treatment well Patient left: in bed;with call bell/phone within reach;with bed alarm set;with nursing/sitter in room Nurse Communication: Mobility status;Precautions PT Visit Diagnosis: Muscle weakness (generalized) (M62.81);Difficulty in walking, not elsewhere classified (R26.2)     Time: [id number] PT Time Calculation (min) (ACUTE ONLY): 23 min  Charges:  $Therapeutic Exercise: 8-22 mins $Therapeutic Activity: 8-22 mins                    G CodesHendricks Limes:       Ajmal Kathan, PT 07/07/17, 12:05 PM [phone]

## 2017-07-07 NOTE — Progress Notes (Signed)
Date: 07/07/2017,   MRN# [id number] Kyle Rich Nov 10, 1955 Code Status:     Code Status Orders        Start     Ordered   07/03/17 0112  Full code  Continuous     07/03/17 0111    Code Status History    Date Active Date Inactive Code Status Order ID Comments User Context   06/15/2016 10:07 AM 06/15/2016 10:42 PM Full Code [id number]  Shaune Pollackhen, Qing, MD Inpatient   06/09/2015 11:59 AM 06/10/2015  3:27 PM Full Code [id number]  Houston SirenSainani, Vivek J, MD Inpatient     Hosp day:@LENGTHOFSTAYDAYS @ Referring MD: @ATDPROV @      HPI: less wheezing, still coughing. More active, chest ct noted no acute infiltrates, no pe  PMHX:   Past Medical History:  Diagnosis Date  . Arthritis   . Asthma   . CHF (congestive heart failure) (HCC)   . COPD (chronic obstructive pulmonary disease) (HCC)   . Diabetes mellitus without complication (HCC)   . Hepatitis C   . Hypertension    Surgical Hx:  Past Surgical History:  Procedure Laterality Date  . CARDIAC CATHETERIZATION N/A 06/15/2016   Procedure: Left Heart Cath and Coronary Angiography PCI;  Surgeon: Alwyn Peawayne D Callwood, MD;  Location: [facility] INVASIVE CV LAB;  Service: Cardiovascular;  Laterality: N/A;   Family Hx:  Family History  Problem Relation Age of Onset  . Bladder Cancer Mother   . Lung cancer Father    Social Hx:   Social History  Substance Use Topics  . Smoking status: Current Every Day Smoker    Packs/day: 0.50    Years: 40.00    Types: Cigarettes  . Smokeless tobacco: Never Used  . Alcohol use 0.6 oz/week    1 Cans of beer per week   Medication:    Home Medication:    Current Medication: @CURMEDTAB @   Allergies:  Patient has no known allergies.  Review of Systems: Gen:  Denies  fever, sweats, chills HEENT: Denies blurred vision, double vision, ear pain, eye pain, hearing loss, nose bleeds, sore throat Cvc:  No dizziness, chest pain or heaviness Resp: less wheezing, still coughing    Gi: Denies swallowing difficulty,  stomach pain, nausea or vomiting, diarrhea, constipation, bowel incontinence Gu:  Denies bladder incontinence, burning urine Ext:   No Joint pain, stiffness or swelling Skin: No skin rash, easy bruising or bleeding or hives Endoc:  No polyuria, polydipsia , polyphagia or weight change Psych: No depression, insomnia or hallucinations  Other:  All other systems negative  Physical Examination:   VS: BP (!) 143/62 (BP Location: Right Arm)   Pulse 61   Temp 98.4 F (36.9 C)   Resp 20   Ht 5\' 4"  (1.626 m)   Wt 187 lb 6.4 oz (85 kg)   SpO2 100%   BMI 32.17 kg/m   General Appearance: less distress  Neuro: without focal findings, mental status, speech normal, alert and oriented, cranial nerves 2-12 intact, reflexes normal and symmetric, sensation grossly normal  HEENT: PERRLA, EOM intact, no ptosis, no other lesions noticed Neck: no stridor Pulmonary:.less wheezing, No rales   Cardiovascular:  Normal S1,S2.  No m/r/g.   Abdomen:Benign, Soft, non-tender, No masses, hepatosplenomegaly, No lymphadenopathy Endoc: No evident thyromegaly, no signs of acromegaly or Cushing features Skin:   warm, no rashes, no ecchymosis  Extremities: normal, no cyanosis, clubbing, no edema, warm with normal capillary refill.   Labs results:   Recent Labs  07/05/17  0538  07/06/17  0508  HGB  13.8  13.9  HCT  42.4  42.9  MCV  93.7  93.3  WBC  19.9*  18.2*  BUN  49*  42*  CREATININE  1.00  0.83  GLUCOSE  119*  123*  CALCIUM  8.5*  8.3*  ,  CLINICAL DATA:  Shortness of breath and chest pain.  EXAM: CT ANGIOGRAPHY CHEST WITH CONTRAST  TECHNIQUE: Multidetector CT imaging of the chest was performed using the standard protocol during bolus administration of intravenous contrast. Multiplanar CT image reconstructions and MIPs were obtained to evaluate the vascular anatomy.  CONTRAST:  75 mL Isovue 370 IV  COMPARISON:  06/15/2016  FINDINGS: Cardiovascular: Satisfactory opacification of the  pulmonary arteries to the segmental level. No evidence of pulmonary embolism. Normal heart size. No pericardial effusion. Extensive calcified plaque again noted throughout the coronary arterial tree. Density in the LAD may correspond to a stent. There are calcifications associated with the aortic valve. The thoracic aorta is of normal caliber.  Mediastinum/Nodes: No enlarged mediastinal, hilar, or axillary lymph nodes. Thyroid gland, trachea, and esophagus demonstrate no significant findings.  Lungs/Pleura: Stable emphysematous disease and scarring in the inferior lingula. There is no evidence of pulmonary edema, consolidation, pneumothorax, nodule or pleural fluid.  Upper Abdomen: No acute abnormality.  Musculoskeletal: No chest wall abnormality. No acute or significant osseous findings.  Review of the MIP images confirms the above findings.  IMPRESSION: 1. No evidence of pulmonary embolism. 2. Coronary artery disease with calcified plaque as well as potential coronary stent. 3. Emphysematous lung disease without acute pulmonary process.  Emphysema (ICD10-J43.9).   Electronically Signed   By: Irish Lack M.D.   On: 07/03/2017 14:28  Assessment and Plan: 1. COPD, Here with copd exacerbation, persistent wheezing, + veanxiety Continuebudenoside 0.5 bid via neb, duo neb  Start prednisone 40 mg and taper over 12 days Po emperic antibiotics Singualir 10 mg q hs  Robituusin with codeine DVT prophylaxis Follow up in pulmonary in one week post d/c  2. Hx of cad, decrease ef 25-30 %. Presently not in florid chf -watch salt -diuresis -following cardiology recs   3. Hx of sleep apnea, intolerant to cpap, sleep on 0xygen Weight loss Encourage to go back on cpap on d/c   I have personally obtained a history, examined the patient, evaluated laboratory and imaging results, formulated the assessment and plan and placed orders.  The Patient requires high  complexity decision making for assessment and support, frequent evaluation and titration of therapies, application of advanced monitoring technologies and extensive interpretation of multiple databases.   Herbon Fleming,M.D. Pulmonary & Critical care Medicine [facility]

## 2017-07-07 NOTE — Progress Notes (Signed)
Patient ID: Kyle Rich, male   DOB: July 18, 1956, 61 y.o.   MRN: [id number]  [employer] PROGRESS NOTE  Tyrail Grandfield Cumber ONG:[id number] DOB: 06/30/1956 DOA: 07/02/2017 PCP: Rayetta Humphrey, MD  HPI/Subjective: Patient feeling a little bit better today than yesterday.  He walked today with physical therapy and did not get short of breath as he has been.  He still does have coughing fits and short of breath and wheezing.  Objective: Vitals:   07/07/17 0522 07/07/17 0958  BP: (!) 182/80 (!) 143/62  Pulse: 61   Resp: 20   Temp: 98.4 F (36.9 C)   SpO2: 100%     Filed Weights   07/03/17 0109  Weight: 85 kg (187 lb 6.4 oz)    ROS: Review of Systems  Constitutional: Negative for chills and fever.  Eyes: Negative for blurred vision.  Respiratory: Positive for cough, shortness of breath and wheezing.   Cardiovascular: Negative for chest pain.  Gastrointestinal: Negative for abdominal pain, constipation, diarrhea, nausea and vomiting.  Genitourinary: Negative for dysuria.  Musculoskeletal: Negative for joint pain.  Neurological: Negative for dizziness and headaches.   Exam: Physical Exam  Constitutional: He is oriented to person, place, and time.  HENT:  Nose: No mucosal edema.  Mouth/Throat: No oropharyngeal exudate or posterior oropharyngeal edema.  Eyes: Pupils are equal, round, and reactive to light. Conjunctivae, EOM and lids are normal.  Neck: No JVD present. Carotid bruit is not present. No edema present. No thyroid mass and no thyromegaly present.  Cardiovascular: S1 normal and S2 normal.  Exam reveals no gallop.   No murmur heard. Pulses:      Dorsalis pedis pulses are 2+ on the right side, and 2+ on the left side.  Respiratory: No respiratory distress. He has decreased breath sounds in the right middle field, the right lower field, the left middle field and the left lower field. He has no wheezes. He has no rhonchi. He has no rales.  Upper airway wheeze heard  GI:  Soft. Bowel sounds are normal. There is no tenderness.  Musculoskeletal:       Right ankle: He exhibits swelling.       Left ankle: He exhibits swelling.  Lymphadenopathy:    He has no cervical adenopathy.  Neurological: He is alert and oriented to person, place, and time. No cranial nerve deficit.  Skin: Skin is warm. No rash noted. Nails show no clubbing.  Psychiatric: He has a normal mood and affect.      Data Reviewed: Basic Metabolic Panel:  Recent Labs Lab 07/02/17 1958 07/03/17 4401 07/04/17 0448 07/05/17 0538 07/06/17 0508  NA 136  --  136 137 138  K 4.0  --  4.5 4.3 4.5  CL 101  --  103 101 101  CO2 25  --  28 27 30   GLUCOSE 123*  --  145* 119* 123*  BUN 12  --  32* 49* 42*  CREATININE 0.76 0.93 0.93 1.00 0.83  CALCIUM 8.4*  --  8.5* 8.5* 8.3*   Liver Function Tests:  Recent Labs Lab 07/02/17 1958  AST 20  ALT 16*  ALKPHOS 62  BILITOT 0.6  PROT 7.6  ALBUMIN 3.6   CBC:  Recent Labs Lab 07/02/17 1958 07/03/17 0614 07/04/17 0448 07/05/17 0538 07/06/17 0508  WBC 8.5 5.5 17.9* 19.9* 18.2*  NEUTROABS 5.7  --   --   --   --   HGB 13.3 13.4 13.6 13.8 13.9  HCT 40.6 40.5  41.9 42.4 42.9  MCV 92.6 93.5 93.2 93.7 93.3  PLT 256 263 302 324 312   Cardiac Enzymes:  Recent Labs Lab 07/02/17 1958 07/02/17 2340 07/03/17 0614 07/03/17 1349 07/03/17 1853  TROPONINI <0.03 <0.03 <0.03 0.03* 0.03*   BNP (last 3 results)  Recent Labs  07/02/17 1958  BNP 101.0*    CBG:  Recent Labs Lab 07/06/17 1139 07/06/17 1645 07/06/17 2126 07/07/17 0732 07/07/17 1149  GLUCAP 178* 105* 165* 128* 139*    Recent Results (from the past 240 hour(s))  Culture, blood (routine x 2)     Status: None   Collection Time: 07/02/17  7:59 PM  Result Value Ref Range Status   Specimen Description BLOOD RIGHT HAND  Final   Special Requests   Final    BOTTLES DRAWN AEROBIC AND ANAEROBIC Blood Culture adequate volume   Culture NO GROWTH 5 DAYS  Final   Report Status  07/07/2017 FINAL  Final  Culture, blood (routine x 2)     Status: None   Collection Time: 07/02/17  7:59 PM  Result Value Ref Range Status   Specimen Description BLOOD LEFT HAND  Final   Special Requests   Final    BOTTLES DRAWN AEROBIC AND ANAEROBIC Blood Culture adequate volume   Culture NO GROWTH 5 DAYS  Final   Report Status 07/07/2017 FINAL  Final     Scheduled Meds: . acetylcysteine  2 mL Nebulization BID  . aspirin  325 mg Oral Daily  . atorvastatin  40 mg Oral q1800  . budesonide (PULMICORT) nebulizer solution  0.5 mg Nebulization BID  . buprenorphine  8 mg Sublingual BID  . enoxaparin (LOVENOX) injection  40 mg Subcutaneous Q24H  . guaiFENesin  600 mg Oral BID  . hydrochlorothiazide  12.5 mg Oral Daily  . insulin aspart  0-20 Units Subcutaneous TID WC  . insulin glargine  25 Units Subcutaneous QHS  . ipratropium-albuterol  3 mL Nebulization Q6H  . lisinopril  10 mg Oral Daily  . methylPREDNISolone (SOLU-MEDROL) injection  60 mg Intravenous Q8H  . metoprolol tartrate  25 mg Oral BID  . montelukast  10 mg Oral QHS  . nicotine  21 mg Transdermal Daily  . potassium chloride SA  20 mEq Oral Daily  . sodium chloride flush  3 mL Intravenous Q12H   Continuous Infusions: . sodium chloride    . cefTRIAXone (ROCEPHIN)  IV Stopped (07/06/17 1749)    Assessment/Plan:  1. Acute on chronic respiratory failure secondary to COPD exacerbation.  Patient still on high dose Solu-Medrol.  Patient empirically on Rocephin.  Back to baseline oxygen.  Evaluate on daily basis on whether or not the patient can be discharged.  Mucomyst nebulizers added yesterday. 2. Essential hypertension on hydrochlorothiazide, lisinopril and Lopressor 3. Diabetes mellitus on glargine insulin and sliding scale insulin 4. Chronic systolic congestive heart failure.  Chest x-ray does not show signs of heart failure. 5. Hyperlipidemia unspecified on atorvastatin 6. History of CAD on aspirin and  beta-blocker  Code Status:     Code Status Orders        Start     Ordered   07/03/17 0112  Full code  Continuous     07/03/17 0111    Code Status History    Date Active Date Inactive Code Status Order ID Comments User Context   06/15/2016 10:07 AM 06/15/2016 10:42 PM Full Code [id number]  Shaune Pollack, MD Inpatient   06/09/2015 11:59 AM 06/10/2015  3:27 PM Full  Code [id number]  [place] SirenSainani, Vivek J, MD Inpatient      Disposition Plan: Evaluate daily for disposition at this point  Antibiotics:  Rocephin  Time spent: 25 minutes  Alford HighlandWIETING, Asher Babilonia  [employer]

## 2017-07-08 LAB — GLUCOSE, CAPILLARY
GLUCOSE-CAPILLARY: 130 mg/dL — AB (ref 65–99)
Glucose-Capillary: 125 mg/dL — ABNORMAL HIGH (ref 65–99)

## 2017-07-08 MED ORDER — PREDNISONE 10 MG (21) PO TBPK: ORAL_TABLET | ORAL | 0 refills | Status: DC

## 2017-07-08 NOTE — Progress Notes (Signed)
Kyle SkyeJames D Liberatore  A and O x 4. VSS. Pt tolerating diet well. No complaints of pain or nausea. IV removed intact, prescriptions given. Pt voiced understanding of discharge instructions with no further questions. Pt discharged via wheelchair with nurse tech.    Allergies as of 07/08/2017   No Known Allergies     Medication List    TAKE these medications   aspirin 325 MG EC tablet Take 1 tablet (325 mg total) by mouth daily.   atorvastatin 40 MG tablet Commonly known as:  LIPITOR Take 1 tablet (40 mg total) by mouth daily at 6 PM.   hydrochlorothiazide 25 MG tablet Commonly known as:  HYDRODIURIL Take 12.5 mg by mouth daily.   lisinopril 10 MG tablet Commonly known as:  PRINIVIL,ZESTRIL Take 10 mg by mouth daily.   metFORMIN 500 MG tablet Commonly known as:  GLUCOPHAGE Take by mouth 2 (two) times daily with a meal.   metoprolol tartrate 25 MG tablet Commonly known as:  LOPRESSOR Take 1 tablet (25 mg total) by mouth 2 (two) times daily.   potassium chloride SA 20 MEQ tablet Commonly known as:  K-DUR,KLOR-CON Take 20 mEq by mouth daily.   predniSONE 10 MG (21) Tbpk tablet Commonly known as:  STERAPRED UNI-PAK 21 TAB 60 mg day 1 and taper 10 mg daily   PROAIR HFA 108 (90 Base) MCG/ACT inhaler Generic drug:  albuterol Inhale 2 puffs into the lungs every 6 (six) hours as needed for wheezing.   albuterol (2.5 MG/3ML) 0.083% nebulizer solution Commonly known as:  PROVENTIL Inhale 3 mLs into the lungs every 4 (four) hours as needed for wheezing.   SUBOXONE 8-2 MG Film Generic drug:  Buprenorphine HCl-Naloxone HCl Place 1 Film (8 mg total) under the tongue 2 (two) times daily NADEAN [id number]       Vitals:   07/08/17 0425 07/08/17 1056  BP: (!) 158/69 (!) 118/94  Pulse: (!) 56 71  Resp: 19 18  Temp: (!) 97.4 F (36.3 C)   SpO2: 98% 95%    Suzzanne CloudJuan G Rodriguez Ornelas

## 2017-07-08 NOTE — Care Management Note (Signed)
Case Management Note  Patient Details  Name: Danford BadJames D Slezak MRN: [id number] Date of Birth: 1956/06/25  Subjective/Objective:     A referral for HH=RN, SW was [facility] per Mr Pages choice. A RW was delivered yesterday by [facility].                Action/Plan:   Expected Discharge Date:  07/08/17               Expected Discharge Plan:  Home w Home Health Services  In-House Referral:  NA  Discharge planning Services  CM Consult  Post Acute Care Choice:  Home Health, Durable Medical Equipment Choice offered to:     DME Arranged:  Walker rolling DME Agency:  [facility]  HH Arranged:  Social Work, Charity fundraiserN HH Agency:  [facility]  Status of Service:  Completed, signed off  If discussed at [employer] Meetings, dates discussed:    Additional Comments:  Rawan Riendeau A, RN 07/08/2017, 1:41 PM

## 2017-07-08 NOTE — Progress Notes (Addendum)
Advance care planning  Discussed with patient regarding his end-stage COPD and also chronic chronic systolic congestive heart failure.  Today patient feels he is back to baseline with his breathing.  Continues to wheeze which is normal.  On oxygen. We discussed regarding his CODE STATUS.  Patient wants his daughter to be his healthcare power of attorney.  Initially patient did not want to be intubated or resuscitated.  Later I explained to him to DO NOT RESUSCITATE status and after this he wanted to be a partial code with no intubation or ventilator but okay for CPR and defibrillation. Patient was not aware of his end-stage COPD diagnosis and got anxious.  Explained to him that he needs to quit smoking and have close follow-up with his pulmonologist and cardiologist. Setting up home health at discharge.  Total time spent on ACP 20 minutes.

## 2017-07-11 NOTE — Discharge Summary (Signed)
[facility] [place] [facility]   PATIENT NAME: Kyle Rich Cancel    MR#:  [id number]  DATE OF BIRTH:  July 07, 1956  DATE OF ADMISSION:  07/02/2017 ADMITTING PHYSICIAN: Bertrum SolMontell D Salary, MD  DATE OF DISCHARGE: 07/08/2017  4:13 PM  PRIMARY CARE PHYSICIAN: [name], Sionne A, MD   ADMISSION DIAGNOSIS:  Cough [R05] Respiratory distress [R06.03] COPD exacerbation (HCC) [J44.1]  DISCHARGE DIAGNOSIS:  Active Problems:   COPD exacerbation (HCC)   SECONDARY DIAGNOSIS:   Past Medical History:  Diagnosis Date  . Arthritis   . Asthma   . CHF (congestive heart failure) (HCC)   . COPD (chronic obstructive pulmonary disease) (HCC)   . Diabetes mellitus without complication (HCC)   . Hepatitis C   . Hypertension      ADMITTING HISTORY  HISTORY OF PRESENT ILLNESS: Kyle Rich Kyle Rich  is a 61 y.o. male with a known history of COPD, chronic respiratory failure on 2 L at home on, was brought to the emergency room with 1 day history of worsening shortness of breath, patient was noted to be in the 80s by EMS, was given IV Solu-Medrol/DuoNeb and placed on BiPAP, in the emergency room patient was noted to be tripoding, ER workup noted for chronic interstitial opacity on chest x-ray/no acute process, EKG was negative, blood gas noted for compensation, BNP 101, troponin was negative, hospital service subsequent contacted for further evaluation/care, patient evaluated in emergency room, in no apparent distress, patient currently on oxygen via nasal cannula, patient admits to continued tobacco smoking, patient is now admitted for acute on COPD exacerbation.    HOSPITAL COURSE:   *Acute on chronic hypoxic respiratory failure *COPD exacerbation *Hypertension *Diabetes mellitus *Chronic systolic congestive heart failure *CAD  Patient was admitted to medical floor for COPD exacerbation.  He was started on IV steroids, antibiotics and scheduled nebulizers.  He slowly improved and his oxygen was  weaned down to his baseline.  Patient continued to have some mild wheezing by the day of discharge but felt his breathing was back to normal.  He does have follow-up with pulmonary and cardiology as outpatient.  Will be transitioned to oral prednisone at discharge.  Patient is being discharged home in stable condition.  CONSULTS OBTAINED:  Treatment Team:  Mertie MooresFleming, Herbon E, MD Lamar BlinksKowalski, Bruce J, MD  DRUG ALLERGIES:  No Known Allergies  DISCHARGE MEDICATIONS:   Discharge Medication List as of 07/08/2017  3:27 PM    START taking these medications   Details  predniSONE (STERAPRED UNI-PAK 21 TAB) 10 MG (21) TBPK tablet 60 mg day 1 and taper 10 mg daily, Normal      CONTINUE these medications which have NOT CHANGED   Details  albuterol (PROAIR HFA) 108 (90 BASE) MCG/ACT inhaler Inhale 2 puffs into the lungs every 6 (six) hours as needed for wheezing. , Historical Med    albuterol (PROVENTIL) (2.5 MG/3ML) 0.083% nebulizer solution Inhale 3 mLs into the lungs every 4 (four) hours as needed for wheezing. , Historical Med    aspirin EC 325 MG EC tablet Take 1 tablet (325 mg total) by mouth daily., Starting Thu 06/16/2016, No Print    atorvastatin (LIPITOR) 40 MG tablet Take 1 tablet (40 mg total) by mouth daily at 6 PM., Starting Thu 06/16/2016, No Print    hydrochlorothiazide (HYDRODIURIL) 25 MG tablet Take 12.5 mg by mouth daily., Historical Med    lisinopril (PRINIVIL,ZESTRIL) 10 MG tablet Take 10 mg by mouth daily., Historical Med  metFORMIN (GLUCOPHAGE) 500 MG tablet Take by mouth 2 (two) times daily with a meal., Historical Med    metoprolol tartrate (LOPRESSOR) 25 MG tablet Take 1 tablet (25 mg total) by mouth 2 (two) times daily., Starting Wed 06/15/2016, No Print    potassium chloride SA (K-DUR,KLOR-CON) 20 MEQ tablet Take 20 mEq by mouth daily., Historical Med    SUBOXONE 8-2 MG FILM Place 1 Film (8 mg total) under the tongue 2 (two) times daily NADEAN [id number], Historical  Med        Today   VITAL SIGNS:  Blood pressure (!) 118/94, pulse 71, temperature (!) 97.4 F (36.3 C), temperature source Oral, resp. rate 18, height 5\' 4"  (1.626 m), weight 85 kg (187 lb 6.4 oz), SpO2 95 %.  I/O:  No intake or output data in the 24 hours ending 07/11/17 1428  PHYSICAL EXAMINATION:  Physical Exam  GENERAL:  61 y.o.-year-old patient lying in the bed with no acute distress.  LUNGS: Normal breath sounds bilaterally, no wheezing, rales,rhonchi or crepitation. No use of accessory muscles of respiration.  CARDIOVASCULAR: S1, S2 normal. No murmurs, rubs, or gallops.  ABDOMEN: Soft, non-tender, non-distended. Bowel sounds present. No organomegaly or mass.  NEUROLOGIC: Moves all 4 extremities. PSYCHIATRIC: The patient is alert and oriented x 3.  SKIN: No obvious rash, lesion, or ulcer.   DATA REVIEW:   CBC Recent Labs  Lab 07/06/17 0508  WBC 18.2*  HGB 13.9  HCT 42.9  PLT 312    Chemistries  Recent Labs  Lab 07/06/17 0508 07/07/17 1953  NA 138  --   K 4.5 4.7  CL 101  --   CO2 30  --   GLUCOSE 123*  --   BUN 42*  --   CREATININE 0.83  --   CALCIUM 8.3*  --     Cardiac Enzymes No results for input(s): TROPONINI in the last 168 hours.  Microbiology Results  Results for orders placed or performed during the hospital encounter of 07/02/17  Culture, blood (routine x 2)     Status: None   Collection Time: 07/02/17  7:59 PM  Result Value Ref Range Status   Specimen Description BLOOD RIGHT HAND  Final   Special Requests   Final    BOTTLES DRAWN AEROBIC AND ANAEROBIC Blood Culture adequate volume   Culture NO GROWTH 5 DAYS  Final   Report Status 07/07/2017 FINAL  Final  Culture, blood (routine x 2)     Status: None   Collection Time: 07/02/17  7:59 PM  Result Value Ref Range Status   Specimen Description BLOOD LEFT HAND  Final   Special Requests   Final    BOTTLES DRAWN AEROBIC AND ANAEROBIC Blood Culture adequate volume   Culture NO GROWTH 5  DAYS  Final   Report Status 07/07/2017 FINAL  Final    RADIOLOGY:  No results found.  Follow up with PCP in 1 week.  Management plans discussed with the patient, family and they are in agreement.  CODE STATUS:  Code Status History    Date Active Date Inactive Code Status Order ID Comments User Context   07/08/2017 14:12 07/08/2017 19:13 Partial Code [id number]  Milagros LollSudini, Makyia Erxleben, MD Inpatient   07/03/2017 01:11 07/08/2017 14:12 Full Code [id number]  Bertrum SolSalary, Montell D, MD Inpatient   06/15/2016 10:07 06/15/2016 22:42 Full Code [id number]  Shaune Pollackhen, Qing, MD Inpatient   06/09/2015 11:59 06/10/2015 15:27 Full Code [id number]  Houston SirenSainani, Vivek J, MD Inpatient    Questions  for Most Recent Historical Code Status (Order [id number])    Question Answer Comment   In the event of cardiac or respiratory ARREST: Initiate Code Blue, Call Rapid Response Yes    In the event of cardiac or respiratory ARREST: Perform CPR Yes    In the event of cardiac or respiratory ARREST: Perform Intubation/Mechanical Ventilation No    In the event of cardiac or respiratory ARREST: Use NIPPV/BiPAp only if indicated Yes    In the event of cardiac or respiratory ARREST: Administer ACLS medications if indicated Yes    In the event of cardiac or respiratory ARREST: Perform Defibrillation or Cardioversion if indicated Yes       TOTAL TIME TAKING CARE OF THIS PATIENT ON DAY OF DISCHARGE: more than 30 minutes.   Milagros Loll R M.D on 07/11/2017 at 2:28 PM  Between 7am to 6pm - Pager - [phone]  After 6pm go to [email or web address] - password [email or web address] [facility]  [facility]  Office  [phone]  CC: Primary care physician; Rayetta Humphrey, MD  Note: This dictation was prepared with Dragon dictation along with smaller phrase technology. Any transcriptional errors that result from this process are unintentional.

## 2017-07-19 ENCOUNTER — Other Ambulatory Visit: Payer: Self-pay

## 2017-07-19 ENCOUNTER — Emergency Department: Payer: Medicaid Other

## 2017-07-19 ENCOUNTER — Inpatient Hospital Stay
Admission: EM | Admit: 2017-07-19 | Discharge: 2017-07-23 | DRG: 190 | Disposition: A | Discharge status: Home Health | Payer: Medicaid Other | Attending: Internal Medicine | Admitting: Internal Medicine

## 2017-07-19 DIAGNOSIS — F17210 Nicotine dependence, cigarettes, uncomplicated: Secondary | ICD-10-CM | POA: Diagnosis present

## 2017-07-19 DIAGNOSIS — I10 Essential (primary) hypertension: Secondary | ICD-10-CM | POA: Diagnosis present

## 2017-07-19 DIAGNOSIS — Z8619 Personal history of other infectious and parasitic diseases: Secondary | ICD-10-CM

## 2017-07-19 DIAGNOSIS — R748 Abnormal levels of other serum enzymes: Secondary | ICD-10-CM | POA: Diagnosis present

## 2017-07-19 DIAGNOSIS — Z7982 Long term (current) use of aspirin: Secondary | ICD-10-CM

## 2017-07-19 DIAGNOSIS — Z7951 Long term (current) use of inhaled steroids: Secondary | ICD-10-CM | POA: Diagnosis not present

## 2017-07-19 DIAGNOSIS — J441 Chronic obstructive pulmonary disease with (acute) exacerbation: Secondary | ICD-10-CM | POA: Diagnosis present

## 2017-07-19 DIAGNOSIS — M1990 Unspecified osteoarthritis, unspecified site: Secondary | ICD-10-CM | POA: Diagnosis present

## 2017-07-19 DIAGNOSIS — F410 Panic disorder [episodic paroxysmal anxiety] without agoraphobia: Secondary | ICD-10-CM | POA: Diagnosis present

## 2017-07-19 DIAGNOSIS — Z7952 Long term (current) use of systemic steroids: Secondary | ICD-10-CM

## 2017-07-19 DIAGNOSIS — Z6832 Body mass index (BMI) 32.0-32.9, adult: Secondary | ICD-10-CM

## 2017-07-19 DIAGNOSIS — E785 Hyperlipidemia, unspecified: Secondary | ICD-10-CM | POA: Diagnosis present

## 2017-07-19 DIAGNOSIS — R490 Dysphonia: Secondary | ICD-10-CM | POA: Diagnosis present

## 2017-07-19 DIAGNOSIS — Z9981 Dependence on supplemental oxygen: Secondary | ICD-10-CM | POA: Diagnosis not present

## 2017-07-19 DIAGNOSIS — I252 Old myocardial infarction: Secondary | ICD-10-CM

## 2017-07-19 DIAGNOSIS — J9621 Acute and chronic respiratory failure with hypoxia: Secondary | ICD-10-CM | POA: Diagnosis present

## 2017-07-19 DIAGNOSIS — F329 Major depressive disorder, single episode, unspecified: Secondary | ICD-10-CM | POA: Diagnosis present

## 2017-07-19 DIAGNOSIS — Z7984 Long term (current) use of oral hypoglycemic drugs: Secondary | ICD-10-CM | POA: Diagnosis not present

## 2017-07-19 DIAGNOSIS — I2510 Atherosclerotic heart disease of native coronary artery without angina pectoris: Secondary | ICD-10-CM | POA: Diagnosis present

## 2017-07-19 DIAGNOSIS — E119 Type 2 diabetes mellitus without complications: Secondary | ICD-10-CM | POA: Diagnosis present

## 2017-07-19 DIAGNOSIS — E669 Obesity, unspecified: Secondary | ICD-10-CM | POA: Diagnosis present

## 2017-07-19 DIAGNOSIS — R778 Other specified abnormalities of plasma proteins: Secondary | ICD-10-CM

## 2017-07-19 DIAGNOSIS — Z634 Disappearance and death of family member: Secondary | ICD-10-CM

## 2017-07-19 DIAGNOSIS — J449 Chronic obstructive pulmonary disease, unspecified: Secondary | ICD-10-CM | POA: Diagnosis present

## 2017-07-19 DIAGNOSIS — Z79899 Other long term (current) drug therapy: Secondary | ICD-10-CM

## 2017-07-19 DIAGNOSIS — J040 Acute laryngitis: Secondary | ICD-10-CM | POA: Diagnosis present

## 2017-07-19 DIAGNOSIS — R7989 Other specified abnormal findings of blood chemistry: Secondary | ICD-10-CM

## 2017-07-19 DIAGNOSIS — K219 Gastro-esophageal reflux disease without esophagitis: Secondary | ICD-10-CM | POA: Diagnosis present

## 2017-07-19 LAB — CBC
HEMATOCRIT: 40 % (ref 40.0–52.0)
Hemoglobin: 12.8 g/dL — ABNORMAL LOW (ref 13.0–18.0)
MCH: 29.7 pg (ref 26.0–34.0)
MCHC: 32 g/dL (ref 32.0–36.0)
MCV: 93 fL (ref 80.0–100.0)
PLATELETS: 258 10*3/uL (ref 150–440)
RBC: 4.3 MIL/uL — ABNORMAL LOW (ref 4.40–5.90)
RDW: 14.2 % (ref 11.5–14.5)
WBC: 11.2 10*3/uL — ABNORMAL HIGH (ref 3.8–10.6)

## 2017-07-19 LAB — BASIC METABOLIC PANEL
Anion gap: 8 (ref 5–15)
BUN: 21 mg/dL — AB (ref 6–20)
CO2: 34 mmol/L — ABNORMAL HIGH (ref 22–32)
Calcium: 8.7 mg/dL — ABNORMAL LOW (ref 8.9–10.3)
Chloride: 96 mmol/L — ABNORMAL LOW (ref 101–111)
Creatinine, Ser: 0.97 mg/dL (ref 0.61–1.24)
GFR calc Af Amer: >60 mL/min (ref >60)
GLUCOSE: 176 mg/dL — AB (ref 65–99)
POTASSIUM: 3.7 mmol/L (ref 3.5–5.1)
Sodium: 138 mmol/L (ref 135–145)

## 2017-07-19 LAB — INFLUENZA PANEL BY PCR (TYPE A & B)
Influenza A By PCR: NEGATIVE
Influenza B By PCR: NEGATIVE

## 2017-07-19 LAB — TROPONIN I
TROPONIN I: 0.08 ng/mL — AB (ref <0.03)
Troponin I: 0.08 ng/mL (ref <0.03)
Troponin I: 0.07 ng/mL (ref <0.03)

## 2017-07-19 LAB — RAPID HIV SCREEN (HIV 1/2 AB+AG)
HIV 1/2 ANTIBODIES: NONREACTIVE
HIV-1 P24 ANTIGEN - HIV24: NONREACTIVE

## 2017-07-19 LAB — PROCALCITONIN: Procalcitonin: <0.1 ng/mL

## 2017-07-19 LAB — BRAIN NATRIURETIC PEPTIDE: B Natriuretic Peptide: 246 pg/mL — ABNORMAL HIGH (ref 0.0–100.0)

## 2017-07-19 LAB — GLUCOSE, CAPILLARY
GLUCOSE-CAPILLARY: 161 mg/dL — AB (ref 65–99)
Glucose-Capillary: 175 mg/dL — ABNORMAL HIGH (ref 65–99)

## 2017-07-19 MED ORDER — IPRATROPIUM-ALBUTEROL 0.5-2.5 (3) MG/3ML IN SOLN
3.0000 mL | Freq: Four times a day (QID) | RESPIRATORY_TRACT | Status: DC
  Administered 2017-07-19 – 2017-07-23 (×16): 3 mL via RESPIRATORY_TRACT
  Filled 2017-07-19 (×15): qty 3

## 2017-07-19 MED ORDER — METHYLPREDNISOLONE SODIUM SUCC 125 MG IJ SOLR
INTRAMUSCULAR | Status: AC
  Administered 2017-07-19: 125 mg via INTRAVENOUS
  Filled 2017-07-19: qty 2

## 2017-07-19 MED ORDER — ASPIRIN EC 325 MG PO TBEC
325.0000 mg | DELAYED_RELEASE_TABLET | Freq: Every day | ORAL | Status: DC
  Administered 2017-07-19 – 2017-07-23 (×5): 325 mg via ORAL
  Filled 2017-07-19 (×5): qty 1

## 2017-07-19 MED ORDER — BUDESONIDE 0.25 MG/2ML IN SUSP
RESPIRATORY_TRACT | Status: AC
  Filled 2017-07-19: qty 4

## 2017-07-19 MED ORDER — NITROGLYCERIN 2 % TD OINT
1.0000 [in_us] | TOPICAL_OINTMENT | Freq: Once | TRANSDERMAL | Status: AC
  Administered 2017-07-19: 1 [in_us] via TOPICAL
  Filled 2017-07-19: qty 1

## 2017-07-19 MED ORDER — ACETAMINOPHEN 650 MG RE SUPP
650.0000 mg | Freq: Four times a day (QID) | RECTAL | Status: DC | PRN
Start: 2017-07-19 — End: 2017-07-23

## 2017-07-19 MED ORDER — LEVOFLOXACIN IN D5W 750 MG/150ML IV SOLN
750.0000 mg | Freq: Once | INTRAVENOUS | Status: AC
  Administered 2017-07-19: 750 mg via INTRAVENOUS
  Filled 2017-07-19: qty 150

## 2017-07-19 MED ORDER — ESCITALOPRAM OXALATE 10 MG PO TABS
10.0000 mg | ORAL_TABLET | Freq: Every day | ORAL | Status: DC
  Administered 2017-07-19 – 2017-07-21 (×3): 10 mg via ORAL
  Filled 2017-07-19 (×3): qty 1

## 2017-07-19 MED ORDER — METOPROLOL TARTRATE 25 MG PO TABS
25.0000 mg | ORAL_TABLET | Freq: Two times a day (BID) | ORAL | Status: DC
  Administered 2017-07-19 – 2017-07-23 (×8): 25 mg via ORAL
  Filled 2017-07-19 (×8): qty 1

## 2017-07-19 MED ORDER — INSULIN ASPART 100 UNIT/ML ~~LOC~~ SOLN
0.0000 [IU] | Freq: Three times a day (TID) | SUBCUTANEOUS | Status: DC
  Administered 2017-07-19: 3 [IU] via SUBCUTANEOUS
  Administered 2017-07-20: 2 [IU] via SUBCUTANEOUS
  Administered 2017-07-20: 5 [IU] via SUBCUTANEOUS
  Administered 2017-07-20 – 2017-07-21 (×2): 2 [IU] via SUBCUTANEOUS
  Administered 2017-07-21: 12:00:00 8 [IU] via SUBCUTANEOUS
  Administered 2017-07-22 (×2): 3 [IU] via SUBCUTANEOUS
  Filled 2017-07-19 (×8): qty 1

## 2017-07-19 MED ORDER — METOPROLOL TARTRATE 25 MG PO TABS
ORAL_TABLET | ORAL | Status: AC
  Administered 2017-07-19: 25 mg via ORAL
  Filled 2017-07-19: qty 1

## 2017-07-19 MED ORDER — ONDANSETRON HCL 4 MG/2ML IJ SOLN: 4.0000 mg | Freq: Four times a day (QID) | INTRAMUSCULAR | Status: DC | PRN

## 2017-07-19 MED ORDER — ACETAMINOPHEN 325 MG PO TABS: 650.0000 mg | ORAL_TABLET | Freq: Four times a day (QID) | ORAL | Status: DC | PRN

## 2017-07-19 MED ORDER — ACETYLCYSTEINE 20 % IN SOLN
3.0000 mL | Freq: Two times a day (BID) | RESPIRATORY_TRACT | Status: DC
  Administered 2017-07-19 – 2017-07-20 (×3): 3 mL via RESPIRATORY_TRACT
  Administered 2017-07-20: 21:00:00 4 mL via RESPIRATORY_TRACT
  Administered 2017-07-21: 20:00:00 via RESPIRATORY_TRACT
  Filled 2017-07-19 (×7): qty 4

## 2017-07-19 MED ORDER — BUDESONIDE 0.5 MG/2ML IN SUSP
0.5000 mg | Freq: Two times a day (BID) | RESPIRATORY_TRACT | Status: DC
  Administered 2017-07-19 – 2017-07-23 (×9): 0.5 mg via RESPIRATORY_TRACT
  Filled 2017-07-19 (×8): qty 2

## 2017-07-19 MED ORDER — ALBUTEROL SULFATE (2.5 MG/3ML) 0.083% IN NEBU
5.0000 mg | INHALATION_SOLUTION | Freq: Once | RESPIRATORY_TRACT | Status: AC
  Administered 2017-07-19: 5 mg via RESPIRATORY_TRACT
  Filled 2017-07-19: qty 6

## 2017-07-19 MED ORDER — IPRATROPIUM-ALBUTEROL 0.5-2.5 (3) MG/3ML IN SOLN
3.0000 mL | Freq: Once | RESPIRATORY_TRACT | Status: AC
  Administered 2017-07-19: 3 mL via RESPIRATORY_TRACT

## 2017-07-19 MED ORDER — BUPRENORPHINE HCL-NALOXONE HCL 8-2 MG SL SUBL
1.0000 | SUBLINGUAL_TABLET | Freq: Two times a day (BID) | SUBLINGUAL | Status: DC
  Administered 2017-07-19 – 2017-07-23 (×8): 1 via SUBLINGUAL
  Filled 2017-07-19 (×8): qty 1

## 2017-07-19 MED ORDER — LISINOPRIL 10 MG PO TABS
ORAL_TABLET | ORAL | Status: AC
  Administered 2017-07-19: 10 mg via ORAL
  Filled 2017-07-19: qty 1

## 2017-07-19 MED ORDER — METHYLPREDNISOLONE SODIUM SUCC 125 MG IJ SOLR
125.0000 mg | Freq: Once | INTRAMUSCULAR | Status: AC
  Administered 2017-07-19: 125 mg via INTRAVENOUS

## 2017-07-19 MED ORDER — INSULIN ASPART 100 UNIT/ML ~~LOC~~ SOLN
0.0000 [IU] | Freq: Every day | SUBCUTANEOUS | Status: DC
Start: 2017-07-19 — End: 2017-07-23

## 2017-07-19 MED ORDER — ENOXAPARIN SODIUM 40 MG/0.4ML ~~LOC~~ SOLN
SUBCUTANEOUS | Status: AC
  Filled 2017-07-19: qty 0.4

## 2017-07-19 MED ORDER — ATORVASTATIN CALCIUM 20 MG PO TABS
40.0000 mg | ORAL_TABLET | Freq: Every day | ORAL | Status: DC
  Administered 2017-07-19 – 2017-07-22 (×4): 40 mg via ORAL
  Filled 2017-07-19 (×4): qty 2

## 2017-07-19 MED ORDER — ENOXAPARIN SODIUM 40 MG/0.4ML ~~LOC~~ SOLN
40.0000 mg | SUBCUTANEOUS | Status: DC
  Administered 2017-07-19 – 2017-07-22 (×4): 40 mg via SUBCUTANEOUS
  Filled 2017-07-19 (×3): qty 0.4

## 2017-07-19 MED ORDER — IPRATROPIUM-ALBUTEROL 0.5-2.5 (3) MG/3ML IN SOLN
RESPIRATORY_TRACT | Status: AC
  Administered 2017-07-19: 3 mL via RESPIRATORY_TRACT
  Filled 2017-07-19: qty 3

## 2017-07-19 MED ORDER — ONDANSETRON HCL 4 MG PO TABS: 4.0000 mg | ORAL_TABLET | Freq: Four times a day (QID) | ORAL | Status: DC | PRN

## 2017-07-19 MED ORDER — METHYLPREDNISOLONE SODIUM SUCC 40 MG IJ SOLR
40.0000 mg | Freq: Every day | INTRAMUSCULAR | Status: DC
  Administered 2017-07-20: 09:00:00 40 mg via INTRAVENOUS
  Filled 2017-07-19: qty 1

## 2017-07-19 MED ORDER — ALPRAZOLAM 0.5 MG PO TABS
0.2500 mg | ORAL_TABLET | Freq: Three times a day (TID) | ORAL | Status: DC | PRN
  Administered 2017-07-19 – 2017-07-20 (×3): 0.25 mg via ORAL
  Filled 2017-07-19 (×3): qty 1

## 2017-07-19 MED ORDER — METFORMIN HCL 500 MG PO TABS
500.0000 mg | ORAL_TABLET | Freq: Two times a day (BID) | ORAL | Status: DC
  Administered 2017-07-20: 09:00:00 500 mg via ORAL
  Filled 2017-07-19: qty 1

## 2017-07-19 MED ORDER — LISINOPRIL 10 MG PO TABS
10.0000 mg | ORAL_TABLET | Freq: Every day | ORAL | Status: DC
  Administered 2017-07-19 – 2017-07-21 (×3): 10 mg via ORAL
  Filled 2017-07-19 (×3): qty 1

## 2017-07-19 MED ORDER — ASPIRIN EC 325 MG PO TBEC
DELAYED_RELEASE_TABLET | ORAL | Status: AC
Start: 2017-07-19 — End: 2017-07-19
  Administered 2017-07-19: 325 mg via ORAL
  Filled 2017-07-19: qty 1

## 2017-07-19 NOTE — ED Notes (Signed)
Admitting MD at bedside.

## 2017-07-19 NOTE — ED Notes (Signed)
EDP at bedside  

## 2017-07-19 NOTE — Consult Note (Signed)
Kyle Rich, Kyle Rich [id number] [birth year] Riley Nearing, MD  Reason for Consult: Hoarseness, shortness of breath  Requesting Physician: Loletha Grayer, MD   Consulting Physician: Riley Nearing  HPI: This 61 y.o. year old male was admitted on 07/19/2017 for COPD exacerbation ([place]) [J44.1] Elevated troponin I level [R74.8].  Dr. Leslye Peer consulted me because of concerns of hoarseness and a congested sound in the patient's throat, and wanted me to evaluate the larynx to make sure there were no obstructive lesions or other contributing factors to his shortness of breath.  The patient does note he has been hoarse for a couple of weeks but denies any recurrent hoarseness prior to that.  He has a long history of smoking and he says he just quit a week ago.  He has recently had issues with COPD exacerbations requiring hospitalization.  Associated with this he has been coughing quite a bit.  He also has a history of reflux and takes a medication for reflux every night although this is not listed in his current list of medications.  Recent chest CT showed no evidence of pulmonary embolus or mass in the lungs or mediastinum.  He had changes of emphysema noted.  Allergies: No Known Allergies  Medications:  Medications Prior to Admission  Medication Sig Dispense Refill  . aspirin EC 325 MG EC tablet Take 1 tablet (325 mg total) by mouth daily. 30 tablet 0  . atorvastatin (LIPITOR) 40 MG tablet Take 1 tablet (40 mg total) by mouth daily at 6 PM.    . Fluticasone-Salmeterol (ADVAIR DISKUS) 500-50 MCG/DOSE AEPB Inhale 1 puff every 12 (twelve) hours into the lungs.    . hydrochlorothiazide (HYDRODIURIL) 25 MG tablet Take 12.5 mg by mouth daily.    Marland Kitchen lisinopril (PRINIVIL,ZESTRIL) 10 MG tablet Take 10 mg by mouth daily.    . metFORMIN (GLUCOPHAGE) 500 MG tablet Take by mouth 2 (two) times daily with a meal.    . metoprolol tartrate (LOPRESSOR) 25 MG tablet Take 1 tablet (25 mg total) by mouth 2 (two) times daily.     . potassium chloride SA (K-DUR,KLOR-CON) 20 MEQ tablet Take 20 mEq by mouth daily.    . predniSONE (STERAPRED UNI-PAK 21 TAB) 10 MG (21) TBPK tablet 60 mg day 1 and taper 10 mg daily 21 tablet 0  . SUBOXONE 8-2 MG FILM Place 1 Film (8 mg total) under the tongue 2 (two) times daily NADEAN [id number]  0  . tiotropium (SPIRIVA HANDIHALER) 18 MCG inhalation capsule Place 1 capsule daily into inhaler and inhale.    . valACYclovir (VALTREX) 1000 MG tablet Take 1 tablet 3 (three) times daily by mouth.    Marland Kitchen albuterol (PROAIR HFA) 108 (90 BASE) MCG/ACT inhaler Inhale 2 puffs into the lungs every 6 (six) hours as needed for wheezing.     Marland Kitchen albuterol (PROVENTIL) (2.5 MG/3ML) 0.083% nebulizer solution Inhale 3 mLs into the lungs every 4 (four) hours as needed for wheezing.     .  Current Facility-Administered Medications  Medication Dose Route Frequency Provider Last Rate Last Dose  . acetaminophen (TYLENOL) tablet 650 mg  650 mg Oral Q6H PRN Loletha Grayer, MD       Or  . acetaminophen (TYLENOL) suppository 650 mg  650 mg Rectal Q6H PRN Wieting, Richard, MD      . acetylcysteine (MUCOMYST) 20 % nebulizer / oral solution 3 mL  3 mL Nebulization BID Loletha Grayer, MD   3 mL at 07/19/17 1413  . ALPRAZolam Duanne Moron) tablet  0.25 mg  0.25 mg Oral TID PRN Loletha Grayer, MD   0.25 mg at 07/19/17 1444  . aspirin EC tablet 325 mg  325 mg Oral Daily Loletha Grayer, MD   325 mg at 07/19/17 1132  . atorvastatin (LIPITOR) tablet 40 mg  40 mg Oral q1800 Wieting, Richard, MD      . budesonide (PULMICORT) 0.25 MG/2ML nebulizer solution           . budesonide (PULMICORT) nebulizer solution 0.5 mg  0.5 mg Nebulization BID Loletha Grayer, MD   0.5 mg at 07/19/17 1133  . buprenorphine-naloxone (SUBOXONE) 8-2 mg per SL tablet 1 tablet  1 tablet Sublingual BID Wieting, Richard, MD      . enoxaparin (LOVENOX) 40 MG/0.4ML injection           . enoxaparin (LOVENOX) injection 40 mg  40 mg Subcutaneous Q24H Loletha Grayer, MD   40 mg at 07/19/17 1412  . escitalopram (LEXAPRO) tablet 10 mg  10 mg Oral QHS Wieting, Richard, MD      . insulin aspart (novoLOG) injection 0-15 Units  0-15 Units Subcutaneous TID WC Loletha Grayer, MD   3 Units at 07/19/17 1412  . insulin aspart (novoLOG) injection 0-5 Units  0-5 Units Subcutaneous QHS Wieting, Richard, MD      . ipratropium-albuterol (DUONEB) 0.5-2.5 (3) MG/3ML nebulizer solution 3 mL  3 mL Nebulization Q6H Wieting, Richard, MD   3 mL at 07/19/17 1412  . lisinopril (PRINIVIL,ZESTRIL) tablet 10 mg  10 mg Oral Daily Loletha Grayer, MD   10 mg at 07/19/17 1131  . metFORMIN (GLUCOPHAGE) tablet 500 mg  500 mg Oral BID WC Loletha Grayer, MD      . Derrill Memo ON 07/20/2017] methylPREDNISolone sodium succinate (SOLU-MEDROL) 40 mg/mL injection 40 mg  40 mg Intravenous Daily Wieting, Richard, MD      . metoprolol tartrate (LOPRESSOR) tablet 25 mg  25 mg Oral BID Loletha Grayer, MD   25 mg at 07/19/17 1132  . ondansetron (ZOFRAN) tablet 4 mg  4 mg Oral Q6H PRN Wieting, Richard, MD       Or  . ondansetron (ZOFRAN) injection 4 mg  4 mg Intravenous Q6H PRN Loletha Grayer, MD        PMH:  Past Medical History:  Diagnosis Date  . Arthritis   . Asthma   . CHF (congestive heart failure) ([place])   . COPD (chronic obstructive pulmonary disease) ([place])   . Diabetes mellitus without complication ([place])   . Hepatitis C   . Hypertension     Fam Hx:  Family History  Problem Relation Age of Onset  . Bladder Cancer Mother   . Peripheral vascular disease Mother   . Lung cancer Father     Soc Hx:  Social History   Socioeconomic History  . Marital status: Single    Spouse name: Not on file  . Number of children: Not on file  . Years of education: Not on file  . Highest education level: Not on file  Social Needs  . Financial resource strain: Not on file  . Food insecurity - worry: Not on file  . Food insecurity - inability: Not on file  . Transportation needs -  medical: Not on file  . Transportation needs - non-medical: Not on file  Occupational History  . Not on file  Tobacco Use  . Smoking status: Current Every Day Smoker    Packs/day: 0.50    Years: 40.00    Pack  years: 20.00    Types: Cigarettes  . Smokeless tobacco: Never Used  Substance and Sexual Activity  . Alcohol use: Yes    Alcohol/week: 0.6 oz    Types: 1 Cans of beer per week  . Drug use: No  . Sexual activity: Not on file  Other Topics Concern  . Not on file  Social History Narrative  . Not on file    PSH: History reviewed. No pertinent surgical history.. Procedures since admission: No admission procedures for hospital encounter.  ROS: Review of systems normal other than 12 systems except per HPI.  PHYSICAL EXAM Vitals:  Vitals:   07/19/17 1430 07/19/17 1658  BP: (!) 163/60 (!) 170/79  Pulse: 72 79  Resp:    Temp:  97.9 F (36.6 C)  SpO2: 98% 97%  . General: Well-developed, Well-nourished in no acute distress Mood: Mood and affect well adjusted, pleasant and cooperative. Orientation: Grossly alert and oriented. Vocal Quality: The patient has mild hoarseness without any stridor. Communicates verbally. head and Face: NCAT. No facial asymmetry. No visible skin lesions. No significant facial scars. No tenderness with sinus percussion. Facial strength normal and symmetric. Ears: External ears with normal landmarks, no lesions. External auditory canals free of infection, cerumen impaction or lesions. Tympanic membranes intact with good landmarks and normal mobility on pneumatic otoscopy. No middle ear effusion. Hearing: Speech reception grossly normal. Nose: External nose normal with midline dorsum and no lesions or deformity. Nasal Cavity reveals mildly right deviated septum with normal inferior turbinates. No significant mucosal congestion or erythema. Nasal secretions are minimal and clear. No polyps seen on anterior rhinoscopy. Oral Cavity/ Oropharynx: Lips are  normal with no lesions.  He is edentulous. Gingiva healthy with no lesions or gingivitis. Oropharynx including tongue, buccal mucosa, floor of mouth, hard and soft palate, uvula and posterior pharynx free of exudates, erythema or lesions with normal symmetry with slightly dry mucous membranes.  Indirect Laryngoscopy/Nasopharyngoscopy: Visualization of the larynx, hypopharynx and nasopharynx is not possible in this setting with routine examination. Neck: Supple and symmetric with no palpable masses, tenderness or crepitance. The trachea is midline. Thyroid gland is soft, nontender and symmetric with no masses or enlargement. Parotid and submandibular glands are soft, nontender and symmetric, without masses. Lymphatic: Cervical lymph nodes are without palpable lymphadenopathy or tenderness. Respiratory: Normal respiratory effort without labored breathing. Cardiovascular: Carotid pulse shows regular rate and rhythm Neurologic: Cranial Nerves II through XII are grossly intact. Eyes: Gaze and Ocular Motility are grossly normal. PERRLA. No visible nystagmus.  MEDICAL DECISION MAKING: Data Review:  Results for orders placed or performed during the hospital encounter of 07/19/17 (from the past 48 hour(s))  Basic metabolic panel     Status: Abnormal   Collection Time: 07/19/17  8:26 AM  Result Value Ref Range   Sodium 138 135 - 145 mmol/L   Potassium 3.7 3.5 - 5.1 mmol/L   Chloride 96 (L) 101 - 111 mmol/L   CO2 34 (H) 22 - 32 mmol/L   Glucose, Bld 176 (H) 65 - 99 mg/dL   BUN 21 (H) 6 - 20 mg/dL   Creatinine, Ser 0.97 0.61 - 1.24 mg/dL   Calcium 8.7 (L) 8.9 - 10.3 mg/dL   GFR calc non Af Amer >60 >60 mL/min   GFR calc Af Amer >60 >60 mL/min    Comment: (NOTE) The eGFR has been calculated using the CKD EPI equation. This calculation has not been validated in all clinical situations. eGFR's persistently <60 mL/min signify possible  Chronic Kidney Disease.    Anion gap 8 5 - 15  CBC     Status:  Abnormal   Collection Time: 07/19/17  8:26 AM  Result Value Ref Range   WBC 11.2 (H) 3.8 - 10.6 K/uL   RBC 4.30 (L) 4.40 - 5.90 MIL/uL   Hemoglobin 12.8 (L) 13.0 - 18.0 g/dL   HCT 40.0 40.0 - 52.0 %   MCV 93.0 80.0 - 100.0 fL   MCH 29.7 26.0 - 34.0 pg   MCHC 32.0 32.0 - 36.0 g/dL   RDW 14.2 11.5 - 14.5 %   Platelets 258 150 - 440 K/uL  Brain natriuretic peptide     Status: Abnormal   Collection Time: 07/19/17  8:26 AM  Result Value Ref Range   B Natriuretic Peptide 246.0 (H) 0.0 - 100.0 pg/mL  Troponin I     Status: Abnormal   Collection Time: 07/19/17  8:26 AM  Result Value Ref Range   Troponin I 0.08 (HH) <0.03 ng/mL    Comment: CRITICAL RESULT CALLED TO, READ BACK BY AND VERIFIED WITH OLIVIA BROOMER AT 0930 ON 07/19/17 [place].   Rapid HIV screen (HIV 1/2 Ab+Ag)     Status: None   Collection Time: 07/19/17  8:26 AM  Result Value Ref Range   HIV-1 P24 Antigen - HIV24 NON REACTIVE NON REACTIVE   HIV 1/2 Antibodies NON REACTIVE NON REACTIVE   Interpretation (HIV Ag Ab)      A non reactive test result means that HIV 1 or HIV 2 antibodies and HIV 1 p24 antigen were not detected in the specimen.  Blood gas, venous     Status: Abnormal (Preliminary result)   Collection Time: 07/19/17  8:35 AM  Result Value Ref Range   FIO2 PENDING    Delivery systems PENDING    pH, Ven 7.40 7.250 - 7.430   pCO2, Ven 65 (H) 44.0 - 60.0 mmHg   pO2, Ven 83.0 (H) 32.0 - 45.0 mmHg   Bicarbonate 40.3 (H) 20.0 - 28.0 mmol/L   Acid-Base Excess 12.6 (H) 0.0 - 2.0 mmol/L   O2 Saturation 96.1 %   Patient temperature 37.0    Collection site VEIN    Sample type VEIN   Procalcitonin - Baseline     Status: None   Collection Time: 07/19/17 12:36 PM  Result Value Ref Range   Procalcitonin <0.10 ng/mL    Comment:        Interpretation: PCT (Procalcitonin) <= 0.5 ng/mL: Systemic infection (sepsis) is not likely. Local bacterial infection is possible. (NOTE)         ICU PCT Algorithm               Non ICU  PCT Algorithm    ----------------------------     ------------------------------         PCT < 0.25 ng/mL                 PCT < 0.1 ng/mL     Stopping of antibiotics            Stopping of antibiotics       strongly encouraged.               strongly encouraged.    ----------------------------     ------------------------------       PCT level decrease by               PCT < 0.25 ng/mL       >= 80%  from peak PCT       OR PCT 0.25 - 0.5 ng/mL          Stopping of antibiotics                                             encouraged.     Stopping of antibiotics           encouraged.    ----------------------------     ------------------------------       PCT level decrease by              PCT >= 0.25 ng/mL       < 80% from peak PCT        AND PCT >= 0.5 ng/mL            Continuin g antibiotics                                              encouraged.       Continuing antibiotics            encouraged.    ----------------------------     ------------------------------     PCT level increase compared          PCT > 0.5 ng/mL         with peak PCT AND          PCT >= 0.5 ng/mL             Escalation of antibiotics                                          strongly encouraged.      Escalation of antibiotics        strongly encouraged.   Troponin I     Status: Abnormal   Collection Time: 07/19/17 12:36 PM  Result Value Ref Range   Troponin I 0.08 (HH) <0.03 ng/mL    Comment: CRITICAL VALUE NOTED. VALUE IS CONSISTENT WITH PREVIOUSLY REPORTED/CALLED VALUE.MSS  Influenza panel by PCR (type A & B)     Status: None   Collection Time: 07/19/17 12:50 PM  Result Value Ref Range   Influenza A By PCR NEGATIVE NEGATIVE   Influenza B By PCR NEGATIVE NEGATIVE    Comment: (NOTE) The Xpert Xpress Flu assay is intended as an aid in the diagnosis of  influenza and should not be used as a sole basis for treatment.  This  assay is FDA approved for nasopharyngeal swab specimens only. Nasal  washings and  aspirates are unacceptable for Xpert Xpress Flu testing.   Glucose, capillary     Status: Abnormal   Collection Time: 07/19/17  1:26 PM  Result Value Ref Range   Glucose-Capillary 175 (H) 65 - 99 mg/dL  . Dg Chest Port 1 View  Result Date: 07/19/2017 CLINICAL DATA:  Increased shortness of breath over the past week. EXAM: PORTABLE CHEST 1 VIEW COMPARISON:  Chest radiograph 07/02/2017 and CT 07/03/2017 FINDINGS: The cardiomediastinal silhouette is unchanged and within normal limits. Aortic atherosclerosis is noted. Chronic interstitial coarsening is similar to the prior radiograph. There is no evidence of acute airspace  consolidation, overt edema, sizable pleural effusion, or pneumothorax. No acute osseous abnormality is identified. IMPRESSION: COPD without evidence of acute cardiopulmonary process. Electronically Signed   By: Logan Bores M.D.   On: 07/19/2017 09:05  .   PROCEDURE: Procedure: Diagnostic Fiberoptic Nasolaryngoscopy Preop Diagnosis: Hoarseness, shortness of breath Postop Diagnosis: Hoarseness, shortness of breath, acute laryngitis with laryngopharyngeal reflux Indications: Patient with a long history of smoking and recent onset of hoarseness and shortness of breath Findings: The hypopharynx larynx and tongue base are free of any mucosal lesions.  Vocal cords are mobile with mild erythema and edema but no leukoplakia or polyps or obstructive lesions.  The supraglottic larynx is free of any obstructive lesions.  There is pachyderma of the posterior glottis typical of laryngopharyngeal reflux. Description of Procedure: After discussing procedure and risks  (primarily nose bleed) with the patient, the nose was anesthetized on the left with topical Lidocaine 4% and decongested with phenylephrine. A flexible fiberoptic scope was passed through the left nasal cavity. The nasal cavity was inspected and the scope passed through the Nasopharynx to the region of the hypopharynx and larynx. The  patient was instructed to phonate to assess vocal cord mobility. The tongue was extended to evaluate the tongue base completely. Valsalva was performed to insufflate the hypopharynx for improved examination. Findings are as noted above. The scope was withdrawn. The patient tolerated the procedure well.  ASSESSMENT: This patient has findings of acute laryngitis from recent coughing superimposed on chronic changes of laryngopharyngeal reflux.  There is no evidence of any obstructive lesions which might contribute to shortness of breath.  PLAN: I would recommend reflux management, ideally with a PPI such as omeprazole.  It is not clear what he is taking at home.  I typically recommend patients with laryngal pharyngeal reflux be dosed 30-60 minutes prior to their evening meal.  Reflux precautions should be also stressed.  I do not have any specific recommendations for managing the acute laryngitis other than controlling his underlying pulmonary condition.  Steroids may be useful in reducing minor swelling and inflammation of the cords, and would be appropriate if considered beneficial for his overall clinical picture.  Dehydration and use of Mucinex to break up secretions and an antitussive would also be appropriate.  But I see no evidence of acute bacterial pharyngitis or thrush.   Riley Nearing, MD 07/19/2017 5:20 PM

## 2017-07-19 NOTE — H&P (Signed)
[facility] - [place] at [facility]   PATIENT NAME: Kyle GuarneriJames Rich    MR#:  [id number]  DATE OF BIRTH:  11-23-1955  DATE OF ADMISSION:  07/19/2017  PRIMARY CARE PHYSICIAN: [name], Sionne A, MD   REQUESTING/REFERRING PHYSICIAN: Dr Presley RaddleJohnathan Williams  CHIEF COMPLAINT:   Chief Complaint  Patient presents with  . Shortness of Breath    HISTORY OF PRESENT ILLNESS:  Kyle Rich  is a 61 y.o. male with a known history of COPD on chronic oxygen presents back to the hospital with shortness of breath wheezing cough.  He states he panics and he is scared that he is going to die.  If he is near his nebulizer he can calm down for a few minutes and eventually goes away.  If he is not by his nebulizer he is in trouble and he cannot breathe.  He has been coughing but is nonproductive.  A few weeks is been tight in his chest.  He was just recently in the hospital for a long period of time for similar episode.  PAST MEDICAL HISTORY:   Past Medical History:  Diagnosis Date  . Arthritis   . Asthma   . CHF (congestive heart failure) (HCC)   . COPD (chronic obstructive pulmonary disease) (HCC)   . Diabetes mellitus without complication (HCC)   . Hepatitis C   . Hypertension     PAST SURGICAL HISTORY:  History reviewed. No pertinent surgical history.  SOCIAL HISTORY:   Social History   Tobacco Use  . Smoking status: Current Every Day Smoker    Packs/day: 0.50    Years: 40.00    Pack years: 20.00    Types: Cigarettes  . Smokeless tobacco: Never Used  Substance Use Topics  . Alcohol use: Yes    Alcohol/week: 0.6 oz    Types: 1 Cans of beer per week    FAMILY HISTORY:   Family History  Problem Relation Age of Onset  . Bladder Cancer Mother   . Peripheral vascular disease Mother   . Lung cancer Father     DRUG ALLERGIES:  No Known Allergies  REVIEW OF SYSTEMS:  CONSTITUTIONAL: No fever.  Positive for sweats.  Positive for fatigue and weakness.  EYES:  No blurred or double vision.  EARS, NOSE, AND THROAT: No tinnitus or ear pain.  Positive for hoarse voice.  Positive for dysphasia to liquids and solids.  Positive for sore throat and runny nose RESPIRATORY: Positive for cough, shortness of breath, and wheezing.  No hemoptysis.  CARDIOVASCULAR: Positive for chest tightness.  No orthopnea, edema.  GASTROINTESTINAL: No nausea, vomiting, diarrhea.  Pain left upper abdominal pain. No blood in bowel movements GENITOURINARY: No dysuria, hematuria.  ENDOCRINE: No polyuria, nocturia,  HEMATOLOGY: No anemia, easy bruising or bleeding SKIN: No rash or lesion. MUSCULOSKELETAL: No joint pain or arthritis.   NEUROLOGIC: No tingling, numbness, weakness.  PSYCHIATRY: Positive for anxiety and depression.   MEDICATIONS AT HOME:   Prior to Admission medications   Medication Sig Start Date End Date Taking? Authorizing Provider  aspirin EC 325 MG EC tablet Take 1 tablet (325 mg total) by mouth daily. 06/16/16  Yes Houston SirenSainani, Vivek J, MD  atorvastatin (LIPITOR) 40 MG tablet Take 1 tablet (40 mg total) by mouth daily at 6 PM. 06/16/16  Yes Sainani, Rolly PancakeVivek J, MD  Fluticasone-Salmeterol (ADVAIR DISKUS) 500-50 MCG/DOSE AEPB Inhale 1 puff every 12 (twelve) hours into the lungs. 07/13/17 07/13/18 Yes [provider]  hydrochlorothiazide (HYDRODIURIL) 25  MG tablet Take 12.5 mg by mouth daily.   Yes [provider]  lisinopril (PRINIVIL,ZESTRIL) 10 MG tablet Take 10 mg by mouth daily.   Yes [provider]  metFORMIN (GLUCOPHAGE) 500 MG tablet Take by mouth 2 (two) times daily with a meal.   Yes [provider]  metoprolol tartrate (LOPRESSOR) 25 MG tablet Take 1 tablet (25 mg total) by mouth 2 (two) times daily. 06/15/16  Yes Sainani, Rolly PancakeVivek J, MD  potassium chloride SA (K-DUR,KLOR-CON) 20 MEQ tablet Take 20 mEq by mouth daily.   Yes [provider]  predniSONE (STERAPRED UNI-PAK 21 TAB) 10 MG (21) TBPK tablet 60 mg day 1 and  taper 10 mg daily 07/08/17  Yes Sudini, Srikar, MD  SUBOXONE 8-2 MG FILM Place 1 Film (8 mg total) under the tongue 2 (two) times daily Marcille BuffyNADEAN [id number] 06/20/17  Yes [provider]  tiotropium (SPIRIVA HANDIHALER) 18 MCG inhalation capsule Place 1 capsule daily into inhaler and inhale. 07/13/17 07/13/18 Yes [provider]  valACYclovir (VALTREX) 1000 MG tablet Take 1 tablet 3 (three) times daily by mouth. 07/13/17 07/20/17 Yes [provider]  albuterol (PROAIR HFA) 108 (90 BASE) MCG/ACT inhaler Inhale 2 puffs into the lungs every 6 (six) hours as needed for wheezing.     [provider]  albuterol (PROVENTIL) (2.5 MG/3ML) 0.083% nebulizer solution Inhale 3 mLs into the lungs every 4 (four) hours as needed for wheezing.     [provider]      VITAL SIGNS:  Blood pressure (!) 156/73, pulse 83, temperature 98.5 F (36.9 C), temperature source Oral, resp. rate 17, height 5\' 4"  (1.626 m), weight 84.8 kg (187 lb), SpO2 98 %.  PHYSICAL EXAMINATION:  GENERAL:  61 y.o.-year-old patient lying in the bed with no acute distress.  EYES: Pupils equal, round, reactive to light and accommodation. No scleral icterus. Extraocular muscles intact.  HEENT: Head atraumatic, normocephalic. Oropharynx and nasopharynx clear.  NECK:  Supple, no jugular venous distention. No thyroid enlargement, no tenderness.  LUNGS: Decreased breath sounds bilaterally, positive for upper airway wheezing, positive for wheezing in the lungs but unclear if is transmitted from upper airway or not. No rales,rhonchi or crepitation. No use of accessory muscles of respiration.  CARDIOVASCULAR: S1, S2 normal. No murmurs, rubs, or gallops.  ABDOMEN: Soft, nontender, nondistended. Bowel sounds present. No organomegaly or mass.  EXTREMITIES: No pedal edema, cyanosis, or clubbing.  NEUROLOGIC: Cranial nerves II through XII are intact. Muscle strength 5/5 in all extremities. Sensation intact. Gait not  checked.  PSYCHIATRIC: The patient is alert and oriented x 3.  SKIN: No rash, lesion, or ulcer.   LABORATORY PANEL:   CBC Recent Labs  Lab 07/19/17 0826  WBC 11.2*  HGB 12.8*  HCT 40.0  PLT 258   ------------------------------------------------------------------------------------------------------------------  Chemistries  Recent Labs  Lab 07/19/17 0826  NA 138  K 3.7  CL 96*  CO2 34*  GLUCOSE 176*  BUN 21*  CREATININE 0.97  CALCIUM 8.7*   ------------------------------------------------------------------------------------------------------------------  Cardiac Enzymes Recent Labs  Lab 07/19/17 0826  TROPONINI 0.08*   ------------------------------------------------------------------------------------------------------------------  RADIOLOGY:  Dg Chest Port 1 View  Result Date: 07/19/2017 CLINICAL DATA:  Increased shortness of breath over the past week. EXAM: PORTABLE CHEST 1 VIEW COMPARISON:  Chest radiograph 07/02/2017 and CT 07/03/2017 FINDINGS: The cardiomediastinal silhouette is unchanged and within normal limits. Aortic atherosclerosis is noted. Chronic interstitial coarsening is similar to the prior radiograph. There is no evidence of acute airspace consolidation,  overt edema, sizable pleural effusion, or pneumothorax. No acute osseous abnormality is identified. IMPRESSION: COPD without evidence of acute cardiopulmonary process. Electronically Signed   By: Sebastian Ache M.D.   On: 07/19/2017 09:05    EKG:   Normal sinus rhythm 90 bpm nonspecific ST-T wave changes  IMPRESSION AND PLAN:   1.  COPD exacerbation.  Continue IV Solu-Medrol 40 mg daily.  Start budesonide nebulizers and Mucomyst nebulizers.  Also DuoNeb nebulizer solution.  Send off a pro-calcitonin, respiratory panel and influenza.  Patient given a dose of Levaquin in the ER.  If pro-calcitonin is negative I will not continue this. 2.  Upper airway wheeze.  ENT consult to see if there is a vocal  cord issue.  Case discussed with Dr. Willeen Cass ENT to see the patient. 3.  Chronic respiratory failure.  Continue oxygen 3 L nasal cannula 4.  Anxiety depression.  Start Lexapro and Xanax.  This could be part of the patient's issue. 5.  History of triple-vessel disease on last cardiac catheterization last year.  Borderline troponin.  Serial troponins monitor on telemetry.  With the patient's COPD he was not a candidate for bypass last time. 6.  Essential hypertension continue his usual medications 7.  Tobacco abuse.  Patient states he has not smoked since last hospitalization.  Smoking cessation counseling done 4 minutes by me. 8.  Type 2 diabetes mellitus on Glucophage.  Also put on sliding scale 9.  No signs of congestive heart failure 10.  History of hepatitis C   All the records are reviewed and case discussed with ED provider. Management plans discussed with the patient, family and they are in agreement.  CODE STATUS: Full code  TOTAL TIME TAKING CARE OF THIS PATIENT: 50 minutes.    Alford Highland M.D on 07/19/2017 at 10:38 AM  Between 7am to 6pm - Pager - [phone]  After 6pm call admission pager [phone]  [facility]  [phone]  CC: Primary care physician; Rayetta Humphrey, MD

## 2017-07-19 NOTE — ED Notes (Signed)
Pt resting in bed, assisted to use bathroom, denies any needs

## 2017-07-19 NOTE — ED Notes (Signed)
Lab called for flu swabs 

## 2017-07-19 NOTE — ED Triage Notes (Signed)
Pt c/o increased SOB over the past week , pt is on continuous 2L [place], but increased to 3L [place] due to SOB. Pt is tachypneic on arrival with audible wheezing noted in triage..Marland Kitchen

## 2017-07-19 NOTE — ED Provider Notes (Addendum)
[facility] Emergency Department Provider Note       Time seen: ----------------------------------------- 8:32 AM on 07/19/2017 -----------------------------------------    I have reviewed the triage vital signs and the nursing notes.  HISTORY   Chief Complaint Shortness of Breath    HPI Kyle Rich is a 61 y.o. male with a history of CHF and COPD who presents to the ED for shortness of breath.  Patient reports increased shortness of breath over the past week.  He typically is on 3 L of oxygen all the time.  He presents tachypneic with wheezing.  He states he took a DuoNeb prior to arrival and he was given 1 here on arrival.  He was just admitted to the hospital 2 weeks ago and reports his symptoms are much worse when he walks.  Past Medical History:  Diagnosis Date  . Arthritis   . Asthma   . CHF (congestive heart failure) (HCC)   . COPD (chronic obstructive pulmonary disease) (HCC)   . Diabetes mellitus without complication (HCC)   . Hepatitis C   . Hypertension     Patient Active Problem List   Diagnosis Date Noted  . NSTEMI (non-ST elevated myocardial infarction) (HCC) 06/15/2016  . COPD exacerbation (HCC) 06/09/2015    History reviewed. No pertinent surgical history.  Allergies Patient has no known allergies.  Social History Social History   Tobacco Use  . Smoking status: Current Every Day Smoker    Packs/day: 0.50    Years: 40.00    Pack years: 20.00    Types: Cigarettes  . Smokeless tobacco: Never Used  Substance Use Topics  . Alcohol use: Yes    Alcohol/week: 0.6 oz    Types: 1 Cans of beer per week  . Drug use: No    Review of Systems Constitutional: Negative for fever. Cardiovascular: Negative for chest pain. Respiratory: Positive for shortness of breath Gastrointestinal: Negative for abdominal pain, vomiting and diarrhea. Genitourinary: Negative for dysuria. Musculoskeletal: Negative for back pain. Skin: Negative  for rash. Neurological: Negative for headaches, focal weakness or numbness.  All systems negative/normal/unremarkable except as stated in the HPI  ____________________________________________   PHYSICAL EXAM:  VITAL SIGNS: ED Triage Vitals  Enc Vitals Group     BP 07/19/17 0816 (!) 167/68     Pulse Rate 07/19/17 0813 (!) 107     Resp 07/19/17 0813 (!) 28     Temp 07/19/17 0813 98.5 F (36.9 C)     Temp Source 07/19/17 0813 Oral     SpO2 07/19/17 0813 98 %     Weight 07/19/17 0813 187 lb (84.8 kg)     Height 07/19/17 0813 5\' 4"  (1.626 m)     Head Circumference --      Peak Flow --      Pain Score --      Pain Loc --      Pain Edu? --      Excl. in GC? --     Constitutional: Alert and oriented.  Mild distress Eyes: Conjunctivae are normal. Normal extraocular movements. ENT   Head: Normocephalic and atraumatic.   Nose: No congestion/rhinnorhea.   Mouth/Throat: Mucous membranes are moist.   Neck: No stridor. Cardiovascular: Normal rate, regular rhythm. No murmurs, rubs, or gallops. Respiratory: Tachypnea with wheezing bilaterally Gastrointestinal: Soft and nontender. Normal bowel sounds Musculoskeletal: Nontender with normal range of motion in extremities. No lower extremity tenderness nor edema. Neurologic:  Normal speech and language. No gross focal neurologic  deficits are appreciated.  Skin:  Skin is warm, dry and intact. No rash noted. Psychiatric: Mood and affect are normal. Speech and behavior are normal.  ____________________________________________  EKG: Interpreted by me.  Sinus rhythm rate of 90 bpm, normal PR interval, normal QRS, normal QT.  ST depressions are noted laterally  ____________________________________________  ED COURSE:  Pertinent labs & imaging results that were available during my care of the patient were reviewed by me and considered in my medical decision making (see chart for details). Patient presents for shortness of breath  and wheezing, we will assess with labs and imaging as indicated.   Procedures ____________________________________________   LABS (pertinent positives/negatives)  Labs Reviewed  BASIC METABOLIC PANEL - Abnormal; Notable for the following components:      Result Value   Chloride 96 (*)    CO2 34 (*)    Glucose, Bld 176 (*)    BUN 21 (*)    Calcium 8.7 (*)    All other components within normal limits  CBC - Abnormal; Notable for the following components:   WBC 11.2 (*)    RBC 4.30 (*)    Hemoglobin 12.8 (*)    All other components within normal limits  BRAIN NATRIURETIC PEPTIDE - Abnormal; Notable for the following components:   B Natriuretic Peptide 246.0 (*)    All other components within normal limits  TROPONIN I - Abnormal; Notable for the following components:   Troponin I 0.08 (*)    All other components within normal limits  BLOOD GAS, VENOUS - Abnormal; Notable for the following components:   pCO2, Ven 65 (*)    pO2, Ven 83.0 (*)    Bicarbonate 40.3 (*)    Acid-Base Excess 12.6 (*)    All other components within normal limits  RAPID HIV SCREEN (HIV 1/2 AB+AG)   CRITICAL CARE Performed by: Emily FilbertWilliams, Jonathan E   Total critical care time: 30 minutes  Critical care time was exclusive of separately billable procedures and treating other patients.  Critical care was necessary to treat or prevent imminent or life-threatening deterioration.  Critical care was time spent personally by me on the following activities: development of treatment plan with patient and/or surrogate as well as nursing, discussions with consultants, evaluation of patient's response to treatment, examination of patient, obtaining history from patient or surrogate, ordering and performing treatments and interventions, ordering and review of laboratory studies, ordering and review of radiographic studies, pulse oximetry and re-evaluation of patient's condition.  RADIOLOGY  Chest x-ray Reveals COPD  without any other acute process ____________________________________________  DIFFERENTIAL DIAGNOSIS   COPD, CHF, pneumonia, pneumothorax, PE, PCP pneumonia  FINAL ASSESSMENT AND PLAN  COPD exacerbation, elevated troponin   Plan: Patient had presented for shortness of breath that is worsened over the last week particularly with exertion. Patient's labs did reveal elevated troponin therefore the etiology of this could be multifactorial.  Clinically he does have a COPD exacerbation. Patient's imaging was negative for any acute process other than COPD.  He has improved somewhat duo nebs and steroids but will need to continue with this in the hospital as well as have a more thorough cardiac workup.   Emily FilbertWilliams, Jonathan E, MD   Note: This note was generated in part or whole with voice recognition software. Voice recognition is usually quite accurate but there are transcription errors that can and very often do occur. I apologize for any typographical errors that were not detected and corrected.     Mayford KnifeWilliams,  Cecille AmsterdamJonathan E, MD 07/19/17 [id number]    Emily FilbertWilliams, Jonathan E, MD 07/19/17 [phone]

## 2017-07-19 NOTE — ED Notes (Signed)
Dr. Williams notified of critical trop 

## 2017-07-20 LAB — BASIC METABOLIC PANEL
Anion gap: 9 (ref 5–15)
BUN: 25 mg/dL — AB (ref 6–20)
CHLORIDE: 99 mmol/L — AB (ref 101–111)
CO2: 29 mmol/L (ref 22–32)
Calcium: 8.3 mg/dL — ABNORMAL LOW (ref 8.9–10.3)
Creatinine, Ser: 0.76 mg/dL (ref 0.61–1.24)
GFR calc Af Amer: >60 mL/min (ref >60)
GFR calc non Af Amer: >60 mL/min (ref >60)
Glucose, Bld: 110 mg/dL — ABNORMAL HIGH (ref 65–99)
Potassium: 4 mmol/L (ref 3.5–5.1)
Sodium: 137 mmol/L (ref 135–145)

## 2017-07-20 LAB — GLUCOSE, CAPILLARY
GLUCOSE-CAPILLARY: 133 mg/dL — AB (ref 65–99)
Glucose-Capillary: 144 mg/dL — ABNORMAL HIGH (ref 65–99)
Glucose-Capillary: 209 mg/dL — ABNORMAL HIGH (ref 65–99)

## 2017-07-20 LAB — CBC
HCT: 41.4 % (ref 40.0–52.0)
Hemoglobin: 13.1 g/dL (ref 13.0–18.0)
MCH: 29.6 pg (ref 26.0–34.0)
MCHC: 31.7 g/dL — ABNORMAL LOW (ref 32.0–36.0)
MCV: 93.4 fL (ref 80.0–100.0)
Platelets: 241 10*3/uL (ref 150–440)
RBC: 4.43 MIL/uL (ref 4.40–5.90)
RDW: 14.6 % — AB (ref 11.5–14.5)
WBC: 16.1 10*3/uL — ABNORMAL HIGH (ref 3.8–10.6)

## 2017-07-20 LAB — PROCALCITONIN: Procalcitonin: <0.1 ng/mL

## 2017-07-20 MED ORDER — HYDROCHLOROTHIAZIDE 25 MG PO TABS
12.5000 mg | ORAL_TABLET | Freq: Every day | ORAL | Status: DC
  Administered 2017-07-20 – 2017-07-22 (×3): 12.5 mg via ORAL
  Filled 2017-07-20 (×4): qty 1

## 2017-07-20 MED ORDER — NICOTINE 14 MG/24HR TD PT24
14.0000 mg | MEDICATED_PATCH | Freq: Every day | TRANSDERMAL | Status: DC
  Administered 2017-07-21 – 2017-07-23 (×4): 14 mg via TRANSDERMAL
  Filled 2017-07-20 (×4): qty 1

## 2017-07-20 MED ORDER — PHENOL 1.4 % MT LIQD
1.0000 | OROMUCOSAL | Status: DC | PRN
  Filled 2017-07-20: qty 177

## 2017-07-20 MED ORDER — METHYLPREDNISOLONE SODIUM SUCC 40 MG IJ SOLR
40.0000 mg | Freq: Two times a day (BID) | INTRAMUSCULAR | Status: DC
  Administered 2017-07-20 – 2017-07-23 (×6): 40 mg via INTRAVENOUS
  Filled 2017-07-20 (×6): qty 1

## 2017-07-20 NOTE — Progress Notes (Addendum)
[facility] - [place] at [facility]   PATIENT NAME: Marnee GuarneriJames Semmel    MR#:  [id number]  DATE OF BIRTH:  1955-10-25  SUBJECTIVE:   Still with SOB and cough wheezing  REVIEW OF SYSTEMS:    Review of Systems  Constitutional: Negative for fever, chills weight loss HENT: Negative for ear pain, nosebleeds, congestion, facial swelling, rhinorrhea, neck pain, neck stiffness and ear discharge.   Respiratory: Positive for cough, shortness of breath, wheezing  Cardiovascular: Negative for chest pain, palpitations and leg swelling.  Gastrointestinal: Negative for heartburn, abdominal pain, vomiting, diarrhea or consitpation Genitourinary: Negative for dysuria, urgency, frequency, hematuria Musculoskeletal: Negative for back pain or joint pain Neurological: Negative for dizziness, seizures, syncope, focal weakness,  numbness and headaches.  Hematological: Does not bruise/bleed easily.  Psychiatric/Behavioral: Negative for hallucinations, confusion, dysphoric mood    Tolerating Diet: yes      DRUG ALLERGIES:  No Known Allergies  VITALS:  Blood pressure (!) 173/71, pulse 66, temperature 98 F (36.7 C), temperature source Oral, resp. rate 18, height 5\' 4"  (1.626 m), weight 85 kg (187 lb 5 oz), SpO2 94 %.  PHYSICAL EXAMINATION:  Constitutional: Appears well-developed and well-nourished. No distress. HENT: Normocephalic. Marland Kitchen. Oropharynx is clear and moist.  Eyes: Conjunctivae and EOM are normal. PERRLA, no scleral icterus.  Neck: Normal ROM. Neck supple. No JVD. No tracheal deviation. CVS: RRR, S1/S2 +, no murmurs, no gallops, no carotid bruit.  Pulmonary: Bilateral prolonged expiratory wheezing without crackles  Abdominal: Soft. BS +,  no distension, tenderness, rebound or guarding.  Musculoskeletal: Normal range of motion. No edema and no tenderness.  Neuro: Alert. CN 2-12 grossly intact. No focal deficits. Skin: Skin is warm and dry. No rash noted. Psychiatric: Normal  mood and affect.      LABORATORY PANEL:   CBC Recent Labs  Lab 07/20/17 0451  WBC 16.1*  HGB 13.1  HCT 41.4  PLT 241   ------------------------------------------------------------------------------------------------------------------  Chemistries  Recent Labs  Lab 07/20/17 0451  NA 137  K 4.0  CL 99*  CO2 29  GLUCOSE 110*  BUN 25*  CREATININE 0.76  CALCIUM 8.3*   ------------------------------------------------------------------------------------------------------------------  Cardiac Enzymes Recent Labs  Lab 07/19/17 0826 07/19/17 1236 07/19/17 1853  TROPONINI 0.08* 0.08* 0.07*   ------------------------------------------------------------------------------------------------------------------  RADIOLOGY:  Dg Chest Port 1 View  Result Date: 07/19/2017 CLINICAL DATA:  Increased shortness of breath over the past week. EXAM: PORTABLE CHEST 1 VIEW COMPARISON:  Chest radiograph 07/02/2017 and CT 07/03/2017 FINDINGS: The cardiomediastinal silhouette is unchanged and within normal limits. Aortic atherosclerosis is noted. Chronic interstitial coarsening is similar to the prior radiograph. There is no evidence of acute airspace consolidation, overt edema, sizable pleural effusion, or pneumothorax. No acute osseous abnormality is identified. IMPRESSION: COPD without evidence of acute cardiopulmonary process. Electronically Signed   By: Sebastian AcheAllen  Grady M.D.   On: 07/19/2017 09:05     ASSESSMENT AND PLAN:   61 year old male with history of COPD and chronic hypoxic respiratory failure due to COPD presents with shortness of breath and wheezing.  1. Acute on chronic hypoxic respiratory failure in the setting of COPD exacerbation Wean oxygen to baseline Patient will need increased dose of IV steroids due to bilateral wheezing today and increasing shortness of breath. Continue nebulizer treatments.  2. Acute laryngitis: Patient seen and evaluated by ENT Continue reflux  management with PPI to be dosed 30-60 minutes prior to evening meal.   3. Diabetes: Continue sliding scale and monitoring blood sugars  4.  Essential hypertension: Continue HCTZ, lisinopril and metoprolol  5. Hyperlipidemia: Continue statin  6. Anxiety: Continue Xanax  7. Tobacco dependence: Patient is encouraged to quit smoking. Counseling was provided for 4 minutes.     Management plans discussed with the patient and he is in agreement.  CODE STATUS: full  TOTAL TIME TAKING CARE OF THIS PATIENT: 30 minutes.     POSSIBLE D/C 1-2 days, DEPENDING ON CLINICAL CONDITION.   Kendahl Bumgardner M.D on 07/20/2017 at 12:35 PM  Between 7am to 6pm - Pager - [phone] After 6pm go to [email or web address] - password [email or web address]  [facility]  Office  [phone]  CC: Primary care physician; [name], Sionne A, MD  Note: This dictation was prepared with Dragon dictation along with smaller phrase technology. Any transcriptional errors that result from this process are unintentional.

## 2017-07-20 NOTE — Progress Notes (Signed)
Medications administered by student RN [id number] with supervision of Clinical Instructor Mckenzie Toruno MSN, RN-BC or patient's assigned RN.   

## 2017-07-21 LAB — BASIC METABOLIC PANEL
Anion gap: 8 (ref 5–15)
BUN: 25 mg/dL — AB (ref 6–20)
CALCIUM: 8.6 mg/dL — AB (ref 8.9–10.3)
CO2: 30 mmol/L (ref 22–32)
CREATININE: 0.8 mg/dL (ref 0.61–1.24)
Chloride: 97 mmol/L — ABNORMAL LOW (ref 101–111)
GFR calc non Af Amer: >60 mL/min (ref >60)
GLUCOSE: 148 mg/dL — AB (ref 65–99)
Potassium: 4.3 mmol/L (ref 3.5–5.1)
Sodium: 135 mmol/L (ref 135–145)

## 2017-07-21 LAB — CBC
HEMATOCRIT: 42.7 % (ref 40.0–52.0)
Hemoglobin: 13.7 g/dL (ref 13.0–18.0)
MCH: 30 pg (ref 26.0–34.0)
MCHC: 32.1 g/dL (ref 32.0–36.0)
MCV: 93.4 fL (ref 80.0–100.0)
Platelets: 240 10*3/uL (ref 150–440)
RBC: 4.57 MIL/uL (ref 4.40–5.90)
RDW: 14.2 % (ref 11.5–14.5)
WBC: 12.5 10*3/uL — ABNORMAL HIGH (ref 3.8–10.6)

## 2017-07-21 LAB — GLUCOSE, CAPILLARY
GLUCOSE-CAPILLARY: 197 mg/dL — AB (ref 65–99)
Glucose-Capillary: 93 mg/dL (ref 65–99)
Glucose-Capillary: 130 mg/dL — ABNORMAL HIGH (ref 65–99)
Glucose-Capillary: 251 mg/dL — ABNORMAL HIGH (ref 65–99)
Glucose-Capillary: 75 mg/dL (ref 65–99)

## 2017-07-21 LAB — PROCALCITONIN

## 2017-07-21 MED ORDER — ALPRAZOLAM 0.5 MG PO TABS
0.2500 mg | ORAL_TABLET | Freq: Two times a day (BID) | ORAL | Status: DC
  Administered 2017-07-21: 0.25 mg via ORAL
  Filled 2017-07-21: qty 1

## 2017-07-21 MED ORDER — ALPRAZOLAM 0.5 MG PO TABS
0.2500 mg | ORAL_TABLET | Freq: Three times a day (TID) | ORAL | Status: DC | PRN
  Administered 2017-07-21 – 2017-07-22 (×2): 0.25 mg via ORAL
  Filled 2017-07-21 (×2): qty 1

## 2017-07-21 MED ORDER — DIPHENHYDRAMINE HCL 25 MG PO CAPS
25.0000 mg | ORAL_CAPSULE | Freq: Every evening | ORAL | Status: DC | PRN
  Filled 2017-07-21: qty 1

## 2017-07-21 NOTE — Progress Notes (Signed)
PT Cancellation Note  Patient Details Name: Kyle Rich MRN: [id number] DOB: 1956-05-23   Cancelled Treatment:    Reason Eval/Treat Not Completed: Patient declined, no reason specified(Consult received and chart reviewed.  Patient currently resting; requests therapist allow him to rest, improve respriatory status prior to initiation of exertional activity.  Will continue efforts at later time/date as appropriate.)   Kyle Rich, PT, DPT, NCS 07/21/17, 10:12 AM [phone]

## 2017-07-21 NOTE — Progress Notes (Signed)
[facility] - [place] at [facility]   PATIENT NAME: Marnee GuarneriJames Leggitt    MR#:  [id number]  DATE OF BIRTH:  08/11/1956  SUBJECTIVE:   Patient tearful this morning. His mother passed away 2 weeks ago. He is afraid of dying. He still has some shortness of breath and wheezing  REVIEW OF SYSTEMS:    Review of Systems  Constitutional: Negative for fever, chills weight loss HENT: Negative for ear pain, nosebleeds, congestion, facial swelling, rhinorrhea, neck pain, neck stiffness and ear discharge.   Respiratory: Positive for cough, shortness of breath, wheezing  Cardiovascular: Negative for chest pain, palpitations and leg swelling.  Gastrointestinal: Negative for heartburn, abdominal pain, vomiting, diarrhea or consitpation Genitourinary: Negative for dysuria, urgency, frequency, hematuria Musculoskeletal: Negative for back pain or joint pain Neurological: Negative for dizziness, seizures, syncope, focal weakness,  numbness and headaches.  Hematological: Does not bruise/bleed easily.  Psychiatric/Behavioral: Negative for hallucinations, confusion, dysphoric mood    Tolerating Diet: yes      DRUG ALLERGIES:  No Known Allergies  VITALS:  Blood pressure (!) 156/65, pulse (!) 59, temperature (!) 97.5 F (36.4 C), resp. rate 14, height 5\' 4"  (1.626 m), weight 85 kg (187 lb 5 oz), SpO2 95 %.  PHYSICAL EXAMINATION:  Constitutional: Appears well-developed and well-nourished. No distress. HENT: Normocephalic. Marland Kitchen. Oropharynx is clear and moist.  Eyes: Conjunctivae and EOM are normal. PERRLA, no scleral icterus.  Neck: Normal ROM. Neck supple. No JVD. No tracheal deviation. CVS: RRR, S1/S2 +, no murmurs, no gallops, no carotid bruit.  Pulmonary: Bilateral prolonged expiratory wheezing without crackles  Abdominal: Soft. BS +,  no distension, tenderness, rebound or guarding.  Musculoskeletal: Normal range of motion. No edema and no tenderness.  Neuro: Alert. CN 2-12 grossly  intact. No focal deficits. Skin: Skin is warm and dry. No rash noted. Psychiatric: tearful this am    LABORATORY PANEL:   CBC Recent Labs  Lab 07/21/17 0513  WBC 12.5*  HGB 13.7  HCT 42.7  PLT 240   ------------------------------------------------------------------------------------------------------------------  Chemistries  Recent Labs  Lab 07/21/17 0513  NA 135  K 4.3  CL 97*  CO2 30  GLUCOSE 148*  BUN 25*  CREATININE 0.80  CALCIUM 8.6*   ------------------------------------------------------------------------------------------------------------------  Cardiac Enzymes Recent Labs  Lab 07/19/17 0826 07/19/17 1236 07/19/17 1853  TROPONINI 0.08* 0.08* 0.07*   ------------------------------------------------------------------------------------------------------------------  RADIOLOGY:  No results found.   ASSESSMENT AND PLAN:   61 year old male with history of COPD and chronic hypoxic respiratory failure due to COPD presents with shortness of breath and wheezing.  1. Acute on chronic hypoxic respiratory failure in the setting of COPD exacerbation He is on baseline oxygen Continue Solu-Medrol 40 mg IV every 12 hours for today with plans to wean hopefully tomorrow. Continue inhalers and nebulizer treatment.   2. Acute laryngitis: Patient seen and evaluated by ENT Continue reflux management with PPI to be dosed 30-60 minutes prior to evening meal.   3. Diabetes: Continue sliding scale and monitoring blood sugars  4. Essential hypertension: Continue HCTZ, lisinopril and metoprolol  5. Hyperlipidemia: Continue statin  6. Anxiety/Depression: Continue Xanax PRN and Lexapro  7. Tobacco dependence: Patient is encouraged to quit smoking.      Management plans discussed with the patient and he is in agreement.  CODE STATUS: full  TOTAL TIME TAKING CARE OF THIS PATIENT: 24 minutes.     POSSIBLE D/C 1-2 days, DEPENDING ON CLINICAL  CONDITION.   Aidah Forquer M.D on 07/21/2017 at 9:27  AM  Between 7am to 6pm - Pager - [phone] After 6pm go to [email or web address] - password [email or web address]  [facility] [place] [facility]  Office  [phone]  CC: Primary care physician; [name], Sionne A, MD  Note: This dictation was prepared with Dragon dictation along with smaller phrase technology. Any transcriptional errors that result from this process are unintentional.

## 2017-07-21 NOTE — Progress Notes (Signed)
[facility] - [place] at [facility]   PATIENT NAME: Kyle Rich    MR#:  [id number]  DATE OF BIRTH:  Jan 19, 1956  SUBJECTIVE:   Patient tearful this morning. His mother passed away 2 weeks ago. He is afraid of dying. He still has some shortness of breath and wheezing  REVIEW OF SYSTEMS:    Review of Systems  Constitutional: Negative for fever, chills weight loss HENT: Negative for ear pain, nosebleeds, congestion, facial swelling, rhinorrhea, neck pain, neck stiffness and ear discharge.   Respiratory: Positive for cough, shortness of breath, wheezing  Cardiovascular: Negative for chest pain, palpitations and leg swelling.  Gastrointestinal: Negative for heartburn, abdominal pain, vomiting, diarrhea or consitpation Genitourinary: Negative for dysuria, urgency, frequency, hematuria Musculoskeletal: Negative for back pain or joint pain Neurological: Negative for dizziness, seizures, syncope, focal weakness,  numbness and headaches.  Hematological: Does not bruise/bleed easily.  Psychiatric/Behavioral: Negative for hallucinations, confusion, dysphoric mood    Tolerating Diet: yes      DRUG ALLERGIES:  No Known Allergies  VITALS:  Blood pressure (!) 156/65, pulse (!) 59, temperature (!) 97.5 F (36.4 C), resp. rate 14, height 5\' 4"  (1.626 m), weight 85 kg (187 lb 5 oz), SpO2 95 %.  PHYSICAL EXAMINATION:  Constitutional: Appears well-developed and well-nourished. No distress. HENT: Normocephalic. Marland Kitchen. Oropharynx is clear and moist.  Eyes: Conjunctivae and EOM are normal. PERRLA, no scleral icterus.  Neck: Normal ROM. Neck supple. No JVD. No tracheal deviation. CVS: RRR, S1/S2 +, no murmurs, no gallops, no carotid bruit.  Pulmonary: Bilateral prolonged expiratory wheezing without crackles  Abdominal: Soft. BS +,  no distension, tenderness, rebound or guarding.  Musculoskeletal: Normal range of motion. No edema and no tenderness.  Neuro: Alert. CN 2-12 grossly  intact. No focal deficits. Skin: Skin is warm and dry. No rash noted. Psychiatric: tearful this am    LABORATORY PANEL:   CBC Recent Labs  Lab 07/21/17 0513  WBC 12.5*  HGB 13.7  HCT 42.7  PLT 240   ------------------------------------------------------------------------------------------------------------------  Chemistries  Recent Labs  Lab 07/21/17 0513  NA 135  K 4.3  CL 97*  CO2 30  GLUCOSE 148*  BUN 25*  CREATININE 0.80  CALCIUM 8.6*   ------------------------------------------------------------------------------------------------------------------  Cardiac Enzymes Recent Labs  Lab 07/19/17 0826 07/19/17 1236 07/19/17 1853  TROPONINI 0.08* 0.08* 0.07*   ------------------------------------------------------------------------------------------------------------------  RADIOLOGY:  No results found.   ASSESSMENT AND PLAN:   61 year old male with history of COPD and chronic hypoxic respiratory failure due to COPD presents with shortness of breath and wheezing.  1. Acute on chronic hypoxic respiratory failure in the setting of COPD exacerbation He is on baseline oxygen Continue Solu-Medrol 40 mg IV every 12 hours for today with plans to wean hopefully tomorrow. Continue inhalers and nebulizer treatment.   2. Acute laryngitis: Patient seen and evaluated by ENT Continue reflux management with PPI to be dosed 30-60 minutes prior to evening meal.   3. Diabetes: Continue sliding scale and monitoring blood sugars  4. Essential hypertension: Continue HCTZ, lisinopril and metoprolol  5. Hyperlipidemia: Continue statin  6. Anxiety/Depression: Continue Xanax PRN and Lexapro  7. Tobacco dependence: Patient is encouraged to quit smoking.      Management plans discussed with the patient and he is in agreement.  CODE STATUS: full  TOTAL TIME TAKING CARE OF THIS PATIENT: 24 minutes.     POSSIBLE D/C 1-2 days, DEPENDING ON CLINICAL  CONDITION.   Omer Monter M.D on 07/21/2017 at 9:40  AM  Between 7am to 6pm - Pager - [phone] After 6pm go to [email or web address] - password [email or web address]  [facility] [place] [facility]  Office  [phone]  CC: Primary care physician; Kyle Rich  Note: This dictation was prepared with Dragon dictation along with smaller phrase technology. Any transcriptional errors that result from this process are unintentional.

## 2017-07-22 LAB — GLUCOSE, CAPILLARY
GLUCOSE-CAPILLARY: 159 mg/dL — AB (ref 65–99)
GLUCOSE-CAPILLARY: 161 mg/dL — AB (ref 65–99)
Glucose-Capillary: 89 mg/dL (ref 65–99)
Glucose-Capillary: 162 mg/dL — ABNORMAL HIGH (ref 65–99)

## 2017-07-22 MED ORDER — ESCITALOPRAM OXALATE 20 MG PO TABS
20.0000 mg | ORAL_TABLET | Freq: Every day | ORAL | Status: DC
  Filled 2017-07-22: qty 1

## 2017-07-22 MED ORDER — PANTOPRAZOLE SODIUM 40 MG PO TBEC
40.0000 mg | DELAYED_RELEASE_TABLET | Freq: Every day | ORAL | Status: DC
  Administered 2017-07-22: 40 mg via ORAL
  Filled 2017-07-22: qty 1

## 2017-07-22 MED ORDER — ESCITALOPRAM OXALATE 10 MG PO TABS
20.0000 mg | ORAL_TABLET | Freq: Every day | ORAL | Status: DC
  Administered 2017-07-22: 20:00:00 20 mg via ORAL
  Filled 2017-07-22: qty 2

## 2017-07-22 MED ORDER — HYDROCOD POLST-CPM POLST ER 10-8 MG/5ML PO SUER
5.0000 mL | Freq: Two times a day (BID) | ORAL | Status: DC
  Administered 2017-07-22 – 2017-07-23 (×3): 5 mL via ORAL
  Filled 2017-07-22 (×3): qty 5

## 2017-07-22 NOTE — Progress Notes (Signed)
[facility] - [place] at [facility]   PATIENT NAME: Kyle GuarneriJames Rich    MR#:  [id number]  DATE OF BIRTH:  Nov 23, 1955  SUBJECTIVE:  CHIEF COMPLAINT:   Chief Complaint  Patient presents with  . Shortness of Breath   - Still has some wheezing and shortness of breath. -Also has significant anxiety. Mom passed away 2 weeks ago  REVIEW OF SYSTEMS:  Review of Systems  Constitutional: Positive for malaise/fatigue. Negative for chills and fever.  HENT: Negative for congestion, ear discharge, hearing loss and nosebleeds.   Eyes: Negative for blurred vision and double vision.  Respiratory: Positive for cough, shortness of breath and wheezing.   Cardiovascular: Negative for chest pain, palpitations and leg swelling.  Gastrointestinal: Negative for abdominal pain, constipation, diarrhea, nausea and vomiting.  Genitourinary: Negative for dysuria.  Musculoskeletal: Negative for myalgias.  Neurological: Negative for dizziness, sensory change, speech change, focal weakness, seizures and headaches.  Psychiatric/Behavioral: Negative for depression.    DRUG ALLERGIES:  No Known Allergies  VITALS:  Blood pressure 94/78, pulse 78, temperature 97.7 F (36.5 C), temperature source Oral, resp. rate 18, height 5\' 4"  (1.626 m), weight 85 kg (187 lb 5 oz), SpO2 94 %.  PHYSICAL EXAMINATION:  Physical Exam  GENERAL:  61 y.o.-year-old obese patient lying in the bed with no acute distress.  EYES: Pupils equal, round, reactive to light and accommodation. No scleral icterus. Extraocular muscles intact.  HEENT: Head atraumatic, normocephalic. Oropharynx and nasopharynx clear.  NECK:  Supple, no jugular venous distention. No thyroid enlargement, no tenderness.  LUNGS: Scattered wheezing, no rails or crepitation. Moving air bilaterally, decreased at the bases. No use of accessory muscles to breathe.  CARDIOVASCULAR: S1, S2 normal. No murmurs, rubs, or gallops.  ABDOMEN: Soft, nontender, obese,  nondistended. Bowel sounds present. No organomegaly or mass.  EXTREMITIES: No pedal edema, cyanosis, or clubbing.  NEUROLOGIC: Cranial nerves II through XII are intact. Muscle strength 5/5 in all extremities. Sensation intact. Gait not checked.  PSYCHIATRIC: The patient is alert and oriented x 3. Anxious at times. SKIN: No obvious rash, lesion, or ulcer.    LABORATORY PANEL:   CBC Recent Labs  Lab 07/21/17 0513  WBC 12.5*  HGB 13.7  HCT 42.7  PLT 240   ------------------------------------------------------------------------------------------------------------------  Chemistries  Recent Labs  Lab 07/21/17 0513  NA 135  K 4.3  CL 97*  CO2 30  GLUCOSE 148*  BUN 25*  CREATININE 0.80  CALCIUM 8.6*   ------------------------------------------------------------------------------------------------------------------  Cardiac Enzymes Recent Labs  Lab 07/19/17 1853  TROPONINI 0.07*   ------------------------------------------------------------------------------------------------------------------  RADIOLOGY:  No results found.  EKG:   Orders placed or performed during the hospital encounter of 07/19/17  . ED EKG  . ED EKG    ASSESSMENT AND PLAN:   61 year old male with past medical history secondary to chronic respiratory failure with COPD on 2 L home oxygen, ongoing smoking, diabetes and hypertension presents to hospital secondary to worsening shortness of breath and wheezing.  #1 acute on chronic hypoxic respiratory failure-secondary to COPD exacerbation. -On Solu-Medrol IV twice a day, continue for 1 more day -Continue inhalers and nebulizer treatment -On 2 L oxygen and stable. -Added cough medications  #2 anxiety with depression-worsened panic attack episodes since death of his mother 2 weeks ago. -Increased her dose of Lexapro and continue Xanax as needed  #3 GERD-added PPI, this has been causing his laryngitis symptoms according to ENT  #4  hypertension-blood pressure is soft today, hold lisinopril and hydrochlorothiazide  #  5 diabetes mellitus-on sliding scale insulin. Sugars are well controlled  #6 tobacco use disorder-strongly counseled against smoking  #7 DVT prophylaxis-on Lovenox   Physical therapy recommended outpatient PT   All the records are reviewed and case discussed with Care Management/Social Workerr. Management plans discussed with the patient, family and they are in agreement.  CODE STATUS: Full code  TOTAL TIME TAKING CARE OF THIS PATIENT: 37 minutes.   POSSIBLE D/C TOMORROW DEPENDING ON CLINICAL CONDITION.   Enid BaasKALISETTI,Sparsh Callens M.D on 07/22/2017 at 12:41 PM  Between 7am to 6pm - Pager - [phone]  After 6pm go to [email or web address] - password [email or web address]  [facility] [place] [facility]  Office  [phone]  CC: Primary care physician; [name], Sionne A, MD

## 2017-07-22 NOTE — Progress Notes (Addendum)
Physical Therapy Evaluation Patient Details Name: Kyle Rich MRN: [id number] DOB: 1955/10/25 Today's Date: 07/22/2017   History of Present Illness  Patient presents to PT on 14 NOV with SOB. PMH includes DMII, HTN, Hepatitis C, anxiety, and depression.  Clinical Impression  Patient is a pleasant male admitted for above listed reasons. On evaluation, patient demonstrates independence in mobility and gait on 2L O2. Patient admits to not driving recently and requiring motor scooter in big grocery stores and would like to work towards ambulating independently. Because of this, it is recommended that patient f/u in OP PT upon d/c when medically ready to improve his cardiopulmonary endurance and reduce the risk of hospital readmissions.    Follow Up Recommendations Outpatient PT    Equipment Recommendations  None recommended by PT    Recommendations for Other Services       Precautions / Restrictions Precautions Precautions: Fall Restrictions Weight Bearing Restrictions: No      Mobility  Bed Mobility Overal bed mobility: Independent             General bed mobility comments: Patient moves from supine to sit independently.  Transfers Overall transfer level: Modified independent   Transfers: Sit to/from Stand Sit to Stand: Modified independent (Device/Increase time)         General transfer comment: Patient moves from sit to stand and stand to sit with good control.  Ambulation/Gait Ambulation/Gait assistance: Min guard Ambulation Distance (Feet): 360 Feet Assistive device: None       General Gait Details: Patient ambulates at decreased cadence, maintaing O2 levels >90%.  Stairs            Wheelchair Mobility    Modified Rankin (Stroke Patients Only)       Balance Overall balance assessment: Modified Independent Sitting-balance support: Feet supported Sitting balance-Leahy Scale: Good     Standing balance support: No upper extremity  supported Standing balance-Leahy Scale: Good                               Pertinent Vitals/Pain Pain Assessment: No/denies pain    Home Living Family/patient expects to be discharged to:: Private residence Living Arrangements: Children Available Help at Discharge: Family;Available PRN/intermittently Type of Home: House Home Access: Stairs to enter Entrance Stairs-Rails: None Entrance Stairs-Number of Steps: 4 Home Layout: Two level;Able to live on main level with bedroom/bathroom Home Equipment: Dan HumphreysWalker - 2 wheels      Prior Function Level of Independence: Needs assistance   Gait / Transfers Assistance Needed: Performed independently for short distances  ADL's / Homemaking Assistance Needed: Performed by daughter  Comments: Uses 2L O2 at home     Hand Dominance        Extremity/Trunk Assessment   Upper Extremity Assessment Upper Extremity Assessment: Generalized weakness    Lower Extremity Assessment Lower Extremity Assessment: Overall WFL for tasks assessed       Communication   Communication: No difficulties  Cognition Arousal/Alertness: Awake/alert Behavior During Therapy: WFL for tasks assessed/performed;Anxious Overall Cognitive Status: Within Functional Limits for tasks assessed                                        General Comments      Exercises     Assessment/Plan    PT Assessment Patent does not need any further PT services  PT Problem List Decreased activity tolerance;Decreased mobility;Cardiopulmonary status limiting activity       PT Treatment Interventions Gait training;Stair training;Functional mobility training;Therapeutic activities;Therapeutic exercise;Patient/family education    PT Goals (Current goals can be found in the Care Plan section)  Acute Rehab PT Goals Patient Stated Goal: "To improve endurance" PT Goal Formulation: With patient Time For Goal Achievement: 08/12/17 Potential to Achieve  Goals: Good    Frequency Other (Comment)(No IP PT)   Barriers to discharge        Co-evaluation               AM-PAC PT "6 Clicks" Daily Activity  Outcome Measure Difficulty turning over in bed (including adjusting bedclothes, sheets and blankets)?: None Difficulty moving from lying on back to sitting on the side of the bed? : None Difficulty sitting down on and standing up from a chair with arms (e.g., wheelchair, bedside commode, etc,.)?: None Help needed moving to and from a bed to chair (including a wheelchair)?: None Help needed walking in hospital room?: A Little Help needed climbing 3-5 steps with a railing? : A Little 6 Click Score: 22    End of Session Equipment Utilized During Treatment: Gait belt;Oxygen Activity Tolerance: Patient tolerated treatment well Patient left: in chair;with call bell/phone within reach   PT Visit Diagnosis: Muscle weakness (generalized) (M62.81)    Time: [id number] PT Time Calculation (min) (ACUTE ONLY): 12 min   Charges:   PT Evaluation $PT Eval Low Complexity: 1 Low     PT G Codes:   PT G-Codes **NOT FOR INPATIENT CLASS** Functional Assessment Tool Used: AM-PAC 6 Clicks Basic Mobility;Clinical judgement Functional Limitation: Mobility: Walking and moving around Mobility: Walking and Moving Around Current Status ([id number]): At least 20 percent but less than 40 percent impaired, limited or restricted Mobility: Walking and Moving Around Goal Status [phone]): At least 20 percent but less than 40 percent impaired, limited or restricted Mobility: Walking and Moving Around Discharge Status [phone]([id number]): At least 20 percent but less than 40 percent impaired, limited or restricted      Neita CarpJulie Ann Steffon Gladu, PT, DPT, COMT 07/22/2017, 10:07 AM

## 2017-07-23 LAB — BASIC METABOLIC PANEL
Anion gap: 9 (ref 5–15)
BUN: 26 mg/dL — AB (ref 6–20)
CALCIUM: 8.4 mg/dL — AB (ref 8.9–10.3)
CO2: 29 mmol/L (ref 22–32)
CREATININE: 0.81 mg/dL (ref 0.61–1.24)
Chloride: 97 mmol/L — ABNORMAL LOW (ref 101–111)
GFR calc Af Amer: >60 mL/min (ref >60)
GLUCOSE: 125 mg/dL — AB (ref 65–99)
Potassium: 4.1 mmol/L (ref 3.5–5.1)
Sodium: 135 mmol/L (ref 135–145)

## 2017-07-23 LAB — GLUCOSE, CAPILLARY: Glucose-Capillary: 135 mg/dL — ABNORMAL HIGH (ref 65–99)

## 2017-07-23 MED ORDER — IPRATROPIUM-ALBUTEROL 0.5-2.5 (3) MG/3ML IN SOLN: 3.0000 mL | Freq: Four times a day (QID) | RESPIRATORY_TRACT | 1 refills | Status: DC | PRN

## 2017-07-23 MED ORDER — ESCITALOPRAM OXALATE 20 MG PO TABS: 20.0000 mg | ORAL_TABLET | Freq: Every day | ORAL | 2 refills | Status: AC

## 2017-07-23 MED ORDER — TIOTROPIUM BROMIDE MONOHYDRATE 18 MCG IN CAPS
1.0000 | ORAL_CAPSULE | Freq: Every day | RESPIRATORY_TRACT | 2 refills | Status: AC
Start: 2017-07-23 — End: 2018-07-23

## 2017-07-23 MED ORDER — FLUTICASONE-SALMETEROL 500-50 MCG/DOSE IN AEPB
1.0000 | INHALATION_SPRAY | Freq: Two times a day (BID) | RESPIRATORY_TRACT | 2 refills | Status: AC
Start: 2017-07-23 — End: 2018-07-23

## 2017-07-23 MED ORDER — HYDROCOD POLST-CPM POLST ER 10-8 MG/5ML PO SUER: 5.0000 mL | Freq: Two times a day (BID) | ORAL | 0 refills | Status: DC

## 2017-07-23 MED ORDER — ALPRAZOLAM 0.25 MG PO TABS: 0.2500 mg | ORAL_TABLET | Freq: Two times a day (BID) | ORAL | 0 refills | Status: DC | PRN

## 2017-07-23 MED ORDER — PANTOPRAZOLE SODIUM 40 MG PO TBEC: 40.0000 mg | DELAYED_RELEASE_TABLET | Freq: Every day | ORAL | 2 refills | Status: AC

## 2017-07-23 MED ORDER — NICOTINE 14 MG/24HR TD PT24: 14.0000 mg | MEDICATED_PATCH | Freq: Every day | TRANSDERMAL | 0 refills | Status: AC

## 2017-07-23 MED ORDER — PREDNISONE 10 MG (21) PO TBPK: ORAL_TABLET | ORAL | 0 refills | Status: DC

## 2017-07-23 NOTE — Care Management Note (Signed)
Case Management Note  Patient Details  Name: Danford BadJames D Gendron MRN: [id number] Date of Birth: 1956/05/17  Subjective/Objective:        [facility]umption of care with [facility], SW.             Action/Plan:   Expected Discharge Date:                  Expected Discharge Plan:  Home w Home Health Services  In-House Referral:     Discharge planning Services  CM Consult  Post Acute Care Choice:  Home Health Choice offered to:  Patient  DME Arranged:  N/A DME Agency:  NA  HH Arranged:  RN, PT, Social Work [facility]  Status of Service:  Completed, signed off  If discussed at [employer] Meetings, dates discussed:    Additional Comments:  Mendy Chou A, RN 07/23/2017, 9:47 AM

## 2017-07-23 NOTE — Progress Notes (Signed)
IV removed. Prescriptions given to pt. Pt  Reports no pain. Discharge instructions given to pt. Pt brother to take pt home. Home health set up. Pt has no further concerns at this time.

## 2017-07-23 NOTE — Discharge Summary (Signed)
[facility] [place] at [facility]   PATIENT NAME: Kyle GuarneriJames Rich    MR#:  [id number]  DATE OF BIRTH:  12-28-1955  DATE OF ADMISSION:  07/19/2017   ADMITTING PHYSICIAN: Alford Highlandichard Wieting, MD  DATE OF DISCHARGE: 07/23/2017  PRIMARY CARE PHYSICIAN: [name], Sionne A, MD   ADMISSION DIAGNOSIS:   COPD exacerbation (HCC) [J44.1] Elevated troponin I level [R74.8]  DISCHARGE DIAGNOSIS:   Active Problems:   COPD with acute exacerbation (HCC)   SECONDARY DIAGNOSIS:   Past Medical History:  Diagnosis Date  . Arthritis   . Asthma   . CHF (congestive heart failure) (HCC)   . COPD (chronic obstructive pulmonary disease) (HCC)   . Diabetes mellitus without complication (HCC)   . Hepatitis C   . Hypertension     HOSPITAL COURSE:   61 year old male with past medical history secondary to chronic respiratory failure with COPD on 2 L home oxygen, ongoing smoking, diabetes and hypertension presents to hospital secondary to worsening shortness of breath and wheezing.  #1 acute on chronic hypoxic respiratory failure-secondary to COPD exacerbation. -Has chronic wheezing. Change Solu-Medrol to oral prednisone taper at discharge -Continue inhalers and nebulizer treatment -On 2 L oxygen and stable. -Added cough medications -No indication for antibiotics  #2 anxiety with depression-worsened panic attack episodes since death of his mother 2 weeks ago. -Increased dose of Lexapro and continue Xanax as needed  #3 GERD-added PPI, this has been causing his laryngitis symptoms according to ENT  #4 hypertension-blood pressure has been soft. Hold lisinopril & hydrochlorothiazide. Continue metoprolol at discharge  #5 diabetes mellitus-sugars well controlled. Continue metformin  #6 tobacco use disorder-strongly counseled against smoking Has been smoking up until this admission. Started on nicotine patch at discharge   Will be discharged with home health    DISCHARGE  CONDITIONS:   Guarded CONSULTS OBTAINED:    none  DRUG ALLERGIES:   No Known Allergies DISCHARGE MEDICATIONS:   Allergies as of 07/23/2017   No Known Allergies     Medication List    STOP taking these medications   hydrochlorothiazide 25 MG tablet Commonly known as:  HYDRODIURIL   lisinopril 10 MG tablet Commonly known as:  PRINIVIL,ZESTRIL   potassium chloride SA 20 MEQ tablet Commonly known as:  K-DUR,KLOR-CON   valACYclovir 1000 MG tablet Commonly known as:  VALTREX     TAKE these medications   ALPRAZolam 0.25 MG tablet Commonly known as:  XANAX Take 1 tablet (0.25 mg total) 2 (two) times daily as needed by mouth for anxiety.   aspirin 325 MG EC tablet Take 1 tablet (325 mg total) by mouth daily.   atorvastatin 40 MG tablet Commonly known as:  LIPITOR Take 1 tablet (40 mg total) by mouth daily at 6 PM.   chlorpheniramine-HYDROcodone 10-8 MG/5ML Suer Commonly known as:  TUSSIONEX Take 5 mLs every 12 (twelve) hours by mouth.   escitalopram 20 MG tablet Commonly known as:  LEXAPRO Take 1 tablet (20 mg total) at bedtime by mouth.   Fluticasone-Salmeterol 500-50 MCG/DOSE Aepb Commonly known as:  ADVAIR DISKUS Inhale 1 puff every 12 (twelve) hours into the lungs.   ipratropium-albuterol 0.5-2.5 (3) MG/3ML Soln Commonly known as:  DUONEB Take 3 mLs every 6 (six) hours as needed by nebulization (wheezing).   metFORMIN 500 MG tablet Commonly known as:  GLUCOPHAGE Take by mouth 2 (two) times daily with a meal.   metoprolol tartrate 25 MG tablet Commonly known as:  LOPRESSOR Take 1 tablet (25  mg total) by mouth 2 (two) times daily.   nicotine 14 mg/24hr patch Commonly known as:  NICODERM CQ - dosed in mg/24 hours Place 1 patch (14 mg total) daily onto the skin. Start taking on:  07/24/2017   pantoprazole 40 MG tablet Commonly known as:  PROTONIX Take 1 tablet (40 mg total) daily by mouth.   predniSONE 10 MG (21) Tbpk tablet Commonly known as:   STERAPRED UNI-PAK 21 TAB 6 tabs PO x 1 day 5 tabs PO x 1 day 4 tabs PO x 1 day 3 tabs PO x 1 day 2 tabs PO x 1 day 1 tab PO x 1 day and stop What changed:  additional instructions   PROAIR HFA 108 (90 Base) MCG/ACT inhaler Generic drug:  albuterol Inhale 2 puffs into the lungs every 6 (six) hours as needed for wheezing. What changed:  Another medication with the same name was removed. Continue taking this medication, and follow the directions you see here.   SUBOXONE 8-2 MG Film Generic drug:  Buprenorphine HCl-Naloxone HCl Place 1 Film (8 mg total) under the tongue 2 (two) times daily NADEAN [id number]   tiotropium 18 MCG inhalation capsule Commonly known as:  SPIRIVA HANDIHALER Place 1 capsule (18 mcg total) daily into inhaler and inhale.        DISCHARGE INSTRUCTIONS:   1. PCP f/u in 1-2 weeks  DIET:   Cardiac diet  ACTIVITY:   Activity as tolerated  OXYGEN:   Home Oxygen: Yes.    Oxygen Delivery: 2 liters/min via Patient connected to nasal cannula oxygen  DISCHARGE LOCATION:   home   If you experience worsening of your admission symptoms, develop shortness of breath, life threatening emergency, suicidal or homicidal thoughts you must seek medical attention immediately by calling 911 or calling your MD immediately  if symptoms less severe.  You Must read complete instructions/literature along with all the possible adverse reactions/side effects for all the Medicines you take and that have been prescribed to you. Take any new Medicines after you have completely understood and accpet all the possible adverse reactions/side effects.   Please note  You were cared for by a hospitalist during your hospital stay. If you have any questions about your discharge medications or the care you received while you were in the hospital after you are discharged, you can call the unit and asked to speak with the hospitalist on call if the hospitalist that took care of you is not  available. Once you are discharged, your primary care physician will handle any further medical issues. Please note that NO REFILLS for any discharge medications will be authorized once you are discharged, as it is imperative that you return to your primary care physician (or establish a relationship with a primary care physician if you do not have one) for your aftercare needs so that they can reassess your need for medications and monitor your lab values.    On the day of Discharge:  VITAL SIGNS:   Blood pressure (!) 163/65, pulse 64, temperature 98 F (36.7 C), resp. rate 20, height 5\' 4"  (1.626 m), weight 85 kg (187 lb 5 oz), SpO2 95 %.  PHYSICAL EXAMINATION:    GENERAL:  61 y.o.-year-old obese patient lying in the bed with no acute distress.  EYES: Pupils equal, round, reactive to light and accommodation. No scleral icterus. Extraocular muscles intact.  HEENT: Head atraumatic, normocephalic. Oropharynx and nasopharynx clear.  NECK:  Supple, no jugular venous distention. No thyroid enlargement,  no tenderness.  LUNGS: Scattered wheezing, no rales or crepitation. Moving air bilaterally, decreased at the bases. No use of accessory muscles to breathe.  CARDIOVASCULAR: S1, S2 normal. No murmurs, rubs, or gallops.  ABDOMEN: Soft, nontender, obese, nondistended. Bowel sounds present. No organomegaly or mass.  EXTREMITIES: No pedal edema, cyanosis, or clubbing.  NEUROLOGIC: Cranial nerves II through XII are intact. Muscle strength 5/5 in all extremities. Sensation intact. Gait not checked.  PSYCHIATRIC: The patient is alert and oriented x 3. Anxious at times. SKIN: No obvious rash, lesion, or ulcer.     DATA REVIEW:   CBC Recent Labs  Lab 07/21/17 0513  WBC 12.5*  HGB 13.7  HCT 42.7  PLT 240    Chemistries  Recent Labs  Lab 07/23/17 0551  NA 135  K 4.1  CL 97*  CO2 29  GLUCOSE 125*  BUN 26*  CREATININE 0.81  CALCIUM 8.4*     Microbiology Results  Results for orders  placed or performed during the hospital encounter of 07/02/17  Culture, blood (routine x 2)     Status: None   Collection Time: 07/02/17  7:59 PM  Result Value Ref Range Status   Specimen Description BLOOD RIGHT HAND  Final   Special Requests   Final    BOTTLES DRAWN AEROBIC AND ANAEROBIC Blood Culture adequate volume   Culture NO GROWTH 5 DAYS  Final   Report Status 07/07/2017 FINAL  Final  Culture, blood (routine x 2)     Status: None   Collection Time: 07/02/17  7:59 PM  Result Value Ref Range Status   Specimen Description BLOOD LEFT HAND  Final   Special Requests   Final    BOTTLES DRAWN AEROBIC AND ANAEROBIC Blood Culture adequate volume   Culture NO GROWTH 5 DAYS  Final   Report Status 07/07/2017 FINAL  Final    RADIOLOGY:  No results found.   Management plans discussed with the patient, family and they are in agreement.  CODE STATUS:     Code Status Orders  (From admission, onward)        Start     Ordered   07/19/17 1030  Full code  Continuous     07/19/17 1030    Code Status History    Date Active Date Inactive Code Status Order ID Comments User Context   07/08/2017 14:12 07/08/2017 19:13 Partial Code [id number]  Milagros LollSudini, Srikar, MD Inpatient   07/03/2017 01:11 07/08/2017 14:12 Full Code [id number]  Bertrum SolSalary, Montell D, MD Inpatient   06/15/2016 10:07 06/15/2016 22:42 Full Code [id number]  Shaune Pollackhen, Qing, MD Inpatient   06/09/2015 11:59 06/10/2015 15:27 Full Code [id number]  Houston SirenSainani, Vivek J, MD Inpatient      TOTAL TIME TAKING CARE OF THIS PATIENT: 38 minutes.    Enid BaasKALISETTI,Tessi Eustache M.D on 07/23/2017 at 11:29 AM  Between 7am to 6pm - Pager - [phone]  After 6pm go to [email or web address] - Social research officer, [email or web address] [email or web address]  [facility] [place] [facility]  Office  [phone]  CC: Primary care physician; [name], Sionne A, MD   Note: This dictation was prepared with Dragon dictation along with smaller phrase technology. Any transcriptional errors that result from this  process are unintentional.

## 2017-07-29 LAB — BLOOD GAS, VENOUS
ACID-BASE EXCESS: 12.6 mmol/L — AB (ref 0.0–2.0)
BICARBONATE: 40.3 mmol/L — AB (ref 20.0–28.0)
O2 Saturation: 96.1 %
PATIENT TEMPERATURE: 37
pCO2, Ven: 65 mmHg — ABNORMAL HIGH (ref 44.0–60.0)
pH, Ven: 7.4 (ref 7.250–7.430)
pO2, Ven: 83 mmHg — ABNORMAL HIGH (ref 32.0–45.0)

## 2017-09-11 ENCOUNTER — Encounter: Payer: Medicaid Other | Attending: Family Medicine

## 2017-09-11 ENCOUNTER — Other Ambulatory Visit: Payer: Self-pay

## 2017-09-11 VITALS — Ht 63.5 in | Wt 196.7 lb

## 2017-09-11 DIAGNOSIS — J449 Chronic obstructive pulmonary disease, unspecified: Secondary | ICD-10-CM | POA: Diagnosis not present

## 2017-09-11 NOTE — Progress Notes (Signed)
Pulmonary Individual Treatment Plan  Patient Details  Name: Kyle Rich MRN: [id number] Date of Birth: 02/15/1956 Referring Provider:     Pulmonary Rehab from 09/11/2017 in [facility] Cardiac and Pulmonary Rehab  Referring Provider  Salome Holmes MD      Initial Encounter Date:    Pulmonary Rehab from 09/11/2017 in [facility] Cardiac and Pulmonary Rehab  Date  09/11/17  Referring Provider  Salome Holmes MD      Visit Diagnosis: Chronic obstructive pulmonary disease, unspecified COPD type ([place])  Patient's Home Medications on Admission:  Current Outpatient Medications:  .  albuterol (PROAIR HFA) 108 (90 BASE) MCG/ACT inhaler, Inhale 2 puffs into the lungs every 6 (six) hours as needed for wheezing. , Disp: , Rfl:  .  ALPRAZolam (XANAX) 0.25 MG tablet, Take 1 tablet (0.25 mg total) 2 (two) times daily as needed by mouth for anxiety., Disp: 20 tablet, Rfl: 0 .  aspirin EC 325 MG EC tablet, Take 1 tablet (325 mg total) by mouth daily., Disp: 30 tablet, Rfl: 0 .  atorvastatin (LIPITOR) 40 MG tablet, Take 1 tablet (40 mg total) by mouth daily at 6 PM., Disp: , Rfl:  .  chlorpheniramine-HYDROcodone (TUSSIONEX) 10-8 MG/5ML SUER, Take 5 mLs every 12 (twelve) hours by mouth., Disp: 115 mL, Rfl: 0 .  escitalopram (LEXAPRO) 20 MG tablet, Take 1 tablet (20 mg total) at bedtime by mouth., Disp: 30 tablet, Rfl: 2 .  Fluticasone-Salmeterol (ADVAIR DISKUS) 500-50 MCG/DOSE AEPB, Inhale 1 puff every 12 (twelve) hours into the lungs., Disp: 60 each, Rfl: 2 .  ipratropium-albuterol (DUONEB) 0.5-2.5 (3) MG/3ML SOLN, Take 3 mLs every 6 (six) hours as needed by nebulization (wheezing)., Disp: 360 mL, Rfl: 1 .  metFORMIN (GLUCOPHAGE) 500 MG tablet, Take by mouth 2 (two) times daily with a meal., Disp: , Rfl:  .  metoprolol tartrate (LOPRESSOR) 25 MG tablet, Take 1 tablet (25 mg total) by mouth 2 (two) times daily., Disp: , Rfl:  .  nicotine (NICODERM CQ - DOSED IN MG/24 HOURS) 14 mg/24hr patch, Place 1 patch (14 mg total)  daily onto the skin., Disp: 28 patch, Rfl: 0 .  pantoprazole (PROTONIX) 40 MG tablet, Take 1 tablet (40 mg total) daily by mouth., Disp: 30 tablet, Rfl: 2 .  predniSONE (STERAPRED UNI-PAK 21 TAB) 10 MG (21) TBPK tablet, 6 tabs PO x 1 day 5 tabs PO x 1 day 4 tabs PO x 1 day 3 tabs PO x 1 day 2 tabs PO x 1 day 1 tab PO x 1 day and stop, Disp: 21 tablet, Rfl: 0 .  SUBOXONE 8-2 MG FILM, Place 1 Film (8 mg total) under the tongue 2 (two) times daily NADEAN [id number], Disp: , Rfl: 0 .  tiotropium (SPIRIVA HANDIHALER) 18 MCG inhalation capsule, Place 1 capsule (18 mcg total) daily into inhaler and inhale., Disp: 30 capsule, Rfl: 2  Past Medical History: Past Medical History:  Diagnosis Date  . Arthritis   . Asthma   . CHF (congestive heart failure) ([place])   . COPD (chronic obstructive pulmonary disease) ([place])   . Diabetes mellitus without complication ([place])   . Hepatitis C   . Hypertension     Tobacco Use: Social History   Tobacco Use  Smoking Status Current Every Day Smoker  . Packs/day: 1.00  . Years: 40.00  . Pack years: 40.00  . Types: Cigarettes  Smokeless Tobacco Never Used  Tobacco Comment   he is interested but doesnt want to quit due to him  mother passing away    Labs: Recent Review Flowsheet Data    Labs for ITP Cardiac and Pulmonary Rehab Latest Ref Rng & Units 06/09/2015 06/15/2016 07/02/2017 07/03/2017 07/19/2017   Cholestrol 0 - 200 mg/dL - - - - -   LDLCALC 0 - 100 mg/dL - - - - -   HDL 40 - 60 mg/dL - - - - -   Trlycerides 0 - 200 mg/dL - - - - -   Hemoglobin A1c 4.8 - 5.6 % - - - 6.6(H) -   PHART 7.350 - 7.450 - - 7.41 7.38 -   PCO2ART 32.0 - 48.0 mmHg - - 50(H) 48 -   HCO3 20.0 - 28.0 mmol/L 33.1(H) 28.9(H) 31.7(H) 28.4(H) 40.3(H)   O2SAT % - 92.7 98.3 93.4 96.1       Pulmonary Assessment Scores: Pulmonary Assessment Scores    Row Name 09/11/17 1410         ADL UCSD   ADL Phase  Entry     SOB Score total  84     Rest  3     Walk  3     Stairs  5       Bath  4     Dress  3     Shop  4       CAT Score   CAT Score  27       mMRC Score   mMRC Score  3        Pulmonary Function Assessment: Pulmonary Function Assessment - 09/11/17 1428      Initial Spirometry Results   FVC%  33 %    FEV1%  27 %    FEV1/FVC Ratio  60.27    Comments  Best of 2, good patient effort      Post Bronchodilator Spirometry Results   FVC%  38.94 %    FEV1%  32.94 %    FEV1/FVC Ratio  62.12    Comments  Best of 2, good patient effort      Breath   Bilateral Breath Sounds  Wheezes    Shortness of Breath  Limiting activity;Fear of Shortness of Breath;Panic with Shortness of Breath;Yes       Exercise Target Goals: Date: 09/11/17  Exercise Program Goal: Individual exercise prescription set with THRR, safety & activity barriers. Participant demonstrates ability to understand and report RPE using BORG scale, to self-measure pulse accurately, and to acknowledge the importance of the exercise prescription.  Exercise Prescription Goal: Starting with aerobic activity 30 plus minutes a day, 3 days per week for initial exercise prescription. Provide home exercise prescription and guidelines that participant acknowledges understanding prior to discharge.  Activity Barriers & Risk Stratification: Activity Barriers & Cardiac Risk Stratification - 09/11/17 1539      Activity Barriers & Cardiac Risk Stratification   Activity Barriers  Deconditioning;Muscular Weakness;Shortness of Breath;Joint Problems;Balance Concerns bilateral hip pain       6 Minute Walk: 6 Minute Walk    Row Name 09/11/17 1530         6 Minute Walk   Phase  Initial     Distance  570 feet     Walk Time  4.65 minutes     # of Rest Breaks  2 1:20, stopped at 5:01     MPH  1.39     METS  1.59     RPE  13     Perceived Dyspnea   2  VO2 Peak  5.55     Symptoms  Yes (comment)     Comments  leg fatigue/heaviness/pain 10/10     Resting HR  67 bpm     Resting BP  146/76      Resting Oxygen Saturation   94 %     Exercise Oxygen Saturation  during 6 min walk  93 %     Max Ex. HR  85 bpm     Max Ex. BP  154/74     2 Minute Post BP  144/70       Interval HR   1 Minute HR  70     2 Minute HR  74     3 Minute HR  74     4 Minute HR  78     5 Minute HR  85     6 Minute HR  75     2 Minute Post HR  66     Interval Heart Rate?  Yes       Interval Oxygen   Interval Oxygen?  Yes     Baseline Oxygen Saturation %  94 %     1 Minute Oxygen Saturation %  93 %     1 Minute Liters of Oxygen  2 L     2 Minute Oxygen Saturation %  94 %     2 Minute Liters of Oxygen  2 L     3 Minute Oxygen Saturation %  94 %     3 Minute Liters of Oxygen  2 L     4 Minute Oxygen Saturation %  95 %     4 Minute Liters of Oxygen  2 L     5 Minute Oxygen Saturation %  95 %     5 Minute Liters of Oxygen  2 L     6 Minute Oxygen Saturation %  95 %     6 Minute Liters of Oxygen  2 L     2 Minute Post Oxygen Saturation %  96 %     2 Minute Post Liters of Oxygen  2 L       Oxygen Initial Assessment: Oxygen Initial Assessment - 09/11/17 1419      Home Oxygen   Home Oxygen Device  Home Concentrator;E-Tanks    Sleep Oxygen Prescription  Continuous    Liters per minute  2    Home Exercise Oxygen Prescription  Continuous    Liters per minute  2    Home at Rest Exercise Oxygen Prescription  Continuous    Liters per minute  2    Compliance with Home Oxygen Use  Yes      Initial 6 min Walk   Oxygen Used  Continuous    Liters per minute  2      Program Oxygen Prescription   Program Oxygen Prescription  Continuous    Liters per minute  2      Intervention   Short Term Goals  To learn and demonstrate proper use of respiratory medications;To learn and demonstrate proper pursed lip breathing techniques or other breathing techniques.;To learn and understand importance of monitoring SPO2 with pulse oximeter and demonstrate accurate use of the pulse oximeter.;To learn and understand  importance of maintaining oxygen saturations>88%;To learn and exhibit compliance with exercise, home and travel O2 prescription takes albuterol nebulizer, pro air inhaler. Advair and spiriva    Long  Term Goals  Exhibits compliance with exercise, home and  travel O2 prescription;Verbalizes importance of monitoring SPO2 with pulse oximeter and return demonstration;Maintenance of O2 saturations>88%;Exhibits proper breathing techniques, such as pursed lip breathing or other method taught during program session;Compliance with respiratory medication;Demonstrates proper use of MDI's       Oxygen Re-Evaluation:   Oxygen Discharge (Final Oxygen Re-Evaluation):   Initial Exercise Prescription: Initial Exercise Prescription - 09/11/17 1500      Date of Initial Exercise RX and Referring Provider   Date  09/11/17    Referring Provider  Angus Palms MD      Oxygen   Oxygen  Continuous    Liters  2      Treadmill   MPH  1    Grade  0    Minutes  15    METs  1.77      NuStep   Level  1    SPM  80    Minutes  15    METs  1.7      Arm Ergometer   Level  1    Watts  22    RPM  25    Minutes  15    METs  1.7      Prescription Details   Frequency (times per week)  3    Duration  Progress to 45 minutes of aerobic exercise without signs/symptoms of physical distress      Intensity   THRR 40-80% of Max Heartrate  103-139    Ratings of Perceived Exertion  11-15    Perceived Dyspnea  0-4      Progression   Progression  Continue to progress workloads to maintain intensity without signs/symptoms of physical distress.      Resistance Training   Training Prescription  Yes    Weight  3 lbs    Reps  10-15       Perform Capillary Blood Glucose checks as needed.  Exercise Prescription Changes: Exercise Prescription Changes    Row Name 09/11/17 1500             Response to Exercise   Blood Pressure (Admit)  146/76       Blood Pressure (Exercise)  154/74       Blood Pressure  (Exit)  144/70       Heart Rate (Admit)  67 bpm       Heart Rate (Exercise)  85 bpm       Heart Rate (Exit)  66 bpm       Oxygen Saturation (Admit)  94 %       Oxygen Saturation (Exercise)  93 %       Oxygen Saturation (Exit)  96 %       Rating of Perceived Exertion (Exercise)  13       Perceived Dyspnea (Exercise)  2       Symptoms  leg fatigue/heaviness/pain 10/10       Comments  walk test results          Exercise Comments:   Exercise Goals and Review: Exercise Goals    Row Name 09/11/17 1544             Exercise Goals   Increase Physical Activity  Yes       Intervention  Provide advice, education, support and counseling about physical activity/exercise needs.;Develop an individualized exercise prescription for aerobic and resistive training based on initial evaluation findings, risk stratification, comorbidities and participant's personal goals.       Expected Outcomes  Achievement of  increased cardiorespiratory fitness and enhanced flexibility, muscular endurance and strength shown through measurements of functional capacity and personal statement of participant.       Increase Strength and Stamina  Yes       Intervention  Provide advice, education, support and counseling about physical activity/exercise needs.;Develop an individualized exercise prescription for aerobic and resistive training based on initial evaluation findings, risk stratification, comorbidities and participant's personal goals.       Expected Outcomes  Achievement of increased cardiorespiratory fitness and enhanced flexibility, muscular endurance and strength shown through measurements of functional capacity and personal statement of participant.       Able to understand and use rate of perceived exertion (RPE) scale  Yes       Intervention  Provide education and explanation on how to use RPE scale       Expected Outcomes  Short Term: Able to use RPE daily in rehab to express subjective intensity level;Long  Term:  Able to use RPE to guide intensity level when exercising independently       Able to understand and use Dyspnea scale  Yes       Intervention  Provide education and explanation on how to use Dyspnea scale       Expected Outcomes  Short Term: Able to use Dyspnea scale daily in rehab to express subjective sense of shortness of breath during exertion;Long Term: Able to use Dyspnea scale to guide intensity level when exercising independently       Knowledge and understanding of Target Heart Rate Range (THRR)  Yes       Intervention  Provide education and explanation of THRR including how the numbers were predicted and where they are located for reference       Expected Outcomes  Short Term: Able to state/look up THRR;Long Term: Able to use THRR to govern intensity when exercising independently;Short Term: Able to use daily as guideline for intensity in rehab       Able to check pulse independently  Yes       Intervention  Provide education and demonstration on how to check pulse in carotid and radial arteries.;Review the importance of being able to check your own pulse for safety during independent exercise       Expected Outcomes  Short Term: Able to explain why pulse checking is important during independent exercise;Long Term: Able to check pulse independently and accurately       Understanding of Exercise Prescription  Yes       Intervention  Provide education, explanation, and written materials on patient's individual exercise prescription       Expected Outcomes  Short Term: Able to explain program exercise prescription;Long Term: Able to explain home exercise prescription to exercise independently          Exercise Goals Re-Evaluation :   Discharge Exercise Prescription (Final Exercise Prescription Changes): Exercise Prescription Changes - 09/11/17 1500      Response to Exercise   Blood Pressure (Admit)  146/76    Blood Pressure (Exercise)  154/74    Blood Pressure (Exit)  144/70      Heart Rate (Admit)  67 bpm    Heart Rate (Exercise)  85 bpm    Heart Rate (Exit)  66 bpm    Oxygen Saturation (Admit)  94 %    Oxygen Saturation (Exercise)  93 %    Oxygen Saturation (Exit)  96 %    Rating of Perceived Exertion (Exercise)  13  Perceived Dyspnea (Exercise)  2    Symptoms  leg fatigue/heaviness/pain 10/10    Comments  walk test results       Nutrition:  Target Goals: Understanding of nutrition guidelines, daily intake of sodium 1500mg , cholesterol 200mg , calories 30% from fat and 7% or less from saturated fats, daily to have 5 or more servings of fruits and vegetables.  Biometrics: Pre Biometrics - 09/11/17 1545      Pre Biometrics   Height  5' 3.5" (1.613 m)    Weight  196 lb 11.2 oz (89.2 kg)    Waist Circumference  43 inches    Hip Circumference  42 inches    Waist to Hip Ratio  1.02 %    BMI (Calculated)  34.29        Nutrition Therapy Plan and Nutrition Goals: Nutrition Therapy & Goals - 09/11/17 1409      Personal Nutrition Goals   Comments  Patients wants to lose weight and eat healthier. He would like to meet with the dietician.      Intervention Plan   Intervention  Prescribe, educate and counsel regarding individualized specific dietary modifications aiming towards targeted core components such as weight, hypertension, lipid management, diabetes, heart failure and other comorbidities.;Nutrition handout(s) given to patient.    Expected Outcomes  Short Term Goal: Understand basic principles of dietary content, such as calories, fat, sodium, cholesterol and nutrients.;Long Term Goal: Adherence to prescribed nutrition plan.       Nutrition Discharge: Rate Your Plate Scores:   Nutrition Goals Re-Evaluation:   Nutrition Goals Discharge (Final Nutrition Goals Re-Evaluation):   Psychosocial: Target Goals: Acknowledge presence or absence of significant depression and/or stress, maximize coping skills, provide positive support system.  Participant is able to verbalize types and ability to use techniques and skills needed for reducing stress and depression.   Initial Review & Psychosocial Screening: Initial Psych Review & Screening - 09/11/17 1404      Initial Review   Current issues with  Current Depression;History of Depression;Current Psychotropic Meds;Current Sleep Concerns;Current Stress Concerns    Source of Stress Concerns  Chronic Illness;Family;Unable to perform yard/household activities;Unable to participate in former interests or hobbies    Comments  His mother died about three weeks ago and it has  been really hard on him.      Family Dynamics   Good Support System?  No    Strains  Intra-family strains    Comments  He blames his sister for putting his mom in a home. He feels like she was not getting the care she needed when she was in the home and thats what killed her.      Barriers   Psychosocial barriers to participate in program  The patient should benefit from training in stress management and relaxation.      Screening Interventions   Interventions  Yes;Encouraged to exercise;Program counselor consult;To provide support and resources with identified psychosocial needs;Provide feedback about the scores to participant    Expected Outcomes  Short Term goal: Utilizing psychosocial counselor, staff and physician to assist with identification of specific Stressors or current issues interfering with healing process. Setting desired goal for each stressor or current issue identified.;Long Term Goal: Stressors or current issues are controlled or eliminated.;Short Term goal: Identification and review with participant of any Quality of Life or Depression concerns found by scoring the questionnaire.;Long Term goal: The participant improves quality of Life and PHQ9 Scores as seen by post scores and/or verbalization of changes  Quality of Life Scores:   PHQ-9: Recent Review Flowsheet Data    Depression screen  [facility] 2/9 09/11/2017   Decreased Interest 2   Down, Depressed, Hopeless 2   PHQ - 2 Score 4   Altered sleeping 3   Tired, decreased energy 3   Change in appetite 2   Feeling bad or failure about yourself  3   Trouble concentrating 2   Moving slowly or fidgety/restless 1   Suicidal thoughts 2    PHQ-9 Score 20   Difficult doing work/chores Somewhat difficult     Interpretation of Total Score  Total Score Depression Severity:  1-4 = Minimal depression, 5-9 = Mild depression, 10-14 = Moderate depression, 15-19 = Moderately severe depression, 20-27 = Severe depression   Psychosocial Evaluation and Intervention:   Psychosocial Re-Evaluation:   Psychosocial Discharge (Final Psychosocial Re-Evaluation):   Education: Education Goals: Education classes will be provided on a weekly basis, covering required topics. Participant will state understanding/return demonstration of topics presented.  Learning Barriers/Preferences: Learning Barriers/Preferences - 09/11/17 1412      Learning Barriers/Preferences   Learning Barriers  None    Learning Preferences  None       Education Topics: Initial Evaluation Education: - Verbal, written and demonstration of respiratory meds, RPE/PD scales, oximetry and breathing techniques. Instruction on use of nebulizers and MDIs: cleaning and proper use, rinsing mouth with steroid doses and importance of monitoring MDI activations.   Pulmonary Rehab from 09/11/2017 in [facility] Cardiac and Pulmonary Rehab  Date  09/11/17  Educator  [facility]  Instruction Review Code  1- Verbalizes Understanding      General Nutrition Guidelines/Fats and Fiber: -Group instruction provided by verbal, written material, models and posters to present the general guidelines for heart healthy nutrition. Gives an explanation and review of dietary fats and fiber.   Controlling Sodium/Reading Food Labels: -Group verbal and written material supporting the discussion of sodium use in heart  healthy nutrition. Review and explanation with models, verbal and written materials for utilization of the food label.   Exercise Physiology & Risk Factors: - Group verbal and written instruction with models to review the exercise physiology of the cardiovascular system and associated critical values. Details cardiovascular disease risk factors and the goals associated with each risk factor.   Aerobic Exercise & Resistance Training: - Gives group verbal and written discussion on the health impact of inactivity. On the components of aerobic and resistive training programs and the benefits of this training and how to safely progress through these programs.   Flexibility, Balance, General Exercise Guidelines: - Provides group verbal and written instruction on the benefits of flexibility and balance training programs. Provides general exercise guidelines with specific guidelines to those with heart or lung disease. Demonstration and skill practice provided.   Stress Management: - Provides group verbal and written instruction about the health risks of elevated stress, cause of high stress, and healthy ways to reduce stress.   Depression: - Provides group verbal and written instruction on the correlation between heart/lung disease and depressed mood, treatment options, and the stigmas associated with seeking treatment.   Exercise & Equipment Safety: - Individual verbal instruction and demonstration of equipment use and safety with use of the equipment.   Pulmonary Rehab from 09/11/2017 in [facility] Cardiac and Pulmonary Rehab  Date  09/11/17  Educator  [facility]  Instruction Review Code  1- Verbalizes Understanding      Infection Prevention: - Provides verbal and written material to individual with discussion  of infection control including proper hand washing and proper equipment cleaning during exercise session.   Pulmonary Rehab from 09/11/2017 in [facility] Cardiac and Pulmonary Rehab  Date  09/11/17    Educator  [facility]  Instruction Review Code  1- Verbalizes Understanding      Falls Prevention: - Provides verbal and written material to individual with discussion of falls prevention and safety.   Pulmonary Rehab from 09/11/2017 in [facility] Cardiac and Pulmonary Rehab  Date  09/11/17  Educator  [facility]  Instruction Review Code  1- Verbalizes Understanding      Diabetes: - Individual verbal and written instruction to review signs/symptoms of diabetes, desired ranges of glucose level fasting, after meals and with exercise. Advice that pre and post exercise glucose checks will be done for 3 sessions at entry of program.   Chronic Lung Diseases: - Group verbal and written instruction to review new updates, new respiratory medications, new advancements in procedures and treatments. Provide informative websites and "800" numbers of self-education.   Lung Procedures: - Group verbal and written instruction to describe testing methods done to diagnose lung disease. Review the outcome of test results. Describe the treatment choices: Pulmonary Function Tests, ABGs and oximetry.   Energy Conservation: - Provide group verbal and written instruction for methods to conserve energy, plan and organize activities. Instruct on pacing techniques, use of adaptive equipment and posture/positioning to relieve shortness of breath.   Triggers: - Group verbal and written instruction to review types of environmental controls: home humidity, furnaces, filters, dust mite/pet prevention, HEPA vacuums. To discuss weather changes, air quality and the benefits of nasal washing.   Exacerbations: - Group verbal and written instruction to provide: warning signs, infection symptoms, calling MD promptly, preventive modes, and value of vaccinations. Review: effective airway clearance, coughing and/or vibration techniques. Create an Sport and exercise psychologist.   Oxygen: - Individual and group verbal and written instruction on oxygen therapy.  Includes supplement oxygen, available portable oxygen systems, continuous and intermittent flow rates, oxygen safety, concentrators, and Medicare reimbursement for oxygen.   Respiratory Medications: - Group verbal and written instruction to review medications for lung disease. Drug class, frequency, complications, importance of spacers, rinsing mouth after steroid MDI's, and proper cleaning methods for nebulizers.   AED/CPR: - Group verbal and written instruction with the use of models to demonstrate the basic use of the AED with the basic ABC's of resuscitation.   Breathing Retraining: - Provides individuals verbal and written instruction on purpose, frequency, and proper technique of diaphragmatic breathing and pursed-lipped breathing. Applies individual practice skills.   Pulmonary Rehab from 09/11/2017 in [facility]  Date  09/11/17  Educator  [facility]  Instruction Review Code  1- Verbalizes Understanding      Anatomy and Physiology of the Lungs: - Group verbal and written instruction with the use of models to provide basic lung anatomy and physiology related to function, structure and complications of lung disease.   Anatomy & Physiology of the Heart: - Group verbal and written instruction and models provide basic cardiac anatomy and physiology, with the coronary electrical and arterial systems. Review of: AMI, Angina, Valve disease, Heart Failure, Cardiac Arrhythmia, Pacemakers, and the ICD.   Heart Failure: - Group verbal and written instruction on the basics of heart failure: signs/symptoms, treatments, explanation of ejection fraction, enlarged heart and cardiomyopathy.   Sleep Apnea: - Individual verbal and written instruction to review Obstructive Sleep Apnea. Review of risk factors, methods for diagnosing and types of masks and  machines for OSA.   Anxiety: - Provides group, verbal and written instruction on the correlation between heart/lung disease and  anxiety, treatment options, and management of anxiety.   Relaxation: - Provides group, verbal and written instruction about the benefits of relaxation for patients with heart/lung disease. Also provides patients with examples of relaxation techniques.   Cardiac Medications: - Group verbal and written instruction to review commonly prescribed medications for heart disease. Reviews the medication, class of the drug, and side effects.   Know Your Numbers: -Group verbal and written instruction about important numbers in your health.  Review of Cholesterol, Blood Pressure, Diabetes, and BMI and the role they play in your overall health.   Other: -Provides group and verbal instruction on various topics (see comments)    Knowledge Questionnaire Score: Knowledge Questionnaire Score - 09/11/17 1412      Knowledge Questionnaire Score   Pre Score  13/18 reviewed with patient        Core Components/Risk Factors/Patient Goals at Admission: Personal Goals and Risk Factors at Admission - 09/11/17 1422      Core Components/Risk Factors/Patient Goals on Admission    Weight Management  Yes;Weight Loss    Intervention  Weight Management: Develop a combined nutrition and exercise program designed to reach desired caloric intake, while maintaining appropriate intake of nutrient and fiber, sodium and fats, and appropriate energy expenditure required for the weight goal.;Weight Management: Provide education and appropriate resources to help participant work on and attain dietary goals.;Weight Management/Obesity: Establish reasonable short term and long term weight goals.    Admit Weight  196 lb 11.2 oz (89.2 kg)    Goal Weight: Short Term  191 lb (86.6 kg)    Goal Weight: Long Term  180 lb (81.6 kg)    Expected Outcomes  Short Term: Continue to assess and modify interventions until short term weight is achieved;Long Term: Adherence to nutrition and physical activity/exercise program aimed toward  attainment of established weight goal;Weight Loss: Understanding of general recommendations for a balanced deficit meal plan, which promotes 1-2 lb weight loss per week and includes a negative energy balance of [phone] kcal/d;Understanding recommendations for meals to include 15-35% energy as protein, 25-35% energy from fat, 35-60% energy from carbohydrates, less than 200mg  of dietary cholesterol, 20-35 gm of total fiber daily;Understanding of distribution of calorie intake throughout the day with the consumption of 4-5 meals/snacks    Tobacco Cessation  Yes    Number of packs per day  1 pack a day    Intervention  Assist the participant in steps to quit. Provide individualized education and counseling about committing to Tobacco Cessation, relapse prevention, and pharmacological support that can be provided by physician.;Education officer, environmental, assist with locating and accessing local/national Quit Smoking programs, and support quit date choice.    Expected Outcomes  Short Term: Will demonstrate readiness to quit, by selecting a quit date.;Short Term: Will quit all tobacco product use, adhering to prevention of relapse plan.;Long Term: Complete abstinence from all tobacco products for at least 12 months from quit date.    Improve shortness of breath with ADL's  Yes    Intervention  Provide education, individualized exercise plan and daily activity instruction to help decrease symptoms of SOB with activities of daily living.    Expected Outcomes  Short Term: Achieves a reduction of symptoms when performing activities of daily living.    Diabetes  Yes    Intervention  Provide education about signs/symptoms and action to take for  hypo/hyperglycemia.;Provide education about proper nutrition, including hydration, and aerobic/resistive exercise prescription along with prescribed medications to achieve blood glucose in normal ranges: Fasting glucose 65-99 mg/dL    Expected Outcomes  Short Term:  Participant verbalizes understanding of the signs/symptoms and immediate care of hyper/hypoglycemia, proper foot care and importance of medication, aerobic/resistive exercise and nutrition plan for blood glucose control.;Long Term: Attainment of HbA1C < 7%.    Heart Failure  Yes    Intervention  Provide a combined exercise and nutrition program that is supplemented with education, support and counseling about heart failure. Directed toward relieving symptoms such as shortness of breath, decreased exercise tolerance, and extremity edema.    Expected Outcomes  Improve functional capacity of life;Short term: Attendance in program 2-3 days a week with increased exercise capacity. Reported lower sodium intake. Reported increased fruit and vegetable intake. Reports medication compliance.;Short term: Daily weights obtained and reported for increase. Utilizing diuretic protocols set by physician.;Long term: Adoption of self-care skills and reduction of barriers for early signs and symptoms recognition and intervention leading to self-care maintenance.    Hypertension  Yes    Intervention  Provide education on lifestyle modifcations including regular physical activity/exercise, weight management, moderate sodium restriction and increased consumption of fresh fruit, vegetables, and low fat dairy, alcohol moderation, and smoking cessation.;Monitor prescription use compliance.    Expected Outcomes  Short Term: Continued assessment and intervention until BP is < 140/55mm HG in hypertensive participants. < 130/47mm HG in hypertensive participants with diabetes, heart failure or chronic kidney disease.;Long Term: Maintenance of blood pressure at goal levels.       Core Components/Risk Factors/Patient Goals Review:    Core Components/Risk Factors/Patient Goals at Discharge (Final Review):    ITP Comments: ITP Comments    Row Name 09/11/17 1350           ITP Comments  Medical Evaluation completed. Chart sent  for review and changes to Dr. Bethann Punches Director of [facility]. Diagnosis can be found in [facility] encounter 09/11/17          Comments: Initial ITP

## 2017-09-11 NOTE — Patient Instructions (Signed)
Patient Instructions  Patient Details  Name: CHRISTINE SCHIEFELBEIN MRN: [id number] Date of Birth: 1955/11/07 Referring Provider:  Rayetta Humphrey, MD  Below are the personal goals you chose as well as exercise and nutrition goals. Our goal is to help you keep on track towards obtaining and maintaining your goals. We will be discussing your progress on these goals with you throughout the program.  Initial Exercise Prescription: Initial Exercise Prescription - 09/11/17 1500      Date of Initial Exercise RX and Referring Provider   Date  09/11/17    Referring Provider  Angus Palms MD      Oxygen   Oxygen  Continuous    Liters  2      Treadmill   MPH  1    Grade  0    Minutes  15    METs  1.77      NuStep   Level  1    SPM  80    Minutes  15    METs  1.7      Arm Ergometer   Level  1    Watts  22    RPM  25    Minutes  15    METs  1.7      Prescription Details   Frequency (times per week)  3    Duration  Progress to 45 minutes of aerobic exercise without signs/symptoms of physical distress      Intensity   THRR 40-80% of Max Heartrate  103-139    Ratings of Perceived Exertion  11-15    Perceived Dyspnea  0-4      Progression   Progression  Continue to progress workloads to maintain intensity without signs/symptoms of physical distress.      Resistance Training   Training Prescription  Yes    Weight  3 lbs    Reps  10-15       Exercise Goals: Frequency: Be able to perform aerobic exercise three times per week working toward 3-5 days per week.  Intensity: Work with a perceived exertion of 11 (fairly light) - 15 (hard) as tolerated. Follow your new exercise prescription and watch for changes in prescription as you progress with the program. Changes will be reviewed with you when they are made.  Duration: You should be able to do 30 minutes of continuous aerobic exercise in addition to a 5 minute warm-up and a 5 minute cool-down routine.  Nutrition Goals: Your  personal nutrition goals will be established when you do your nutrition analysis with the dietician.  The following are nutrition guidelines to follow: Cholesterol < 200mg /day Sodium < 1500mg /day Fiber: Men over 50 yrs - 30 grams per day  Personal Goals: Personal Goals and Risk Factors at Admission - 09/11/17 1422      Core Components/Risk Factors/Patient Goals on Admission    Weight Management  Yes;Weight Loss    Intervention  Weight Management: Develop a combined nutrition and exercise program designed to reach desired caloric intake, while maintaining appropriate intake of nutrient and fiber, sodium and fats, and appropriate energy expenditure required for the weight goal.;Weight Management: Provide education and appropriate resources to help participant work on and attain dietary goals.;Weight Management/Obesity: Establish reasonable short term and long term weight goals.    Admit Weight  196 lb 11.2 oz (89.2 kg)    Goal Weight: Short Term  191 lb (86.6 kg)    Goal Weight: Long Term  180 lb (81.6 kg)  Expected Outcomes  Short Term: Continue to assess and modify interventions until short term weight is achieved;Long Term: Adherence to nutrition and physical activity/exercise program aimed toward attainment of established weight goal;Weight Loss: Understanding of general recommendations for a balanced deficit meal plan, which promotes 1-2 lb weight loss per week and includes a negative energy balance of [phone] kcal/d;Understanding recommendations for meals to include 15-35% energy as protein, 25-35% energy from fat, 35-60% energy from carbohydrates, less than 200mg  of dietary cholesterol, 20-35 gm of total fiber daily;Understanding of distribution of calorie intake throughout the day with the consumption of 4-5 meals/snacks    Tobacco Cessation  Yes    Number of packs per day  1 pack a day    Intervention  Assist the participant in steps to quit. Provide individualized education and  counseling about committing to Tobacco Cessation, relapse prevention, and pharmacological support that can be provided by physician.;Education officer, environmental, assist with locating and accessing local/national Quit Smoking programs, and support quit date choice.    Expected Outcomes  Short Term: Will demonstrate readiness to quit, by selecting a quit date.;Short Term: Will quit all tobacco product use, adhering to prevention of relapse plan.;Long Term: Complete abstinence from all tobacco products for at least 12 months from quit date.    Improve shortness of breath with ADL's  Yes    Intervention  Provide education, individualized exercise plan and daily activity instruction to help decrease symptoms of SOB with activities of daily living.    Expected Outcomes  Short Term: Achieves a reduction of symptoms when performing activities of daily living.    Diabetes  Yes    Intervention  Provide education about signs/symptoms and action to take for hypo/hyperglycemia.;Provide education about proper nutrition, including hydration, and aerobic/resistive exercise prescription along with prescribed medications to achieve blood glucose in normal ranges: Fasting glucose 65-99 mg/dL    Expected Outcomes  Short Term: Participant verbalizes understanding of the signs/symptoms and immediate care of hyper/hypoglycemia, proper foot care and importance of medication, aerobic/resistive exercise and nutrition plan for blood glucose control.;Long Term: Attainment of HbA1C < 7%.    Heart Failure  Yes    Intervention  Provide a combined exercise and nutrition program that is supplemented with education, support and counseling about heart failure. Directed toward relieving symptoms such as shortness of breath, decreased exercise tolerance, and extremity edema.    Expected Outcomes  Improve functional capacity of life;Short term: Attendance in program 2-3 days a week with increased exercise capacity. Reported lower sodium  intake. Reported increased fruit and vegetable intake. Reports medication compliance.;Short term: Daily weights obtained and reported for increase. Utilizing diuretic protocols set by physician.;Long term: Adoption of self-care skills and reduction of barriers for early signs and symptoms recognition and intervention leading to self-care maintenance.    Hypertension  Yes    Intervention  Provide education on lifestyle modifcations including regular physical activity/exercise, weight management, moderate sodium restriction and increased consumption of fresh fruit, vegetables, and low fat dairy, alcohol moderation, and smoking cessation.;Monitor prescription use compliance.    Expected Outcomes  Short Term: Continued assessment and intervention until BP is < 140/45mm HG in hypertensive participants. < 130/62mm HG in hypertensive participants with diabetes, heart failure or chronic kidney disease.;Long Term: Maintenance of blood pressure at goal levels.       Tobacco Use Initial Evaluation: Social History   Tobacco Use  Smoking Status Current Every Day Smoker  . Packs/day: 1.00  . Years: 40.00  . Pack years:  40.00  . Types: Cigarettes  Smokeless Tobacco Never Used  Tobacco Comment   he is interested but doesnt want to quit due to him mother passing away    Exercise Goals and Review: Exercise Goals    Row Name 09/11/17 1544             Exercise Goals   Increase Physical Activity  Yes       Intervention  Provide advice, education, support and counseling about physical activity/exercise needs.;Develop an individualized exercise prescription for aerobic and resistive training based on initial evaluation findings, risk stratification, comorbidities and participant's personal goals.       Expected Outcomes  Achievement of increased cardiorespiratory fitness and enhanced flexibility, muscular endurance and strength shown through measurements of functional capacity and personal statement of  participant.       Increase Strength and Stamina  Yes       Intervention  Provide advice, education, support and counseling about physical activity/exercise needs.;Develop an individualized exercise prescription for aerobic and resistive training based on initial evaluation findings, risk stratification, comorbidities and participant's personal goals.       Expected Outcomes  Achievement of increased cardiorespiratory fitness and enhanced flexibility, muscular endurance and strength shown through measurements of functional capacity and personal statement of participant.       Able to understand and use rate of perceived exertion (RPE) scale  Yes       Intervention  Provide education and explanation on how to use RPE scale       Expected Outcomes  Short Term: Able to use RPE daily in rehab to express subjective intensity level;Long Term:  Able to use RPE to guide intensity level when exercising independently       Able to understand and use Dyspnea scale  Yes       Intervention  Provide education and explanation on how to use Dyspnea scale       Expected Outcomes  Short Term: Able to use Dyspnea scale daily in rehab to express subjective sense of shortness of breath during exertion;Long Term: Able to use Dyspnea scale to guide intensity level when exercising independently       Knowledge and understanding of Target Heart Rate Range (THRR)  Yes       Intervention  Provide education and explanation of THRR including how the numbers were predicted and where they are located for reference       Expected Outcomes  Short Term: Able to state/look up THRR;Long Term: Able to use THRR to govern intensity when exercising independently;Short Term: Able to use daily as guideline for intensity in rehab       Able to check pulse independently  Yes       Intervention  Provide education and demonstration on how to check pulse in carotid and radial arteries.;Review the importance of being able to check your own pulse for  safety during independent exercise       Expected Outcomes  Short Term: Able to explain why pulse checking is important during independent exercise;Long Term: Able to check pulse independently and accurately       Understanding of Exercise Prescription  Yes       Intervention  Provide education, explanation, and written materials on patient's individual exercise prescription       Expected Outcomes  Short Term: Able to explain program exercise prescription;Long Term: Able to explain home exercise prescription to exercise independently          Copy  of goals given to participant.

## 2017-09-18 DIAGNOSIS — J449 Chronic obstructive pulmonary disease, unspecified: Secondary | ICD-10-CM | POA: Diagnosis not present

## 2017-09-18 LAB — GLUCOSE, CAPILLARY
GLUCOSE-CAPILLARY: 88 mg/dL (ref 65–99)
Glucose-Capillary: 118 mg/dL — ABNORMAL HIGH (ref 65–99)

## 2017-09-18 NOTE — Progress Notes (Signed)
Pulmonary Individual Treatment Plan  Patient Details  Name: Kyle Rich MRN: [id number] Date of Birth: 02/15/1956 Referring Provider:     Pulmonary Rehab from 09/11/2017 in [facility] Cardiac and Pulmonary Rehab  Referring Provider  Salome Holmes MD      Initial Encounter Date:    Pulmonary Rehab from 09/11/2017 in [facility] Cardiac and Pulmonary Rehab  Date  09/11/17  Referring Provider  Salome Holmes MD      Visit Diagnosis: Chronic obstructive pulmonary disease, unspecified COPD type ([place])  Patient's Home Medications on Admission:  Current Outpatient Medications:  .  albuterol (PROAIR HFA) 108 (90 BASE) MCG/ACT inhaler, Inhale 2 puffs into the lungs every 6 (six) hours as needed for wheezing. , Disp: , Rfl:  .  ALPRAZolam (XANAX) 0.25 MG tablet, Take 1 tablet (0.25 mg total) 2 (two) times daily as needed by mouth for anxiety., Disp: 20 tablet, Rfl: 0 .  aspirin EC 325 MG EC tablet, Take 1 tablet (325 mg total) by mouth daily., Disp: 30 tablet, Rfl: 0 .  atorvastatin (LIPITOR) 40 MG tablet, Take 1 tablet (40 mg total) by mouth daily at 6 PM., Disp: , Rfl:  .  chlorpheniramine-HYDROcodone (TUSSIONEX) 10-8 MG/5ML SUER, Take 5 mLs every 12 (twelve) hours by mouth., Disp: 115 mL, Rfl: 0 .  escitalopram (LEXAPRO) 20 MG tablet, Take 1 tablet (20 mg total) at bedtime by mouth., Disp: 30 tablet, Rfl: 2 .  Fluticasone-Salmeterol (ADVAIR DISKUS) 500-50 MCG/DOSE AEPB, Inhale 1 puff every 12 (twelve) hours into the lungs., Disp: 60 each, Rfl: 2 .  ipratropium-albuterol (DUONEB) 0.5-2.5 (3) MG/3ML SOLN, Take 3 mLs every 6 (six) hours as needed by nebulization (wheezing)., Disp: 360 mL, Rfl: 1 .  metFORMIN (GLUCOPHAGE) 500 MG tablet, Take by mouth 2 (two) times daily with a meal., Disp: , Rfl:  .  metoprolol tartrate (LOPRESSOR) 25 MG tablet, Take 1 tablet (25 mg total) by mouth 2 (two) times daily., Disp: , Rfl:  .  nicotine (NICODERM CQ - DOSED IN MG/24 HOURS) 14 mg/24hr patch, Place 1 patch (14 mg total)  daily onto the skin., Disp: 28 patch, Rfl: 0 .  pantoprazole (PROTONIX) 40 MG tablet, Take 1 tablet (40 mg total) daily by mouth., Disp: 30 tablet, Rfl: 2 .  predniSONE (STERAPRED UNI-PAK 21 TAB) 10 MG (21) TBPK tablet, 6 tabs PO x 1 day 5 tabs PO x 1 day 4 tabs PO x 1 day 3 tabs PO x 1 day 2 tabs PO x 1 day 1 tab PO x 1 day and stop, Disp: 21 tablet, Rfl: 0 .  SUBOXONE 8-2 MG FILM, Place 1 Film (8 mg total) under the tongue 2 (two) times daily NADEAN [id number], Disp: , Rfl: 0 .  tiotropium (SPIRIVA HANDIHALER) 18 MCG inhalation capsule, Place 1 capsule (18 mcg total) daily into inhaler and inhale., Disp: 30 capsule, Rfl: 2  Past Medical History: Past Medical History:  Diagnosis Date  . Arthritis   . Asthma   . CHF (congestive heart failure) ([place])   . COPD (chronic obstructive pulmonary disease) ([place])   . Diabetes mellitus without complication ([place])   . Hepatitis C   . Hypertension     Tobacco Use: Social History   Tobacco Use  Smoking Status Current Every Day Smoker  . Packs/day: 1.00  . Years: 40.00  . Pack years: 40.00  . Types: Cigarettes  Smokeless Tobacco Never Used  Tobacco Comment   he is interested but doesnt want to quit due to him  mother passing away    Labs: Recent Review Flowsheet Data    Labs for ITP Cardiac and Pulmonary Rehab Latest Ref Rng & Units 06/09/2015 06/15/2016 07/02/2017 07/03/2017 07/19/2017   Cholestrol 0 - 200 mg/dL - - - - -   LDLCALC 0 - 100 mg/dL - - - - -   HDL 40 - 60 mg/dL - - - - -   Trlycerides 0 - 200 mg/dL - - - - -   Hemoglobin A1c 4.8 - 5.6 % - - - 6.6(H) -   PHART 7.350 - 7.450 - - 7.41 7.38 -   PCO2ART 32.0 - 48.0 mmHg - - 50(H) 48 -   HCO3 20.0 - 28.0 mmol/L 33.1(H) 28.9(H) 31.7(H) 28.4(H) 40.3(H)   O2SAT % - 92.7 98.3 93.4 96.1       Pulmonary Assessment Scores: Pulmonary Assessment Scores    Row Name 09/11/17 1410         ADL UCSD   ADL Phase  Entry     SOB Score total  84     Rest  3     Walk  3     Stairs  5       Bath  4     Dress  3     Shop  4       CAT Score   CAT Score  27       mMRC Score   mMRC Score  3        Pulmonary Function Assessment: Pulmonary Function Assessment - 09/11/17 1428      Initial Spirometry Results   FVC%  33 %    FEV1%  27 %    FEV1/FVC Ratio  60.27    Comments  Best of 2, good patient effort      Post Bronchodilator Spirometry Results   FVC%  38.94 %    FEV1%  32.94 %    FEV1/FVC Ratio  62.12    Comments  Best of 2, good patient effort      Breath   Bilateral Breath Sounds  Wheezes    Shortness of Breath  Limiting activity;Fear of Shortness of Breath;Panic with Shortness of Breath;Yes       Exercise Target Goals:    Exercise Program Goal: Individual exercise prescription set with THRR, safety & activity barriers. Participant demonstrates ability to understand and report RPE using BORG scale, to self-measure pulse accurately, and to acknowledge the importance of the exercise prescription.  Exercise Prescription Goal: Starting with aerobic activity 30 plus minutes a day, 3 days per week for initial exercise prescription. Provide home exercise prescription and guidelines that participant acknowledges understanding prior to discharge.  Activity Barriers & Risk Stratification: Activity Barriers & Cardiac Risk Stratification - 09/11/17 1539      Activity Barriers & Cardiac Risk Stratification   Activity Barriers  Deconditioning;Muscular Weakness;Shortness of Breath;Joint Problems;Balance Concerns bilateral hip pain       6 Minute Walk: 6 Minute Walk    Row Name 09/11/17 1530         6 Minute Walk   Phase  Initial     Distance  570 feet     Walk Time  4.65 minutes     # of Rest Breaks  2 1:20, stopped at 5:01     MPH  1.39     METS  1.59     RPE  13     Perceived Dyspnea   2  VO2 Peak  5.55     Symptoms  Yes (comment)     Comments  leg fatigue/heaviness/pain 10/10     Resting HR  67 bpm     Resting BP  146/76     Resting Oxygen  Saturation   94 %     Exercise Oxygen Saturation  during 6 min walk  93 %     Max Ex. HR  85 bpm     Max Ex. BP  154/74     2 Minute Post BP  144/70       Interval HR   1 Minute HR  70     2 Minute HR  74     3 Minute HR  74     4 Minute HR  78     5 Minute HR  85     6 Minute HR  75     2 Minute Post HR  66     Interval Heart Rate?  Yes       Interval Oxygen   Interval Oxygen?  Yes     Baseline Oxygen Saturation %  94 %     1 Minute Oxygen Saturation %  93 %     1 Minute Liters of Oxygen  2 L     2 Minute Oxygen Saturation %  94 %     2 Minute Liters of Oxygen  2 L     3 Minute Oxygen Saturation %  94 %     3 Minute Liters of Oxygen  2 L     4 Minute Oxygen Saturation %  95 %     4 Minute Liters of Oxygen  2 L     5 Minute Oxygen Saturation %  95 %     5 Minute Liters of Oxygen  2 L     6 Minute Oxygen Saturation %  95 %     6 Minute Liters of Oxygen  2 L     2 Minute Post Oxygen Saturation %  96 %     2 Minute Post Liters of Oxygen  2 L       Oxygen Initial Assessment: Oxygen Initial Assessment - 09/11/17 1419      Home Oxygen   Home Oxygen Device  Home Concentrator;E-Tanks    Sleep Oxygen Prescription  Continuous    Liters per minute  2    Home Exercise Oxygen Prescription  Continuous    Liters per minute  2    Home at Rest Exercise Oxygen Prescription  Continuous    Liters per minute  2    Compliance with Home Oxygen Use  Yes      Initial 6 min Walk   Oxygen Used  Continuous    Liters per minute  2      Program Oxygen Prescription   Program Oxygen Prescription  Continuous    Liters per minute  2      Intervention   Short Term Goals  To learn and demonstrate proper use of respiratory medications;To learn and demonstrate proper pursed lip breathing techniques or other breathing techniques.;To learn and understand importance of monitoring SPO2 with pulse oximeter and demonstrate accurate use of the pulse oximeter.;To learn and understand importance of  maintaining oxygen saturations>88%;To learn and exhibit compliance with exercise, home and travel O2 prescription takes albuterol nebulizer, pro air inhaler. Advair and spiriva    Long  Term Goals  Exhibits compliance with exercise, home and  travel O2 prescription;Verbalizes importance of monitoring SPO2 with pulse oximeter and return demonstration;Maintenance of O2 saturations>88%;Exhibits proper breathing techniques, such as pursed lip breathing or other method taught during program session;Compliance with respiratory medication;Demonstrates proper use of MDI's       Oxygen Re-Evaluation:   Oxygen Discharge (Final Oxygen Re-Evaluation):   Initial Exercise Prescription: Initial Exercise Prescription - 09/11/17 1500      Date of Initial Exercise RX and Referring Provider   Date  09/11/17    Referring Provider  Angus Palms MD      Oxygen   Oxygen  Continuous    Liters  2      Treadmill   MPH  1    Grade  0    Minutes  15    METs  1.77      NuStep   Level  1    SPM  80    Minutes  15    METs  1.7      Arm Ergometer   Level  1    Watts  22    RPM  25    Minutes  15    METs  1.7      Prescription Details   Frequency (times per week)  3    Duration  Progress to 45 minutes of aerobic exercise without signs/symptoms of physical distress      Intensity   THRR 40-80% of Max Heartrate  103-139    Ratings of Perceived Exertion  11-15    Perceived Dyspnea  0-4      Progression   Progression  Continue to progress workloads to maintain intensity without signs/symptoms of physical distress.      Resistance Training   Training Prescription  Yes    Weight  3 lbs    Reps  10-15       Perform Capillary Blood Glucose checks as needed.  Exercise Prescription Changes: Exercise Prescription Changes    Row Name 09/11/17 1500             Response to Exercise   Blood Pressure (Admit)  146/76       Blood Pressure (Exercise)  154/74       Blood Pressure (Exit)  144/70        Heart Rate (Admit)  67 bpm       Heart Rate (Exercise)  85 bpm       Heart Rate (Exit)  66 bpm       Oxygen Saturation (Admit)  94 %       Oxygen Saturation (Exercise)  93 %       Oxygen Saturation (Exit)  96 %       Rating of Perceived Exertion (Exercise)  13       Perceived Dyspnea (Exercise)  2       Symptoms  leg fatigue/heaviness/pain 10/10       Comments  walk test results          Exercise Comments:   Exercise Goals and Review: Exercise Goals    Row Name 09/11/17 1544             Exercise Goals   Increase Physical Activity  Yes       Intervention  Provide advice, education, support and counseling about physical activity/exercise needs.;Develop an individualized exercise prescription for aerobic and resistive training based on initial evaluation findings, risk stratification, comorbidities and participant's personal goals.       Expected Outcomes  Achievement of  increased cardiorespiratory fitness and enhanced flexibility, muscular endurance and strength shown through measurements of functional capacity and personal statement of participant.       Increase Strength and Stamina  Yes       Intervention  Provide advice, education, support and counseling about physical activity/exercise needs.;Develop an individualized exercise prescription for aerobic and resistive training based on initial evaluation findings, risk stratification, comorbidities and participant's personal goals.       Expected Outcomes  Achievement of increased cardiorespiratory fitness and enhanced flexibility, muscular endurance and strength shown through measurements of functional capacity and personal statement of participant.       Able to understand and use rate of perceived exertion (RPE) scale  Yes       Intervention  Provide education and explanation on how to use RPE scale       Expected Outcomes  Short Term: Able to use RPE daily in rehab to express subjective intensity level;Long Term:  Able to  use RPE to guide intensity level when exercising independently       Able to understand and use Dyspnea scale  Yes       Intervention  Provide education and explanation on how to use Dyspnea scale       Expected Outcomes  Short Term: Able to use Dyspnea scale daily in rehab to express subjective sense of shortness of breath during exertion;Long Term: Able to use Dyspnea scale to guide intensity level when exercising independently       Knowledge and understanding of Target Heart Rate Range (THRR)  Yes       Intervention  Provide education and explanation of THRR including how the numbers were predicted and where they are located for reference       Expected Outcomes  Short Term: Able to state/look up THRR;Long Term: Able to use THRR to govern intensity when exercising independently;Short Term: Able to use daily as guideline for intensity in rehab       Able to check pulse independently  Yes       Intervention  Provide education and demonstration on how to check pulse in carotid and radial arteries.;Review the importance of being able to check your own pulse for safety during independent exercise       Expected Outcomes  Short Term: Able to explain why pulse checking is important during independent exercise;Long Term: Able to check pulse independently and accurately       Understanding of Exercise Prescription  Yes       Intervention  Provide education, explanation, and written materials on patient's individual exercise prescription       Expected Outcomes  Short Term: Able to explain program exercise prescription;Long Term: Able to explain home exercise prescription to exercise independently          Exercise Goals Re-Evaluation :   Discharge Exercise Prescription (Final Exercise Prescription Changes): Exercise Prescription Changes - 09/11/17 1500      Response to Exercise   Blood Pressure (Admit)  146/76    Blood Pressure (Exercise)  154/74    Blood Pressure (Exit)  144/70    Heart Rate  (Admit)  67 bpm    Heart Rate (Exercise)  85 bpm    Heart Rate (Exit)  66 bpm    Oxygen Saturation (Admit)  94 %    Oxygen Saturation (Exercise)  93 %    Oxygen Saturation (Exit)  96 %    Rating of Perceived Exertion (Exercise)  13  Perceived Dyspnea (Exercise)  2    Symptoms  leg fatigue/heaviness/pain 10/10    Comments  walk test results       Nutrition:  Target Goals: Understanding of nutrition guidelines, daily intake of sodium 1500mg , cholesterol 200mg , calories 30% from fat and 7% or less from saturated fats, daily to have 5 or more servings of fruits and vegetables.  Biometrics: Pre Biometrics - 09/11/17 1545      Pre Biometrics   Height  5' 3.5" (1.613 m)    Weight  196 lb 11.2 oz (89.2 kg)    Waist Circumference  43 inches    Hip Circumference  42 inches    Waist to Hip Ratio  1.02 %    BMI (Calculated)  34.29        Nutrition Therapy Plan and Nutrition Goals: Nutrition Therapy & Goals - 09/11/17 1409      Personal Nutrition Goals   Comments  Patients wants to lose weight and eat healthier. He would like to meet with the dietician.      Intervention Plan   Intervention  Prescribe, educate and counsel regarding individualized specific dietary modifications aiming towards targeted core components such as weight, hypertension, lipid management, diabetes, heart failure and other comorbidities.;Nutrition handout(s) given to patient.    Expected Outcomes  Short Term Goal: Understand basic principles of dietary content, such as calories, fat, sodium, cholesterol and nutrients.;Long Term Goal: Adherence to prescribed nutrition plan.       Nutrition Discharge: Rate Your Plate Scores:   Nutrition Goals Re-Evaluation:   Nutrition Goals Discharge (Final Nutrition Goals Re-Evaluation):   Psychosocial: Target Goals: Acknowledge presence or absence of significant depression and/or stress, maximize coping skills, provide positive support system. Participant is able to  verbalize types and ability to use techniques and skills needed for reducing stress and depression.   Initial Review & Psychosocial Screening: Initial Psych Review & Screening - 09/11/17 1404      Initial Review   Current issues with  Current Depression;History of Depression;Current Psychotropic Meds;Current Sleep Concerns;Current Stress Concerns    Source of Stress Concerns  Chronic Illness;Family;Unable to perform yard/household activities;Unable to participate in former interests or hobbies    Comments  His mother died about three weeks ago and it has  been really hard on him.      Family Dynamics   Good Support System?  No    Strains  Intra-family strains    Comments  He blames his sister for putting his mom in a home. He feels like she was not getting the care she needed when she was in the home and thats what killed her.      Barriers   Psychosocial barriers to participate in program  The patient should benefit from training in stress management and relaxation.      Screening Interventions   Interventions  Yes;Encouraged to exercise;Program counselor consult;To provide support and resources with identified psychosocial needs;Provide feedback about the scores to participant    Expected Outcomes  Short Term goal: Utilizing psychosocial counselor, staff and physician to assist with identification of specific Stressors or current issues interfering with healing process. Setting desired goal for each stressor or current issue identified.;Long Term Goal: Stressors or current issues are controlled or eliminated.;Short Term goal: Identification and review with participant of any Quality of Life or Depression concerns found by scoring the questionnaire.;Long Term goal: The participant improves quality of Life and PHQ9 Scores as seen by post scores and/or verbalization of changes  Quality of Life Scores:   PHQ-9: Recent Review Flowsheet Data    Depression screen [facility] 2/9 09/11/2017    Decreased Interest 2   Down, Depressed, Hopeless 2   PHQ - 2 Score 4   Altered sleeping 3   Tired, decreased energy 3   Change in appetite 2   Feeling bad or failure about yourself  3   Trouble concentrating 2   Moving slowly or fidgety/restless 1   Suicidal thoughts 2    PHQ-9 Score 20   Difficult doing work/chores Somewhat difficult     Interpretation of Total Score  Total Score Depression Severity:  1-4 = Minimal depression, 5-9 = Mild depression, 10-14 = Moderate depression, 15-19 = Moderately severe depression, 20-27 = Severe depression   Psychosocial Evaluation and Intervention:   Psychosocial Re-Evaluation:   Psychosocial Discharge (Final Psychosocial Re-Evaluation):   Education: Education Goals: Education classes will be provided on a weekly basis, covering required topics. Participant will state understanding/return demonstration of topics presented.  Learning Barriers/Preferences: Learning Barriers/Preferences - 09/11/17 1412      Learning Barriers/Preferences   Learning Barriers  None    Learning Preferences  None       Education Topics: Initial Evaluation Education: - Verbal, written and demonstration of respiratory meds, RPE/PD scales, oximetry and breathing techniques. Instruction on use of nebulizers and MDIs: cleaning and proper use, rinsing mouth with steroid doses and importance of monitoring MDI activations.   Pulmonary Rehab from 09/11/2017 in [facility] Cardiac and Pulmonary Rehab  Date  09/11/17  Educator  [facility]  Instruction Review Code  1- Verbalizes Understanding      General Nutrition Guidelines/Fats and Fiber: -Group instruction provided by verbal, written material, models and posters to present the general guidelines for heart healthy nutrition. Gives an explanation and review of dietary fats and fiber.   Controlling Sodium/Reading Food Labels: -Group verbal and written material supporting the discussion of sodium use in heart healthy nutrition.  Review and explanation with models, verbal and written materials for utilization of the food label.   Exercise Physiology & Risk Factors: - Group verbal and written instruction with models to review the exercise physiology of the cardiovascular system and associated critical values. Details cardiovascular disease risk factors and the goals associated with each risk factor.   Aerobic Exercise & Resistance Training: - Gives group verbal and written discussion on the health impact of inactivity. On the components of aerobic and resistive training programs and the benefits of this training and how to safely progress through these programs.   Flexibility, Balance, General Exercise Guidelines: - Provides group verbal and written instruction on the benefits of flexibility and balance training programs. Provides general exercise guidelines with specific guidelines to those with heart or lung disease. Demonstration and skill practice provided.   Stress Management: - Provides group verbal and written instruction about the health risks of elevated stress, cause of high stress, and healthy ways to reduce stress.   Depression: - Provides group verbal and written instruction on the correlation between heart/lung disease and depressed mood, treatment options, and the stigmas associated with seeking treatment.   Exercise & Equipment Safety: - Individual verbal instruction and demonstration of equipment use and safety with use of the equipment.   Pulmonary Rehab from 09/11/2017 in [facility] Cardiac and Pulmonary Rehab  Date  09/11/17  Educator  [facility]  Instruction Review Code  1- Verbalizes Understanding      Infection Prevention: - Provides verbal and written material to individual with discussion  of infection control including proper hand washing and proper equipment cleaning during exercise session.   Pulmonary Rehab from 09/11/2017 in [facility] Cardiac and Pulmonary Rehab  Date  09/11/17  Educator  [facility]    Instruction Review Code  1- Verbalizes Understanding      Falls Prevention: - Provides verbal and written material to individual with discussion of falls prevention and safety.   Pulmonary Rehab from 09/11/2017 in [facility] Cardiac and Pulmonary Rehab  Date  09/11/17  Educator  [facility]  Instruction Review Code  1- Verbalizes Understanding      Diabetes: - Individual verbal and written instruction to review signs/symptoms of diabetes, desired ranges of glucose level fasting, after meals and with exercise. Advice that pre and post exercise glucose checks will be done for 3 sessions at entry of program.   Chronic Lung Diseases: - Group verbal and written instruction to review new updates, new respiratory medications, new advancements in procedures and treatments. Provide informative websites and "800" numbers of self-education.   Lung Procedures: - Group verbal and written instruction to describe testing methods done to diagnose lung disease. Review the outcome of test results. Describe the treatment choices: Pulmonary Function Tests, ABGs and oximetry.   Energy Conservation: - Provide group verbal and written instruction for methods to conserve energy, plan and organize activities. Instruct on pacing techniques, use of adaptive equipment and posture/positioning to relieve shortness of breath.   Triggers: - Group verbal and written instruction to review types of environmental controls: home humidity, furnaces, filters, dust mite/pet prevention, HEPA vacuums. To discuss weather changes, air quality and the benefits of nasal washing.   Exacerbations: - Group verbal and written instruction to provide: warning signs, infection symptoms, calling MD promptly, preventive modes, and value of vaccinations. Review: effective airway clearance, coughing and/or vibration techniques. Create an Sport and exercise psychologist.   Oxygen: - Individual and group verbal and written instruction on oxygen therapy. Includes  supplement oxygen, available portable oxygen systems, continuous and intermittent flow rates, oxygen safety, concentrators, and Medicare reimbursement for oxygen.   Respiratory Medications: - Group verbal and written instruction to review medications for lung disease. Drug class, frequency, complications, importance of spacers, rinsing mouth after steroid MDI's, and proper cleaning methods for nebulizers.   AED/CPR: - Group verbal and written instruction with the use of models to demonstrate the basic use of the AED with the basic ABC's of resuscitation.   Breathing Retraining: - Provides individuals verbal and written instruction on purpose, frequency, and proper technique of diaphragmatic breathing and pursed-lipped breathing. Applies individual practice skills.   Pulmonary Rehab from 09/11/2017 in [facility] Cardiac and Pulmonary Rehab  Date  09/11/17  Educator  [facility]  Instruction Review Code  1- Verbalizes Understanding      Anatomy and Physiology of the Lungs: - Group verbal and written instruction with the use of models to provide basic lung anatomy and physiology related to function, structure and complications of lung disease.   Anatomy & Physiology of the Heart: - Group verbal and written instruction and models provide basic cardiac anatomy and physiology, with the coronary electrical and arterial systems. Review of: AMI, Angina, Valve disease, Heart Failure, Cardiac Arrhythmia, Pacemakers, and the ICD.   Heart Failure: - Group verbal and written instruction on the basics of heart failure: signs/symptoms, treatments, explanation of ejection fraction, enlarged heart and cardiomyopathy.   Sleep Apnea: - Individual verbal and written instruction to review Obstructive Sleep Apnea. Review of risk factors, methods for diagnosing and types of masks and  machines for OSA.   Anxiety: - Provides group, verbal and written instruction on the correlation between heart/lung disease and anxiety,  treatment options, and management of anxiety.   Relaxation: - Provides group, verbal and written instruction about the benefits of relaxation for patients with heart/lung disease. Also provides patients with examples of relaxation techniques.   Cardiac Medications: - Group verbal and written instruction to review commonly prescribed medications for heart disease. Reviews the medication, class of the drug, and side effects.   Know Your Numbers: -Group verbal and written instruction about important numbers in your health.  Review of Cholesterol, Blood Pressure, Diabetes, and BMI and the role they play in your overall health.   Other: -Provides group and verbal instruction on various topics (see comments)    Knowledge Questionnaire Score: Knowledge Questionnaire Score - 09/11/17 1412      Knowledge Questionnaire Score   Pre Score  13/18 reviewed with patient        Core Components/Risk Factors/Patient Goals at Admission: Personal Goals and Risk Factors at Admission - 09/11/17 1422      Core Components/Risk Factors/Patient Goals on Admission    Weight Management  Yes;Weight Loss    Intervention  Weight Management: Develop a combined nutrition and exercise program designed to reach desired caloric intake, while maintaining appropriate intake of nutrient and fiber, sodium and fats, and appropriate energy expenditure required for the weight goal.;Weight Management: Provide education and appropriate resources to help participant work on and attain dietary goals.;Weight Management/Obesity: Establish reasonable short term and long term weight goals.    Admit Weight  196 lb 11.2 oz (89.2 kg)    Goal Weight: Short Term  191 lb (86.6 kg)    Goal Weight: Long Term  180 lb (81.6 kg)    Expected Outcomes  Short Term: Continue to assess and modify interventions until short term weight is achieved;Long Term: Adherence to nutrition and physical activity/exercise program aimed toward attainment of  established weight goal;Weight Loss: Understanding of general recommendations for a balanced deficit meal plan, which promotes 1-2 lb weight loss per week and includes a negative energy balance of [phone] kcal/d;Understanding recommendations for meals to include 15-35% energy as protein, 25-35% energy from fat, 35-60% energy from carbohydrates, less than 200mg  of dietary cholesterol, 20-35 gm of total fiber daily;Understanding of distribution of calorie intake throughout the day with the consumption of 4-5 meals/snacks    Tobacco Cessation  Yes    Number of packs per day  1 pack a day    Intervention  Assist the participant in steps to quit. Provide individualized education and counseling about committing to Tobacco Cessation, relapse prevention, and pharmacological support that can be provided by physician.;Education officer, environmentalffer self-teaching materials, assist with locating and accessing local/national Quit Smoking programs, and support quit date choice.    Expected Outcomes  Short Term: Will demonstrate readiness to quit, by selecting a quit date.;Short Term: Will quit all tobacco product use, adhering to prevention of relapse plan.;Long Term: Complete abstinence from all tobacco products for at least 12 months from quit date.    Improve shortness of breath with ADL's  Yes    Intervention  Provide education, individualized exercise plan and daily activity instruction to help decrease symptoms of SOB with activities of daily living.    Expected Outcomes  Short Term: Achieves a reduction of symptoms when performing activities of daily living.    Diabetes  Yes    Intervention  Provide education about signs/symptoms and action to take for  hypo/hyperglycemia.;Provide education about proper nutrition, including hydration, and aerobic/resistive exercise prescription along with prescribed medications to achieve blood glucose in normal ranges: Fasting glucose 65-99 mg/dL    Expected Outcomes  Short Term: Participant verbalizes  understanding of the signs/symptoms and immediate care of hyper/hypoglycemia, proper foot care and importance of medication, aerobic/resistive exercise and nutrition plan for blood glucose control.;Long Term: Attainment of HbA1C < 7%.    Heart Failure  Yes    Intervention  Provide a combined exercise and nutrition program that is supplemented with education, support and counseling about heart failure. Directed toward relieving symptoms such as shortness of breath, decreased exercise tolerance, and extremity edema.    Expected Outcomes  Improve functional capacity of life;Short term: Attendance in program 2-3 days a week with increased exercise capacity. Reported lower sodium intake. Reported increased fruit and vegetable intake. Reports medication compliance.;Short term: Daily weights obtained and reported for increase. Utilizing diuretic protocols set by physician.;Long term: Adoption of self-care skills and reduction of barriers for early signs and symptoms recognition and intervention leading to self-care maintenance.    Hypertension  Yes    Intervention  Provide education on lifestyle modifcations including regular physical activity/exercise, weight management, moderate sodium restriction and increased consumption of fresh fruit, vegetables, and low fat dairy, alcohol moderation, and smoking cessation.;Monitor prescription use compliance.    Expected Outcomes  Short Term: Continued assessment and intervention until BP is < 140/38mm HG in hypertensive participants. < 130/29mm HG in hypertensive participants with diabetes, heart failure or chronic kidney disease.;Long Term: Maintenance of blood pressure at goal levels.       Core Components/Risk Factors/Patient Goals Review:    Core Components/Risk Factors/Patient Goals at Discharge (Final Review):    ITP Comments: ITP Comments    Row Name 09/11/17 1350 09/18/17 0838         ITP Comments  Medical Evaluation completed. Chart sent for review  and changes to Dr. Bethann Punches Director of [facility]. Diagnosis can be found in [facility] encounter 09/11/17  30 day review completed. ITP sent to Dr. Bethann Punches Director of [facility]. Continue with ITP unless changes are made by physician.         Comments: 30 day review

## 2017-09-18 NOTE — Progress Notes (Signed)
Daily Session Note  Patient Details  Name: Kyle Rich MRN: [id number] Date of Birth: 12/01/1955 Referring Provider:     Pulmonary Rehab from 09/11/2017 in [facility] Cardiac and Pulmonary Rehab  Referring Provider  Salome Holmes MD      Encounter Date: 09/18/2017  Check In: Session Check In - 09/18/17 1124      Check-In   Location  [facility]-Cardiac & Pulmonary Rehab    Staff Present  Nada Maclachlan, BA, ACSM CEP, Exercise Physiologist;Kelly Amedeo Plenty, BS, ACSM CEP, Exercise Physiologist;Zahriah Roes Flavia Shipper    Supervising physician immediately available to respond to emergencies  [facility] immediately available ER MD    Physician(s)  Dr. Clearnce Hasten and Reita Cliche    Medication changes reported      No    Fall or balance concerns reported     No    Warm-up and Cool-down  Performed as group-led instruction    Resistance Training Performed  Yes    VAD Patient?  No      Pain Assessment   Currently in Pain?  No/denies          Social History   Tobacco Use  Smoking Status Current Every Day Smoker  . Packs/day: 1.00  . Years: 40.00  . Pack years: 40.00  . Types: Cigarettes  Smokeless Tobacco Never Used  Tobacco Comment   he is interested but doesnt want to quit due to him mother passing away    Goals Met:  Exercise tolerated well Queuing for purse lip breathing No report of cardiac concerns or symptoms Strength training completed today  Goals Unmet:  Not Applicable  Comments: First full day of exercise!  Patient was oriented to gym and equipment including functions, settings, policies, and procedures.  Patient's individual exercise prescription and treatment plan were reviewed.  All starting workloads were established based on the results of the 6 minute walk test done at initial orientation visit.  The plan for exercise progression was also introduced and progression will be customized based on patient's performance and goals.   Dr. Emily Filbert is Medical Director for [place] and [facility].

## 2017-09-20 DIAGNOSIS — J449 Chronic obstructive pulmonary disease, unspecified: Secondary | ICD-10-CM | POA: Diagnosis not present

## 2017-09-20 LAB — GLUCOSE, CAPILLARY
GLUCOSE-CAPILLARY: 110 mg/dL — AB (ref 65–99)
GLUCOSE-CAPILLARY: 100 mg/dL — AB (ref 65–99)

## 2017-09-20 NOTE — Progress Notes (Signed)
Daily Session Note  Patient Details  Name: Kyle Rich MRN: [id number] Date of Birth: Dec 17, 1955 Referring Provider:     Pulmonary Rehab from 09/11/2017 in [facility] Cardiac and Pulmonary Rehab  Referring Provider  Salome Holmes MD      Encounter Date: 09/20/2017  Check In: Session Check In - 09/20/17 1102      Check-In   Location  [facility]-Cardiac & Pulmonary Rehab    Staff Present  Justin Mend Lorre Nick, [place], RCEP, CCRP, Exercise Physiologist;Meredith Sherryll Burger, RN BSN    Supervising physician immediately available to respond to emergencies  [facility] immediately available ER MD    Physician(s)  Dr. Alfred Levins and Burlene Arnt    Medication changes reported      No    Fall or balance concerns reported     No    Warm-up and Cool-down  Performed as group-led instruction    Resistance Training Performed  Yes    VAD Patient?  No      Pain Assessment   Currently in Pain?  No/denies        Exercise Prescription Changes - 09/19/17 1600      Response to Exercise   Blood Pressure (Admit)  104/64    Blood Pressure (Exercise)  132/76    Heart Rate (Admit)  61 bpm    Heart Rate (Exercise)  79 bpm    Oxygen Saturation (Admit)  95 %    Oxygen Saturation (Exercise)  94 %    Rating of Perceived Exertion (Exercise)  13    Perceived Dyspnea (Exercise)  4    Symptoms  SOB, fatigue    Comments  first full day of exercise    Duration  Progress to 45 minutes of aerobic exercise without signs/symptoms of physical distress    Intensity  THRR unchanged      Progression   Progression  Continue to progress workloads to maintain intensity without signs/symptoms of physical distress.    Average METs  1.65      Resistance Training   Training Prescription  Yes    Weight  3 lbs    Reps  10-15      Interval Training   Interval Training  No      Oxygen   Oxygen  Continuous    Liters  2      Treadmill   MPH  1    Grade  0    Minutes  15    METs  1.77      NuStep   Level  1    SPM   49    Minutes  15    METs  1.6       Social History   Tobacco Use  Smoking Status Current Every Day Smoker  . Packs/day: 1.00  . Years: 40.00  . Pack years: 40.00  . Types: Cigarettes  Smokeless Tobacco Never Used  Tobacco Comment   he is interested but doesnt want to quit due to him mother passing away    Goals Met:  Independence with exercise equipment Exercise tolerated well No report of cardiac concerns or symptoms Strength training completed today  Goals Unmet:  Not Applicable  Comments: Pt able to follow exercise prescription today without complaint.  Will continue to monitor for progression.   Dr. Emily Filbert is Medical Director for [place] and [facility]monary [facility].

## 2017-09-22 DIAGNOSIS — J449 Chronic obstructive pulmonary disease, unspecified: Secondary | ICD-10-CM

## 2017-09-22 LAB — GLUCOSE, CAPILLARY: GLUCOSE-CAPILLARY: 99 mg/dL (ref 65–99)

## 2017-09-22 NOTE — Progress Notes (Signed)
Daily Session Note  Patient Details  Name: Kyle Rich MRN: [id number] Date of Birth: July 09, 1956 Referring Provider:     Pulmonary Rehab from 09/11/2017 in [facility] Cardiac and Pulmonary Rehab  Referring Provider  Salome Holmes MD      Encounter Date: 09/22/2017  Check In: Session Check In - 09/22/17 1125      Check-In   Location  [facility]-Cardiac & Pulmonary Rehab    Staff Present  Renita Papa, RN BSN;Jessica Luan Pulling, MA, RCEP, CCRP, Exercise Physiologist;Joseph Flavia Shipper    Supervising physician immediately available to respond to emergencies  [facility] immediately available ER MD    Physician(s)  Dr. Dia Crawford and [facility]    Medication changes reported      No    Fall or balance concerns reported     No    Warm-up and Cool-down  Performed as group-led instruction    Resistance Training Performed  Yes    VAD Patient?  No      Pain Assessment   Currently in Pain?  No/denies          Social History   Tobacco Use  Smoking Status Current Every Day Smoker  . Packs/day: 1.00  . Years: 40.00  . Pack years: 40.00  . Types: Cigarettes  Smokeless Tobacco Never Used  Tobacco Comment   he is interested but doesnt want to quit due to him mother passing away    Goals Met:  Proper associated with RPD/PD & O2 Sat Independence with exercise equipment Using PLB without cueing & demonstrates good technique Exercise tolerated well Strength training completed today  Goals Unmet:  Not Applicable  Comments: Pt able to follow exercise prescription today without complaint.  Will continue to monitor for progression.    Dr. Emily Filbert is Medical Director for [place] and [facility]monary [facility].

## 2017-09-29 DIAGNOSIS — J449 Chronic obstructive pulmonary disease, unspecified: Secondary | ICD-10-CM

## 2017-09-29 LAB — GLUCOSE, CAPILLARY: GLUCOSE-CAPILLARY: 93 mg/dL (ref 65–99)

## 2017-09-29 NOTE — Progress Notes (Signed)
Daily Session Note  Patient Details  Name: GENIE MIRABAL MRN: [id number] Date of Birth: June 18, 1956 Referring Provider:     Pulmonary Rehab from 09/11/2017 in [facility] Cardiac and Pulmonary Rehab  Referring Provider  Salome Holmes MD      Encounter Date: 09/29/2017  Check In: Session Check In - 09/29/17 1114      Check-In   Location  [facility]-Cardiac & Pulmonary Rehab    Staff Present  Justin Mend RCP,RRT,BSRT;Heath Lark, RN, BSN, CCRP;Mandi Ballard, BS, [facility]    Supervising physician immediately available to respond to emergencies  [facility] immediately available ER MD    Physician(s)  Dr. Mariea Clonts and Jacqualine Code    Medication changes reported      No    Fall or balance concerns reported     No    Warm-up and Cool-down  Performed as group-led instruction    Resistance Training Performed  Yes    VAD Patient?  No      Pain Assessment   Currently in Pain?  No/denies          Social History   Tobacco Use  Smoking Status Current Every Day Smoker  . Packs/day: 1.00  . Years: 40.00  . Pack years: 40.00  . Types: Cigarettes  Smokeless Tobacco Never Used  Tobacco Comment   he is interested but doesnt want to quit due to him mother passing away    Goals Met:  Independence with exercise equipment Exercise tolerated well No report of cardiac concerns or symptoms Strength training completed today  Goals Unmet:  Not Applicable  Comments: Pt able to follow exercise prescription today without complaint.  Will continue to monitor for progression.   Dr. Emily Filbert is Medical Director for [place] and [facility].

## 2017-10-02 DIAGNOSIS — J449 Chronic obstructive pulmonary disease, unspecified: Secondary | ICD-10-CM

## 2017-10-02 NOTE — Progress Notes (Signed)
Daily Session Note  Patient Details  Name: Kyle Rich MRN: [id number] Date of Birth: 10/26/1955 Referring Provider:     Pulmonary Rehab from 09/11/2017 in [facility] Cardiac and Pulmonary Rehab  Referring Provider  Salome Holmes MD      Encounter Date: 10/02/2017  Check In: Session Check In - 10/02/17 1126      Check-In   Location  [facility]-Cardiac & Pulmonary Rehab    Staff Present  Nada Maclachlan, BA, ACSM CEP, Exercise Physiologist;Kelly Amedeo Plenty, BS, ACSM CEP, Exercise Physiologist;Jaleiah Asay Flavia Shipper    Supervising physician immediately available to respond to emergencies  [facility] immediately available ER MD    Physician(s)  Dr. Lamar Laundry and Mariea Clonts    Medication changes reported      No    Fall or balance concerns reported     No    Tobacco Cessation  No Change    Warm-up and Cool-down  Performed as group-led instruction    Resistance Training Performed  Yes    VAD Patient?  No      Pain Assessment   Currently in Pain?  No/denies          Social History   Tobacco Use  Smoking Status Current Every Day Smoker  . Packs/day: 1.00  . Years: 40.00  . Pack years: 40.00  . Types: Cigarettes  Smokeless Tobacco Never Used  Tobacco Comment   he is interested but doesnt want to quit due to him mother passing away    Goals Met:  Independence with exercise equipment Exercise tolerated well No report of cardiac concerns or symptoms Strength training completed today  Goals Unmet:  Not Applicable  Comments: Pt able to follow exercise prescription today without complaint.  Will continue to monitor for progression.   Dr. Emily Filbert is Medical Director for [place] and [facility].

## 2017-10-04 DIAGNOSIS — J449 Chronic obstructive pulmonary disease, unspecified: Secondary | ICD-10-CM

## 2017-10-04 NOTE — Progress Notes (Signed)
Daily Session Note  Patient Details  Name: Kyle Rich MRN: [id number] Date of Birth: October 29, 1955 Referring Provider:     Pulmonary Rehab from 09/11/2017 in [facility] Cardiac and Pulmonary Rehab  Referring Provider  Salome Holmes MD      Encounter Date: 10/04/2017  Check In: Session Check In - 10/04/17 1122      Check-In   Location  [facility]-Cardiac & Pulmonary Rehab    Staff Present  Alberteen Sam, MA, RCEP, CCRP, Exercise Physiologist;Meredith Sherryll Burger, RN BSN;Jett Fukuda Flavia Shipper    Supervising physician immediately available to respond to emergencies  [facility] immediately available ER MD    Physician(s)   Dr. Jimmye Norman and Alfred Levins    Medication changes reported      No    Fall or balance concerns reported     No    Warm-up and Cool-down  Performed as group-led instruction    Resistance Training Performed  Yes    VAD Patient?  No      Pain Assessment   Currently in Pain?  No/denies          Social History   Tobacco Use  Smoking Status Current Every Day Smoker  . Packs/day: 1.00  . Years: 40.00  . Pack years: 40.00  . Types: Cigarettes  Smokeless Tobacco Never Used  Tobacco Comment   he is interested but doesnt want to quit due to him mother passing away    Goals Met:  Independence with exercise equipment Exercise tolerated well No report of cardiac concerns or symptoms Strength training completed today  Goals Unmet:  Not Applicable  Comments: Pt able to follow exercise prescription today without complaint.  Will continue to monitor for progression. Reviewed home exercise with pt today.  Pt plans to walk at home with dog and stores when doing his grocery shopping for exercise.  Reviewed THR, pulse, RPE, sign and symptoms, and when to call 911 or MD.  Also discussed weather considerations and indoor options.  Pt voiced understanding.   Dr. Emily Filbert is Medical Director for [place] and [facility].

## 2017-10-06 ENCOUNTER — Encounter: Payer: Medicaid Other | Attending: Family Medicine

## 2017-10-06 DIAGNOSIS — J449 Chronic obstructive pulmonary disease, unspecified: Secondary | ICD-10-CM | POA: Diagnosis not present

## 2017-10-06 NOTE — Progress Notes (Signed)
Daily Session Note  Patient Details  Name: Kyle Rich MRN: [id number] Date of Birth: [birth year] Referring Provider:     Pulmonary Rehab from 09/11/2017 in [facility] Cardiac and Pulmonary Rehab  Referring Provider  Salome Holmes MD      Encounter Date: 10/06/2017  Check In: Session Check In - 10/06/17 1148      Check-In   Location  [facility]-Cardiac & Pulmonary Rehab    Staff Present  Renita Papa, RN BSN;Jessica Luan Pulling, [place], RCEP, CCRP, Exercise Physiologist;Cadell Gabrielson Flavia Shipper    Supervising physician immediately available to respond to emergencies  [facility] immediately available ER MD    Physician(s)   Dr. Burlene Arnt and Clearnce Hasten    Medication changes reported      No    Fall or balance concerns reported     No    Warm-up and Cool-down  Performed as group-led instruction    Resistance Training Performed  Yes    VAD Patient?  No      Pain Assessment   Currently in Pain?  No/denies          Social History   Tobacco Use  Smoking Status Current Every Day Smoker  . Packs/day: 1.00  . Years: 40.00  . Pack years: 40.00  . Types: Cigarettes  Smokeless Tobacco Never Used  Tobacco Comment   he is interested but doesnt want to quit due to him mother passing away    Goals Met:  Independence with exercise equipment Exercise tolerated well No report of cardiac concerns or symptoms Strength training completed today  Goals Unmet:  Not Applicable  Comments: Pt able to follow exercise prescription today without complaint.  Will continue to monitor for progression.   Dr. Emily Filbert is Medical Director for [place] and [facility].

## 2017-10-13 ENCOUNTER — Encounter: Payer: Medicaid Other | Admitting: *Deleted

## 2017-10-13 DIAGNOSIS — J449 Chronic obstructive pulmonary disease, unspecified: Secondary | ICD-10-CM

## 2017-10-13 LAB — GLUCOSE, CAPILLARY: Glucose-Capillary: 128 mg/dL — ABNORMAL HIGH (ref 65–99)

## 2017-10-13 NOTE — Progress Notes (Signed)
Daily Session Note  Patient Details  Name: Kyle Rich MRN: [id number] Date of Birth: [birth year] Referring Provider:     Pulmonary Rehab from 09/11/2017 in [facility] Cardiac and Pulmonary Rehab  Referring Provider  Salome Holmes MD      Encounter Date: 10/13/2017  Check In: Session Check In - 10/13/17 1118      Check-In   Location  [facility]-Cardiac & Pulmonary Rehab    Staff Present  Alberteen Sam, MA, RCEP, CCRP, Exercise Physiologist;Meredith Sherryll Burger, RN BSN;Joseph Flavia Shipper    Supervising physician immediately available to respond to emergencies  [facility] immediately available ER MD    Physician(s)  Drs. Lord and Portal    Medication changes reported      No    Fall or balance concerns reported     No    Warm-up and Cool-down  Performed as group-led Higher education careers adviser Performed  Yes    VAD Patient?  No      Pain Assessment   Currently in Pain?  No/denies          Social History   Tobacco Use  Smoking Status Current Every Day Smoker  . Packs/day: 1.00  . Years: 40.00  . Pack years: 40.00  . Types: Cigarettes  Smokeless Tobacco Never Used  Tobacco Comment   he is interested but doesnt want to quit due to him mother passing away    Goals Met:  Proper associated with RPD/PD & O2 Sat Independence with exercise equipment Using PLB without cueing & demonstrates good technique Exercise tolerated well No report of cardiac concerns or symptoms Strength training completed today  Goals Unmet:  Not Applicable  Comments: Pt able to follow exercise prescription today without complaint.  Will continue to monitor for progression.    Dr. Emily Filbert is Medical Director for [place] and [facility].

## 2017-10-16 DIAGNOSIS — J449 Chronic obstructive pulmonary disease, unspecified: Secondary | ICD-10-CM | POA: Diagnosis not present

## 2017-10-16 NOTE — Progress Notes (Signed)
Pulmonary Individual Treatment Plan  Patient Details  Name: Kyle Rich MRN: [id number] Date of Birth: 02/15/1956 Referring Provider:     Pulmonary Rehab from 09/11/2017 in [facility] Cardiac and Pulmonary Rehab  Referring Provider  Salome Holmes MD      Initial Encounter Date:    Pulmonary Rehab from 09/11/2017 in [facility] Cardiac and Pulmonary Rehab  Date  09/11/17  Referring Provider  Salome Holmes MD      Visit Diagnosis: Chronic obstructive pulmonary disease, unspecified COPD type ([place])  Patient's Home Medications on Admission:  Current Outpatient Medications:  .  albuterol (PROAIR HFA) 108 (90 BASE) MCG/ACT inhaler, Inhale 2 puffs into the lungs every 6 (six) hours as needed for wheezing. , Disp: , Rfl:  .  ALPRAZolam (XANAX) 0.25 MG tablet, Take 1 tablet (0.25 mg total) 2 (two) times daily as needed by mouth for anxiety., Disp: 20 tablet, Rfl: 0 .  aspirin EC 325 MG EC tablet, Take 1 tablet (325 mg total) by mouth daily., Disp: 30 tablet, Rfl: 0 .  atorvastatin (LIPITOR) 40 MG tablet, Take 1 tablet (40 mg total) by mouth daily at 6 PM., Disp: , Rfl:  .  chlorpheniramine-HYDROcodone (TUSSIONEX) 10-8 MG/5ML SUER, Take 5 mLs every 12 (twelve) hours by mouth., Disp: 115 mL, Rfl: 0 .  escitalopram (LEXAPRO) 20 MG tablet, Take 1 tablet (20 mg total) at bedtime by mouth., Disp: 30 tablet, Rfl: 2 .  Fluticasone-Salmeterol (ADVAIR DISKUS) 500-50 MCG/DOSE AEPB, Inhale 1 puff every 12 (twelve) hours into the lungs., Disp: 60 each, Rfl: 2 .  ipratropium-albuterol (DUONEB) 0.5-2.5 (3) MG/3ML SOLN, Take 3 mLs every 6 (six) hours as needed by nebulization (wheezing)., Disp: 360 mL, Rfl: 1 .  metFORMIN (GLUCOPHAGE) 500 MG tablet, Take by mouth 2 (two) times daily with a meal., Disp: , Rfl:  .  metoprolol tartrate (LOPRESSOR) 25 MG tablet, Take 1 tablet (25 mg total) by mouth 2 (two) times daily., Disp: , Rfl:  .  nicotine (NICODERM CQ - DOSED IN MG/24 HOURS) 14 mg/24hr patch, Place 1 patch (14 mg total)  daily onto the skin., Disp: 28 patch, Rfl: 0 .  pantoprazole (PROTONIX) 40 MG tablet, Take 1 tablet (40 mg total) daily by mouth., Disp: 30 tablet, Rfl: 2 .  predniSONE (STERAPRED UNI-PAK 21 TAB) 10 MG (21) TBPK tablet, 6 tabs PO x 1 day 5 tabs PO x 1 day 4 tabs PO x 1 day 3 tabs PO x 1 day 2 tabs PO x 1 day 1 tab PO x 1 day and stop, Disp: 21 tablet, Rfl: 0 .  SUBOXONE 8-2 MG FILM, Place 1 Film (8 mg total) under the tongue 2 (two) times daily NADEAN [id number], Disp: , Rfl: 0 .  tiotropium (SPIRIVA HANDIHALER) 18 MCG inhalation capsule, Place 1 capsule (18 mcg total) daily into inhaler and inhale., Disp: 30 capsule, Rfl: 2  Past Medical History: Past Medical History:  Diagnosis Date  . Arthritis   . Asthma   . CHF (congestive heart failure) ([place])   . COPD (chronic obstructive pulmonary disease) ([place])   . Diabetes mellitus without complication ([place])   . Hepatitis C   . Hypertension     Tobacco Use: Social History   Tobacco Use  Smoking Status Current Every Day Smoker  . Packs/day: 1.00  . Years: 40.00  . Pack years: 40.00  . Types: Cigarettes  Smokeless Tobacco Never Used  Tobacco Comment   he is interested but doesnt want to quit due to him  mother passing away    Labs: Recent Review Flowsheet Data    Labs for ITP Cardiac and Pulmonary Rehab Latest Ref Rng & Units 06/09/2015 06/15/2016 07/02/2017 07/03/2017 07/19/2017   Cholestrol 0 - 200 mg/dL - - - - -   LDLCALC 0 - 100 mg/dL - - - - -   HDL 40 - 60 mg/dL - - - - -   Trlycerides 0 - 200 mg/dL - - - - -   Hemoglobin A1c 4.8 - 5.6 % - - - 6.6(H) -   PHART 7.350 - 7.450 - - 7.41 7.38 -   PCO2ART 32.0 - 48.0 mmHg - - 50(H) 48 -   HCO3 20.0 - 28.0 mmol/L 33.1(H) 28.9(H) 31.7(H) 28.4(H) 40.3(H)   O2SAT % - 92.7 98.3 93.4 96.1       Pulmonary Assessment Scores: Pulmonary Assessment Scores    Row Name 09/11/17 1410         ADL UCSD   ADL Phase  Entry     SOB Score total  84     Rest  3     Walk  3     Stairs  5       Bath  4     Dress  3     Shop  4       CAT Score   CAT Score  27       mMRC Score   mMRC Score  3        Pulmonary Function Assessment: Pulmonary Function Assessment - 09/11/17 1428      Initial Spirometry Results   FVC%  33 %    FEV1%  27 %    FEV1/FVC Ratio  60.27    Comments  Best of 2, good patient effort      Post Bronchodilator Spirometry Results   FVC%  38.94 %    FEV1%  32.94 %    FEV1/FVC Ratio  62.12    Comments  Best of 2, good patient effort      Breath   Bilateral Breath Sounds  Wheezes    Shortness of Breath  Limiting activity;Fear of Shortness of Breath;Panic with Shortness of Breath;Yes       Exercise Target Goals:    Exercise Program Goal: Individual exercise prescription set using results from initial 6 min walk test and THRR while considering  patient's activity barriers and safety.    Exercise Prescription Goal: Initial exercise prescription builds to 30-45 minutes a day of aerobic activity, 2-3 days per week.  Home exercise guidelines will be given to patient during program as part of exercise prescription that the participant will acknowledge.  Activity Barriers & Risk Stratification: Activity Barriers & Cardiac Risk Stratification - 09/11/17 1539      Activity Barriers & Cardiac Risk Stratification   Activity Barriers  Deconditioning;Muscular Weakness;Shortness of Breath;Joint Problems;Balance Concerns bilateral hip pain       6 Minute Walk: 6 Minute Walk    Row Name 09/11/17 1530         6 Minute Walk   Phase  Initial     Distance  570 feet     Walk Time  4.65 minutes     # of Rest Breaks  2 1:20, stopped at 5:01     MPH  1.39     METS  1.59     RPE  13     Perceived Dyspnea   2     VO2 Peak  5.55     Symptoms  Yes (comment)     Comments  leg fatigue/heaviness/pain 10/10     Resting HR  67 bpm     Resting BP  146/76     Resting Oxygen Saturation   94 %     Exercise Oxygen Saturation  during 6 min walk  93 %     Max  Ex. HR  85 bpm     Max Ex. BP  154/74     2 Minute Post BP  144/70       Interval HR   1 Minute HR  70     2 Minute HR  74     3 Minute HR  74     4 Minute HR  78     5 Minute HR  85     6 Minute HR  75     2 Minute Post HR  66     Interval Heart Rate?  Yes       Interval Oxygen   Interval Oxygen?  Yes     Baseline Oxygen Saturation %  94 %     1 Minute Oxygen Saturation %  93 %     1 Minute Liters of Oxygen  2 L     2 Minute Oxygen Saturation %  94 %     2 Minute Liters of Oxygen  2 L     3 Minute Oxygen Saturation %  94 %     3 Minute Liters of Oxygen  2 L     4 Minute Oxygen Saturation %  95 %     4 Minute Liters of Oxygen  2 L     5 Minute Oxygen Saturation %  95 %     5 Minute Liters of Oxygen  2 L     6 Minute Oxygen Saturation %  95 %     6 Minute Liters of Oxygen  2 L     2 Minute Post Oxygen Saturation %  96 %     2 Minute Post Liters of Oxygen  2 L       Oxygen Initial Assessment: Oxygen Initial Assessment - 09/11/17 1419      Home Oxygen   Home Oxygen Device  Home Concentrator;E-Tanks    Sleep Oxygen Prescription  Continuous    Liters per minute  2    Home Exercise Oxygen Prescription  Continuous    Liters per minute  2    Home at Rest Exercise Oxygen Prescription  Continuous    Liters per minute  2    Compliance with Home Oxygen Use  Yes      Initial 6 min Walk   Oxygen Used  Continuous    Liters per minute  2      Program Oxygen Prescription   Program Oxygen Prescription  Continuous    Liters per minute  2      Intervention   Short Term Goals  To learn and demonstrate proper use of respiratory medications;To learn and demonstrate proper pursed lip breathing techniques or other breathing techniques.;To learn and understand importance of monitoring SPO2 with pulse oximeter and demonstrate accurate use of the pulse oximeter.;To learn and understand importance of maintaining oxygen saturations>88%;To learn and exhibit compliance with exercise, home and  travel O2 prescription takes albuterol nebulizer, pro air inhaler. Advair and spiriva    Long  Term Goals  Exhibits compliance with exercise, home and travel O2 prescription;Verbalizes  importance of monitoring SPO2 with pulse oximeter and return demonstration;Maintenance of O2 saturations>88%;Exhibits proper breathing techniques, such as pursed lip breathing or other method taught during program session;Compliance with respiratory medication;Demonstrates proper use of MDI's       Oxygen Re-Evaluation: Oxygen Re-Evaluation    Row Name 09/18/17 1125 10/13/17 1212           Program Oxygen Prescription   Program Oxygen Prescription  Continuous  Continuous      Liters per minute  2  2        Home Oxygen   Home Oxygen Device  Home Concentrator;E-Tanks  Home Concentrator;E-Tanks      Sleep Oxygen Prescription  Continuous  Continuous      Liters per minute  2  2      Home Exercise Oxygen Prescription  Continuous  Continuous      Liters per minute  2  2      Home at Rest Exercise Oxygen Prescription  Continuous  Continuous      Liters per minute  2  -      Compliance with Home Oxygen Use  Yes  Yes        Goals/Expected Outcomes   Short Term Goals  To learn and demonstrate proper use of respiratory medications;To learn and demonstrate proper pursed lip breathing techniques or other breathing techniques.;To learn and understand importance of monitoring SPO2 with pulse oximeter and demonstrate accurate use of the pulse oximeter.;To learn and understand importance of maintaining oxygen saturations>88%;To learn and exhibit compliance with exercise, home and travel O2 prescription  To learn and demonstrate proper use of respiratory medications;To learn and demonstrate proper pursed lip breathing techniques or other breathing techniques.;To learn and understand importance of monitoring SPO2 with pulse oximeter and demonstrate accurate use of the pulse oximeter.;To learn and understand importance of  maintaining oxygen saturations>88%;To learn and exhibit compliance with exercise, home and travel O2 prescription      Long  Term Goals  Exhibits compliance with exercise, home and travel O2 prescription;Verbalizes importance of monitoring SPO2 with pulse oximeter and return demonstration;Maintenance of O2 saturations>88%;Exhibits proper breathing techniques, such as pursed lip breathing or other method taught during program session;Compliance with respiratory medication;Demonstrates proper use of MDI's  Exhibits compliance with exercise, home and travel O2 prescription;Verbalizes importance of monitoring SPO2 with pulse oximeter and return demonstration;Maintenance of O2 saturations>88%;Exhibits proper breathing techniques, such as pursed lip breathing or other method taught during program session;Compliance with respiratory medication;Demonstrates proper use of MDI's      Comments  Reviewed PLB technique with pt.  Talked about how it work and it's important to maintaining his exercise saturations.    Marquist states that he checks his oxygen twice a day at home. He has been working on PLB. He uses his nebulizer 3-4 times a day. He is wanting to quit smoking but states not that much. Gave patient information about quitting smoking.      Goals/Expected Outcomes  Short: Become more profiecient at using PLB.   Long: Become independent at using PLB.  Short: work on decreasing how many cigarettes he smokes. Long: Quit smoking.         Oxygen Discharge (Final Oxygen Re-Evaluation): Oxygen Re-Evaluation - 10/13/17 1212      Program Oxygen Prescription   Program Oxygen Prescription  Continuous    Liters per minute  2      Home Oxygen   Home Oxygen Device  Home Concentrator;E-Tanks    Sleep Oxygen Prescription  Continuous    Liters per minute  2    Home Exercise Oxygen Prescription  Continuous    Liters per minute  2    Home at Rest Exercise Oxygen Prescription  Continuous    Compliance with Home Oxygen  Use  Yes      Goals/Expected Outcomes   Short Term Goals  To learn and demonstrate proper use of respiratory medications;To learn and demonstrate proper pursed lip breathing techniques or other breathing techniques.;To learn and understand importance of monitoring SPO2 with pulse oximeter and demonstrate accurate use of the pulse oximeter.;To learn and understand importance of maintaining oxygen saturations>88%;To learn and exhibit compliance with exercise, home and travel O2 prescription    Long  Term Goals  Exhibits compliance with exercise, home and travel O2 prescription;Verbalizes importance of monitoring SPO2 with pulse oximeter and return demonstration;Maintenance of O2 saturations>88%;Exhibits proper breathing techniques, such as pursed lip breathing or other method taught during program session;Compliance with respiratory medication;Demonstrates proper use of MDI's    Comments  Rae states that he checks his oxygen twice a day at home. He has been working on PLB. He uses his nebulizer 3-4 times a day. He is wanting to quit smoking but states not that much. Gave patient information about quitting smoking.    Goals/Expected Outcomes  Short: work on decreasing how many cigarettes he smokes. Long: Quit smoking.       Initial Exercise Prescription: Initial Exercise Prescription - 09/11/17 1500      Date of Initial Exercise RX and Referring Provider   Date  09/11/17    Referring Provider  Salome Holmes MD      Oxygen   Oxygen  Continuous    Liters  2      Treadmill   MPH  1    Grade  0    Minutes  15    METs  1.77      NuStep   Level  1    SPM  80    Minutes  15    METs  1.7      Arm Ergometer   Level  1    Watts  22    RPM  25    Minutes  15    METs  1.7      Prescription Details   Frequency (times per week)  3    Duration  Progress to 45 minutes of aerobic exercise without signs/symptoms of physical distress      Intensity   THRR 40-80% of Max Heartrate  103-139     Ratings of Perceived Exertion  11-15    Perceived Dyspnea  0-4      Progression   Progression  Continue to progress workloads to maintain intensity without signs/symptoms of physical distress.      Resistance Training   Training Prescription  Yes    Weight  3 lbs    Reps  10-15       Perform Capillary Blood Glucose checks as needed.  Exercise Prescription Changes: Exercise Prescription Changes    Row Name 09/11/17 1500 09/19/17 1600 10/04/17 1200         Response to Exercise   Blood Pressure (Admit)  146/76  104/64  126/62     Blood Pressure (Exercise)  154/74  132/76  -     Blood Pressure (Exit)  144/70  -  112/70     Heart Rate (Admit)  67 bpm  61 bpm  70 bpm  Heart Rate (Exercise)  85 bpm  79 bpm  86 bpm     Heart Rate (Exit)  66 bpm  -  66 bpm     Oxygen Saturation (Admit)  94 %  95 %  93 %     Oxygen Saturation (Exercise)  93 %  94 %  94 %     Oxygen Saturation (Exit)  96 %  -  96 %     Rating of Perceived Exertion (Exercise)  _0 Perceived Dyspnea (Exercise)  _1 Symptoms  leg fatigue/heaviness/pain 10/10  SOB, fatigue  none     Comments  walk test results  first full day of exercise  -     Duration  -  Progress to 45 minutes of aerobic exercise without signs/symptoms of physical distress  Continue with 45 min of aerobic exercise without signs/symptoms of physical distress.     Intensity  -  THRR unchanged  THRR unchanged       Progression   Progression  -  Continue to progress workloads to maintain intensity without signs/symptoms of physical distress.  Continue to progress workloads to maintain intensity without signs/symptoms of physical distress.     Average METs  -  1.65  2       Resistance Training   Training Prescription  -  Yes  Yes     Weight  -  3 lbs  3 lbs     Reps  -  10-15  10-15       Interval Training   Interval Training  -  No  No       Oxygen   Oxygen  -  Continuous  Continuous     Liters  -  2  2       Treadmill     MPH  -  1  0.8     Grade  -  0  0     Minutes  -  15  15     METs  -  1.77  1.7       NuStep   Level  -  1  2     SPM  -  49  84     Minutes  -  15  15     METs  -  1.6  2.1       Arm Ergometer   Level  -  -  1     RPM  -  -  36     Minutes  -  -  15     METs  -  -  2.2       Home Exercise Plan   Plans to continue exercise at  -  -  Home (comment) walking with dog and at stores     Frequency  -  -  Add 1 additional day to program exercise sessions.     Initial Home Exercises Provided  -  -  10/04/17        Exercise Comments: Exercise Comments    Row Name 09/18/17 1124           Exercise Comments  First full day of exercise!  Patient was oriented to gym and equipment including functions, settings, policies, and procedures.  Patient's individual exercise prescription and treatment plan were reviewed.  All starting workloads were established based on the results of the 6  minute walk test done at initial orientation visit.  The plan for exercise progression was also introduced and progression will be customized based on patient's performance and goals.          Exercise Goals and Review: Exercise Goals    Row Name 09/11/17 1544             Exercise Goals   Increase Physical Activity  Yes       Intervention  Provide advice, education, support and counseling about physical activity/exercise needs.;Develop an individualized exercise prescription for aerobic and resistive training based on initial evaluation findings, risk stratification, comorbidities and participant's personal goals.       Expected Outcomes  Achievement of increased cardiorespiratory fitness and enhanced flexibility, muscular endurance and strength shown through measurements of functional capacity and personal statement of participant.       Increase Strength and Stamina  Yes       Intervention  Provide advice, education, support and counseling about physical activity/exercise needs.;Develop an  individualized exercise prescription for aerobic and resistive training based on initial evaluation findings, risk stratification, comorbidities and participant's personal goals.       Expected Outcomes  Achievement of increased cardiorespiratory fitness and enhanced flexibility, muscular endurance and strength shown through measurements of functional capacity and personal statement of participant.       Able to understand and use rate of perceived exertion (RPE) scale  Yes       Intervention  Provide education and explanation on how to use RPE scale       Expected Outcomes  Short Term: Able to use RPE daily in rehab to express subjective intensity level;Long Term:  Able to use RPE to guide intensity level when exercising independently       Able to understand and use Dyspnea scale  Yes       Intervention  Provide education and explanation on how to use Dyspnea scale       Expected Outcomes  Short Term: Able to use Dyspnea scale daily in rehab to express subjective sense of shortness of breath during exertion;Long Term: Able to use Dyspnea scale to guide intensity level when exercising independently       Knowledge and understanding of Target Heart Rate Range (THRR)  Yes       Intervention  Provide education and explanation of THRR including how the numbers were predicted and where they are located for reference       Expected Outcomes  Short Term: Able to state/look up THRR;Long Term: Able to use THRR to govern intensity when exercising independently;Short Term: Able to use daily as guideline for intensity in rehab       Able to check pulse independently  Yes       Intervention  Provide education and demonstration on how to check pulse in carotid and radial arteries.;Review the importance of being able to check your own pulse for safety during independent exercise       Expected Outcomes  Short Term: Able to explain why pulse checking is important during independent exercise;Long Term: Able to check  pulse independently and accurately       Understanding of Exercise Prescription  Yes       Intervention  Provide education, explanation, and written materials on patient's individual exercise prescription       Expected Outcomes  Short Term: Able to explain program exercise prescription;Long Term: Able to explain home exercise prescription to exercise independently  Exercise Goals Re-Evaluation : Exercise Goals Re-Evaluation    Row Name 09/18/17 1124 09/19/17 1612 10/04/17 1223         Exercise Goal Re-Evaluation   Exercise Goals Review  Understanding of Exercise Prescription;Able to understand and use Dyspnea scale;Knowledge and understanding of Target Heart Rate Range (THRR);Able to understand and use rate of perceived exertion (RPE) scale  Increase Physical Activity;Increase Strength and Stamina;Understanding of Exercise Prescription  Increase Physical Activity;Increase Strength and Stamina;Understanding of Exercise Prescription;Able to understand and use rate of perceived exertion (RPE) scale;Able to check pulse independently;Knowledge and understanding of Target Heart Rate Range (THRR);Able to understand and use Dyspnea scale     Comments  Reviewed RPE scale, THR and program prescription with pt today.  Pt voiced understanding and was given a copy of goals to take home.   Simona Huh has completed his first full day of exercise.  We will continue to monitor his progression.   Simona Huh is off to a good start in rehab.  He is already feeling stronger and has more stamina.  He says that he is feeling better overall.  Currently, he is not doing any home exercise so we talked about finding ways to carve out some time to get his home exercise.  Reviewed home exercise with pt today.  Pt plans to walk at home with dog and stores when doing his grocery shopping for exercise.  Reviewed THR, pulse, RPE, sign and symptoms, and when to call 911 or MD.  Also discussed weather considerations and indoor  options.  Pt voiced understanding.     Expected Outcomes  Short: Use RPE daily to regulate intensity.  Long: Follow program prescription in THR.  Short: Attend rehab regularly.  Long: Follow program prescription.   Short: Add in at least one extra day at week at home.  Long: Continue to increase physical activity.         Discharge Exercise Prescription (Final Exercise Prescription Changes): Exercise Prescription Changes - 10/04/17 1200      Response to Exercise   Blood Pressure (Admit)  126/62    Blood Pressure (Exit)  112/70    Heart Rate (Admit)  70 bpm    Heart Rate (Exercise)  86 bpm    Heart Rate (Exit)  66 bpm    Oxygen Saturation (Admit)  93 %    Oxygen Saturation (Exercise)  94 %    Oxygen Saturation (Exit)  96 %    Rating of Perceived Exertion (Exercise)  12    Perceived Dyspnea (Exercise)  1    Symptoms  none    Duration  Continue with 45 min of aerobic exercise without signs/symptoms of physical distress.    Intensity  THRR unchanged      Progression   Progression  Continue to progress workloads to maintain intensity without signs/symptoms of physical distress.    Average METs  2      Resistance Training   Training Prescription  Yes    Weight  3 lbs    Reps  10-15      Interval Training   Interval Training  No      Oxygen   Oxygen  Continuous    Liters  2      Treadmill   MPH  0.8    Grade  0    Minutes  15    METs  1.7      NuStep   Level  2    SPM  84  Minutes  15    METs  2.1      Arm Ergometer   Level  1    RPM  36    Minutes  15    METs  2.2      Home Exercise Plan   Plans to continue exercise at  Home (comment) walking with dog and at stores    Frequency  Add 1 additional day to program exercise sessions.    Initial Home Exercises Provided  10/04/17       Nutrition:  Target Goals: Understanding of nutrition guidelines, daily intake of sodium <1579m, cholesterol <2036m calories 30% from fat and 7% or less from saturated fats, daily  to have 5 or more servings of fruits and vegetables.  Biometrics: Pre Biometrics - 09/11/17 1545      Pre Biometrics   Height  5' 3.5" (1.613 m)    Weight  196 lb 11.2 oz (89.2 kg)    Waist Circumference  43 inches    Hip Circumference  42 inches    Waist to Hip Ratio  1.02 %    BMI (Calculated)  34.29        Nutrition Therapy Plan and Nutrition Goals: Nutrition Therapy & Goals - 09/11/17 1409      Personal Nutrition Goals   Comments  Patients wants to lose weight and eat healthier. He would like to meet with the dietician.      Intervention Plan   Intervention  Prescribe, educate and counsel regarding individualized specific dietary modifications aiming towards targeted core components such as weight, hypertension, lipid management, diabetes, heart failure and other comorbidities.;Nutrition handout(s) given to patient.    Expected Outcomes  Short Term Goal: Understand basic principles of dietary content, such as calories, fat, sodium, cholesterol and nutrients.;Long Term Goal: Adherence to prescribed nutrition plan.       Nutrition Assessments:   Nutrition Goals Re-Evaluation: Nutrition Goals Re-Evaluation    RoGarrisoname 10/13/17 1158             Goals   Current Weight  192 lb (87.1 kg)       Nutrition Goal  Lose wieght, eat healthier and Meet with the Dietician.       Comment  JaAsherants to eat healthier and is willing to make changes in his diet. Money is a factor in his eating decisions. Made an appointment to meet with the dietician on Feb 18th        Expected Outcome  Short: meet with the dietician. Long: adhere to a diet plan.           Nutrition Goals Discharge (Final Nutrition Goals Re-Evaluation): Nutrition Goals Re-Evaluation - 10/13/17 1158      Goals   Current Weight  192 lb (87.1 kg)    Nutrition Goal  Lose wieght, eat healthier and Meet with the Dietician.    Comment  JaDerickants to eat healthier and is willing to make changes in his diet. Money is a  factor in his eating decisions. Made an appointment to meet with the dietician on Feb 18th     Expected Outcome  Short: meet with the dietician. Long: adhere to a diet plan.        Psychosocial: Target Goals: Acknowledge presence or absence of significant depression and/or stress, maximize coping skills, provide positive support system. Participant is able to verbalize types and ability to use techniques and skills needed for reducing stress and depression.   Initial Review & Psychosocial  Screening: Initial Psych Review & Screening - 09/11/17 1404      Initial Review   Current issues with  Current Depression;History of Depression;Current Psychotropic Meds;Current Sleep Concerns;Current Stress Concerns    Source of Stress Concerns  Chronic Illness;Family;Unable to perform yard/household activities;Unable to participate in former interests or hobbies    Comments  His mother died about three weeks ago and it has  been really hard on him.      Family Dynamics   Good Support System?  No    Strains  Intra-family strains    Comments  He blames his sister for putting his mom in a home. He feels like she was not getting the care she needed when she was in the home and thats what killed her.      Barriers   Psychosocial barriers to participate in program  The patient should benefit from training in stress management and relaxation.      Screening Interventions   Interventions  Yes;Encouraged to exercise;Program counselor consult;To provide support and resources with identified psychosocial needs;Provide feedback about the scores to participant    Expected Outcomes  Short Term goal: Utilizing psychosocial counselor, staff and physician to assist with identification of specific Stressors or current issues interfering with healing process. Setting desired goal for each stressor or current issue identified.;Long Term Goal: Stressors or current issues are controlled or eliminated.;Short Term goal:  Identification and review with participant of any Quality of Life or Depression concerns found by scoring the questionnaire.;Long Term goal: The participant improves quality of Life and PHQ9 Scores as seen by post scores and/or verbalization of changes       Quality of Life Scores:  Scores of 19 and below usually indicate a poorer quality of life in these areas.  A difference of  2-3 points is a clinically meaningful difference.  A difference of 2-3 points in the total score of the Quality of Life Index has been associated with significant improvement in overall quality of life, self-image, physical symptoms, and general health in studies assessing change in quality of life.  PHQ-9: Recent Review Flowsheet Data    Depression screen [facility] 2/9 09/11/2017   Decreased Interest 2   Down, Depressed, Hopeless 2   PHQ - 2 Score 4   Altered sleeping 3   Tired, decreased energy 3   Change in appetite 2   Feeling bad or failure about yourself  3   Trouble concentrating 2   Moving slowly or fidgety/restless 1   Suicidal thoughts 2    PHQ-9 Score 20   Difficult doing work/chores Somewhat difficult     Interpretation of Total Score  Total Score Depression Severity:  1-4 = Minimal depression, 5-9 = Mild depression, 10-14 = Moderate depression, 15-19 = Moderately severe depression, 20-27 = Severe depression   Psychosocial Evaluation and Intervention: Psychosocial Evaluation - 10/02/17 1225      Psychosocial Evaluation & Interventions   Interventions  Encouraged to exercise with the program and follow exercise prescription;Stress management education;Relaxation education    Comments  Counselor met with Mr. Carducci Brannock) today for initial psychosocial evaluation.  He is a 62 year old who has COPD and a recent heart attack and stents inserted.  Drezden also was recently diagnosed with diabetes as well.  He has a limited support system with a daughter and her child who lives with him, and some involvement in  his local church.  His mother was living with him until she passed recently and  there is some conflict with his siblings surrounding that currently.  Zacharias reports not sleeping well but having a good appetite.  Deni admits to depression currently as he states his mother was his "best friend".  He has been seeing a psychiatrist in [facility] for these mood issues.  He is also on several meds for depression and anxiety - however his current PHQ-9 is "20" indicating Severe Depression at this time.  Jalene also has multiple stressors with his health, the conflict with his siblings and severe financial issues.  He states he will likely be "kicked out" of his rental home soon due to inability to pay rent - and he has no money for heat in his house at this time.  Counselor provided Carlyle with information on [employer] for assistance.  Counselor also recommended [facility] support groups to him as he struggles through the loss of his mother.  Mirko checked that he has "thoughts that he would be better off dead."  Counselor assessed with Ivis stating he has no plan and is just feeling sad mostly missing his mother. He is followed by his psychiatrist and he confirmed a plan if these thoughts became worse.  Counselor will follow with Jeneen Rinks on all of this.     Expected Outcomes  Yamin will benefit from consistent exercise to achieve his goals of breathing better and having a  better quality of life.  He will contact [address] for financial and housing assistance.  He will look into Grief Share or Hospice for grief support and will follow with his psychiatrist for mood stabilization.      Continue Psychosocial Services   Follow up required by counselor       Psychosocial Re-Evaluation: Psychosocial Re-Evaluation    Divide Name 10/13/17 1154             Psychosocial Re-Evaluation   Current issues with  History of Depression;Current Depression;Current Stress Concerns       Comments  Darrian is trying to pay bills and is  having a hard time. He is also having trouble getting a ride to [place]. He is having trouble telling [facility] that he needs his ride to [place] so he can improve his breathing.        Expected Outcomes  Short: Call [facility] to get rides to [place]. Long: Maintain getting rides to [facility] to reduce stress.       Interventions  Encouraged to attend Pulmonary Rehabilitation for the exercise       Continue Psychosocial Services   Follow up required by staff          Psychosocial Discharge (Final Psychosocial Re-Evaluation): Psychosocial Re-Evaluation - 10/13/17 1154      Psychosocial Re-Evaluation   Current issues with  History of Depression;Current Depression;Current Stress Concerns    Comments  Paula is trying to pay bills and is having a hard time. He is also having trouble getting a ride to [place]. He is having trouble telling [facility] that he needs his ride to [place] so he can improve his breathing.     Expected Outcomes  Short: Call [facility] to get rides to [place]. Long: Maintain getting rides to [facility] to reduce stress.    Interventions  Encouraged to attend Pulmonary Rehabilitation for the exercise    Continue Psychosocial Services   Follow up required by staff       Education: Education Goals: Education classes will be provided on a weekly basis, covering required topics. Participant will state understanding/return  demonstration of topics presented.  Learning Barriers/Preferences: Learning Barriers/Preferences - 09/11/17 1412      Learning Barriers/Preferences   Learning Barriers  None    Learning Preferences  None       Education Topics:  Initial Evaluation Education: - Verbal, written and demonstration of respiratory meds, oximetry and breathing techniques. Instruction on use of nebulizers and MDIs and importance of monitoring MDI activations.   Pulmonary Rehab from 10/04/2017 in [facility] Cardiac and Pulmonary Rehab  Date  09/11/17  Educator  [facility]  Instruction Review Code   1- Verbalizes Understanding      General Nutrition Guidelines/Fats and Fiber: -Group instruction provided by verbal, written material, models and posters to present the general guidelines for heart healthy nutrition. Gives an explanation and review of dietary fats and fiber.   Pulmonary Rehab from 10/04/2017 in [facility] Cardiac and Pulmonary Rehab  Date  10/02/17  Educator  [facility]  Instruction Review Code  1- Verbalizes Understanding      Controlling Sodium/Reading Food Labels: -Group verbal and written material supporting the discussion of sodium use in heart healthy nutrition. Review and explanation with models, verbal and written materials for utilization of the food label.   Exercise Physiology & General Exercise Guidelines: - Group verbal and written instruction with models to review the exercise physiology of the cardiovascular system and associated critical values. Provides general exercise guidelines with specific guidelines to those with heart or lung disease.    Aerobic Exercise & Resistance Training: - Gives group verbal and written instruction on the various components of exercise. Focuses on aerobic and resistive training programs and the benefits of this training and how to safely progress through these programs.   Pulmonary Rehab from 10/04/2017 in [facility] Cardiac and Pulmonary Rehab  Date  09/22/17  Educator  [facility]  Instruction Review Code  1- Verbalizes Understanding      Flexibility, Balance, Mind/Body Relaxation: Provides group verbal/written instruction on the benefits of flexibility and balance training, including mind/body exercise modes such as yoga, pilates and tai chi.  Demonstration and skill practice provided.   Stress and Anxiety: - Provides group verbal and written instruction about the health risks of elevated stress and causes of high stress.  Discuss the correlation between heart/lung disease and anxiety and treatment options. Review healthy ways to manage with  stress and anxiety.   Depression: - Provides group verbal and written instruction on the correlation between heart/lung disease and depressed mood, treatment options, and the stigmas associated with seeking treatment.   Exercise & Equipment Safety: - Individual verbal instruction and demonstration of equipment use and safety with use of the equipment.   Pulmonary Rehab from 10/04/2017 in [facility] Cardiac and Pulmonary Rehab  Date  09/11/17  Educator  [facility]  Instruction Review Code  1- Verbalizes Understanding      Infection Prevention: - Provides verbal and written material to individual with discussion of infection control including proper hand washing and proper equipment cleaning during exercise session.   Pulmonary Rehab from 10/04/2017 in [facility] Cardiac and Pulmonary Rehab  Date  09/11/17  Educator  [facility]  Instruction Review Code  1- Verbalizes Understanding      Falls Prevention: - Provides verbal and written material to individual with discussion of falls prevention and safety.   Pulmonary Rehab from 10/04/2017 in [facility] Cardiac and Pulmonary Rehab  Date  09/11/17  Educator  [facility]  Instruction Review Code  1- Verbalizes Understanding      Diabetes: - Individual verbal and  written instruction to review signs/symptoms of diabetes, desired ranges of glucose level fasting, after meals and with exercise. Advice that pre and post exercise glucose checks will be done for 3 sessions at entry of program.   Chronic Lung Diseases: - Group verbal and written instruction to review updates, respiratory medications, advancements in procedures and treatments. Discuss use of supplemental oxygen including available portable oxygen systems, continuous and intermittent flow rates, concentrators, personal use and safety guidelines. Review proper use of inhaler and spacers. Provide informative websites for self-education.    Energy Conservation: - Provide group verbal and written instruction for methods to  conserve energy, plan and organize activities. Instruct on pacing techniques, use of adaptive equipment and posture/positioning to relieve shortness of breath.   Triggers and Exacerbations: - Group verbal and written instruction to review types of environmental triggers and ways to prevent exacerbations. Discuss weather changes, air quality and the benefits of nasal washing. Review warning signs and symptoms to help prevent infections. Discuss techniques for effective airway clearance, coughing, and vibrations.   Pulmonary Rehab from 10/04/2017 in [facility] Cardiac and Pulmonary Rehab  Date  09/20/17  Educator  [facility]  Instruction Review Code  1- Verbalizes Understanding      AED/CPR: - Group verbal and written instruction with the use of models to demonstrate the basic use of the AED with the basic ABC's of resuscitation.   Anatomy and Physiology of the Lungs: - Group verbal and written instruction with the use of models to provide basic lung anatomy and physiology related to function, structure and complications of lung disease.   Anatomy & Physiology of the Heart: - Group verbal and written instruction and models provide basic cardiac anatomy and physiology, with the coronary electrical and arterial systems. Review of Valvular disease and Heart Failure   Pulmonary Rehab from 10/04/2017 in [facility] Cardiac and Pulmonary Rehab  Date  10/04/17  Educator  [facility]  Instruction Review Code  1- Verbalizes Understanding      Cardiac Medications: - Group verbal and written instruction to review commonly prescribed medications for heart disease. Reviews the medication, class of the drug, and side effects.   Know Your Numbers and Risk Factors: -Group verbal and written instruction about important numbers in your health.  Discussion of what are risk factors and how they play a role in the disease process.  Review of Cholesterol, Blood Pressure, Diabetes, and BMI and the role they play in your overall  health.   Sleep Hygiene: -Provides group verbal and written instruction about how sleep can affect your health.  Define sleep hygiene, discuss sleep cycles and impact of sleep habits. Review good sleep hygiene tips.    Other: -Provides group and verbal instruction on various topics (see comments)    Knowledge Questionnaire Score: Knowledge Questionnaire Score - 09/11/17 1412      Knowledge Questionnaire Score   Pre Score  13/18 reviewed with patient        Core Components/Risk Factors/Patient Goals at Admission: Personal Goals and Risk Factors at Admission - 09/11/17 1422      Core Components/Risk Factors/Patient Goals on Admission    Weight Management  Yes;Weight Loss    Intervention  Weight Management: Develop a combined nutrition and exercise program designed to reach desired caloric intake, while maintaining appropriate intake of nutrient and fiber, sodium and fats, and appropriate energy expenditure required for the weight goal.;Weight Management: Provide education and appropriate resources to help participant work on and attain dietary goals.;Weight Management/Obesity: Establish reasonable short  term and long term weight goals.    Admit Weight  196 lb 11.2 oz (89.2 kg)    Goal Weight: Short Term  191 lb (86.6 kg)    Goal Weight: Long Term  180 lb (81.6 kg)    Expected Outcomes  Short Term: Continue to assess and modify interventions until short term weight is achieved;Long Term: Adherence to nutrition and physical activity/exercise program aimed toward attainment of established weight goal;Weight Loss: Understanding of general recommendations for a balanced deficit meal plan, which promotes 1-2 lb weight loss per week and includes a negative energy balance of [phone] kcal/d;Understanding recommendations for meals to include 15-35% energy as protein, 25-35% energy from fat, 35-60% energy from carbohydrates, less than 236m of dietary cholesterol, 20-35 gm of total fiber  daily;Understanding of distribution of calorie intake throughout the day with the consumption of 4-5 meals/snacks    Tobacco Cessation  Yes    Number of packs per day  1 pack a day    Intervention  Assist the participant in steps to quit. Provide individualized education and counseling about committing to Tobacco Cessation, relapse prevention, and pharmacological support that can be provided by physician.;OAdvice worker assist with locating and accessing local/national Quit Smoking programs, and support quit date choice.    Expected Outcomes  Short Term: Will demonstrate readiness to quit, by selecting a quit date.;Short Term: Will quit all tobacco product use, adhering to prevention of relapse plan.;Long Term: Complete abstinence from all tobacco products for at least 12 months from quit date.    Improve shortness of breath with ADL's  Yes    Intervention  Provide education, individualized exercise plan and daily activity instruction to help decrease symptoms of SOB with activities of daily living.    Expected Outcomes  Short Term: Achieves a reduction of symptoms when performing activities of daily living.    Diabetes  Yes    Intervention  Provide education about signs/symptoms and action to take for hypo/hyperglycemia.;Provide education about proper nutrition, including hydration, and aerobic/resistive exercise prescription along with prescribed medications to achieve blood glucose in normal ranges: Fasting glucose 65-99 mg/dL    Expected Outcomes  Short Term: Participant verbalizes understanding of the signs/symptoms and immediate care of hyper/hypoglycemia, proper foot care and importance of medication, aerobic/resistive exercise and nutrition plan for blood glucose control.;Long Term: Attainment of HbA1C < 7%.    Heart Failure  Yes    Intervention  Provide a combined exercise and nutrition program that is supplemented with education, support and counseling about heart failure.  Directed toward relieving symptoms such as shortness of breath, decreased exercise tolerance, and extremity edema.    Expected Outcomes  Improve functional capacity of life;Short term: Attendance in program 2-3 days a week with increased exercise capacity. Reported lower sodium intake. Reported increased fruit and vegetable intake. Reports medication compliance.;Short term: Daily weights obtained and reported for increase. Utilizing diuretic protocols set by physician.;Long term: Adoption of self-care skills and reduction of barriers for early signs and symptoms recognition and intervention leading to self-care maintenance.    Hypertension  Yes    Intervention  Provide education on lifestyle modifcations including regular physical activity/exercise, weight management, moderate sodium restriction and increased consumption of fresh fruit, vegetables, and low fat dairy, alcohol moderation, and smoking cessation.;Monitor prescription use compliance.    Expected Outcomes  Short Term: Continued assessment and intervention until BP is < 140/956mHG in hypertensive participants. < 130/8071mG in hypertensive participants with diabetes, heart failure or chronic  kidney disease.;Long Term: Maintenance of blood pressure at goal levels.       Core Components/Risk Factors/Patient Goals Review:  Goals and Risk Factor Review    Row Name 10/13/17 1223             Core Components/Risk Factors/Patient Goals Review   Personal Goals Review  Weight Management/Obesity;Hypertension;Tobacco Cessation;Improve shortness of breath with ADL's;Diabetes;Heart Failure       Review  Tarvis has been coming to [place] routinely except for when he cannot get a ride. He is a little bit interested in quitting smoking. Informed him of the benifits, told him about the free classes and gave him a packet about how to quit.       Expected Outcomes  Short: sign up for a class to quit smoking. Long: Quit smoking.          Core  Components/Risk Factors/Patient Goals at Discharge (Final Review):  Goals and Risk Factor Review - 10/13/17 1223      Core Components/Risk Factors/Patient Goals Review   Personal Goals Review  Weight Management/Obesity;Hypertension;Tobacco Cessation;Improve shortness of breath with ADL's;Diabetes;Heart Failure    Review  Hridhaan has been coming to [place] routinely except for when he cannot get a ride. He is a little bit interested in quitting smoking. Informed him of the benifits, told him about the free classes and gave him a packet about how to quit.    Expected Outcomes  Short: sign up for a class to quit smoking. Long: Quit smoking.       ITP Comments: ITP Comments    Row Name 09/11/17 1350 09/18/17 0838 09/22/17 1140 10/06/17 1159 10/16/17 0938   ITP Comments  Medical Evaluation completed. Chart sent for review and changes to Dr. Emily Filbert Director of [facility]. Diagnosis can be found in [facility] encounter 09/11/17  30 day review completed. ITP sent to Dr. Emily Filbert Director of [facility]. Continue with ITP unless changes are made by physician.  Javon will not be in class on Monday due to trasportation.  Ky will not be in class Wednesday 2/6 due to doctor's appointment  30 day review completed. ITP sent to Dr. Emily Filbert Director of [place]. Continue with ITP unless changes are made by physician.      Comments: 30 day review

## 2017-10-16 NOTE — Progress Notes (Signed)
Daily Session Note  Patient Details  Name: Kyle Rich MRN: [id number] Date of Birth: Apr 17, 1956 Referring Provider:     Pulmonary Rehab from 09/11/2017 in [facility] Cardiac and Pulmonary Rehab  Referring Provider  Salome Holmes MD      Encounter Date: 10/16/2017  Check In: Session Check In - 10/16/17 1141      Check-In   Location  [facility]-Cardiac & Pulmonary Rehab    Staff Present  Earlean Shawl, BS, ACSM CEP, Exercise Physiologist;Amanda Oletta Darter, BA, ACSM CEP, Exercise Physiologist;Joseph Flavia Shipper    Supervising physician immediately available to respond to emergencies  [facility] immediately available ER MD    Physician(s)  Dr. Mable Paris and Cinda Quest    Medication changes reported      No    Fall or balance concerns reported     No    Warm-up and Cool-down  Performed as group-led instruction    Resistance Training Performed  Yes    VAD Patient?  No      Pain Assessment   Currently in Pain?  No/denies          Social History   Tobacco Use  Smoking Status Current Every Day Smoker  . Packs/day: 1.00  . Years: 40.00  . Pack years: 40.00  . Types: Cigarettes  Smokeless Tobacco Never Used  Tobacco Comment   he is interested but doesnt want to quit due to him mother passing away    Goals Met:  Independence with exercise equipment Exercise tolerated well No report of cardiac concerns or symptoms Strength training completed today  Goals Unmet:  Not Applicable  Comments: Pt able to follow exercise prescription today without complaint.  Will continue to monitor for progression.   Dr. Emily Filbert is Medical Director for [facility] and [facility].

## 2017-10-18 DIAGNOSIS — J449 Chronic obstructive pulmonary disease, unspecified: Secondary | ICD-10-CM | POA: Diagnosis not present

## 2017-10-18 NOTE — Progress Notes (Signed)
Daily Session Note  Patient Details  Name: Kyle Rich MRN: [id number] Date of Birth: 1955-12-29 Referring Provider:     Pulmonary Rehab from 09/11/2017 in [facility] Cardiac and Pulmonary Rehab  Referring Provider  Salome Holmes MD      Encounter Date: 10/18/2017  Check In: Session Check In - 10/18/17 1100      Check-In   Location  [facility]-Cardiac & Pulmonary Rehab    Staff Present  Alberteen Sam, MA, RCEP, CCRP, Exercise Physiologist;Meredith Sherryll Burger, RN BSN;Bynum Mccullars Flavia Shipper    Supervising physician immediately available to respond to emergencies  [facility] immediately available ER MD    Physician(s)  Dr. Burlene Arnt and Alfred Levins    Medication changes reported      No    Fall or balance concerns reported     No    Tobacco Cessation  No Change    Warm-up and Cool-down  Performed as group-led instruction    Resistance Training Performed  Yes    VAD Patient?  No      Pain Assessment   Currently in Pain?  No/denies        Exercise Prescription Changes - 10/17/17 1500      Response to Exercise   Blood Pressure (Admit)  130/80    Blood Pressure (Exit)  142/82    Heart Rate (Admit)  62 bpm    Heart Rate (Exercise)  65 bpm    Heart Rate (Exit)  62 bpm    Oxygen Saturation (Admit)  95 %    Oxygen Saturation (Exercise)  98 %    Oxygen Saturation (Exit)  95 %    Rating of Perceived Exertion (Exercise)  15    Perceived Dyspnea (Exercise)  3    Symptoms  none    Duration  Continue with 45 min of aerobic exercise without signs/symptoms of physical distress.    Intensity  THRR unchanged      Progression   Progression  Continue to progress workloads to maintain intensity without signs/symptoms of physical distress.    Average METs  2.02      Resistance Training   Training Prescription  Yes    Weight  4 lbs    Reps  10-15      Interval Training   Interval Training  No      Oxygen   Oxygen  Continuous    Liters  2      Treadmill   MPH  1    Grade  0    Minutes  15    METs  1.77      NuStep   Level  4    SPM  84    Minutes  15    METs  2.2      Arm Ergometer   Level  1    Minutes  15    METs  2.1      Home Exercise Plan   Plans to continue exercise at  Home (comment) walking with dog and at stores    Frequency  Add 1 additional day to program exercise sessions.    Initial Home Exercises Provided  10/04/17       Social History   Tobacco Use  Smoking Status Current Every Day Smoker  . Packs/day: 1.00  . Years: 40.00  . Pack years: 40.00  . Types: Cigarettes  Smokeless Tobacco Never Used  Tobacco Comment   he is interested but doesnt want to quit due to him mother passing away  Goals Met:  Independence with exercise equipment Personal goals reviewed No report of cardiac concerns or symptoms Strength training completed today  Goals Unmet:  Not Applicable  Comments: Pt able to follow exercise prescription today without complaint.  Will continue to monitor for progression.   Dr. Emily Filbert is Medical Director for [place] and [facility].

## 2017-10-20 DIAGNOSIS — J449 Chronic obstructive pulmonary disease, unspecified: Secondary | ICD-10-CM

## 2017-10-20 NOTE — Progress Notes (Signed)
Daily Session Note  Patient Details  Name: Kyle Rich MRN: [id number] Date of Birth: [birth year] Referring Provider:     Pulmonary Rehab from 09/11/2017 in [facility] Cardiac and Pulmonary Rehab  Referring Provider  Salome Holmes MD      Encounter Date: 10/20/2017  Check In: Session Check In - 10/20/17 1123      Check-In   Location  [facility]-Cardiac & Pulmonary Rehab    Staff Present  Renita Papa, RN BSN;Angellica Maddison Darrin Nipper, [place], RCEP, [place], Exercise Physiologist    Supervising physician immediately available to respond to emergencies  [facility] immediately available ER MD    Physician(s)  Dr. Jacqualine Code and Quentin Cornwall    Medication changes reported      No    Fall or balance concerns reported     No    Tobacco Cessation  No Change    Warm-up and Cool-down  Performed as group-led instruction    Resistance Training Performed  Yes    VAD Patient?  No      Pain Assessment   Currently in Pain?  No/denies          Social History   Tobacco Use  Smoking Status Current Every Day Smoker  . Packs/day: 1.00  . Years: 40.00  . Pack years: 40.00  . Types: Cigarettes  Smokeless Tobacco Never Used  Tobacco Comment   he is interested but doesnt want to quit due to him mother passing away    Goals Met:  Independence with exercise equipment Exercise tolerated well No report of cardiac concerns or symptoms Strength training completed today  Goals Unmet:  Not Applicable  Comments: Pt able to follow exercise prescription today without complaint.  Will continue to monitor for progression.   Dr. Emily Filbert is Medical Director for [place] and [facility].

## 2017-10-25 ENCOUNTER — Telehealth: Payer: Self-pay | Admitting: *Deleted

## 2017-10-25 NOTE — Telephone Encounter (Signed)
Maurine MinisterDennis called to inform staff that he is moving this week and unable to come to class.

## 2017-10-31 ENCOUNTER — Telehealth: Payer: Self-pay

## 2017-10-31 ENCOUNTER — Ambulatory Visit: Payer: Medicaid Other | Admitting: Dietician

## 2017-10-31 DIAGNOSIS — J449 Chronic obstructive pulmonary disease, unspecified: Secondary | ICD-10-CM

## 2017-10-31 NOTE — Telephone Encounter (Signed)
Called patients cell phone. A women answered and was told to call the house number. No answer after call was made to the house. Message left for patient to call back.

## 2017-11-02 ENCOUNTER — Telehealth: Payer: Self-pay

## 2017-11-02 DIAGNOSIS — J449 Chronic obstructive pulmonary disease, unspecified: Secondary | ICD-10-CM

## 2017-11-02 NOTE — Telephone Encounter (Signed)
Kyle Rich states he is trying to move and will be back soon. He is going to let us know if he can continue to get rides to class and if not he will call us back. He wants to continue the program. Will check on patient if he does not attended [facility].

## 2017-11-03 ENCOUNTER — Encounter: Payer: Medicaid Other | Attending: Family Medicine

## 2017-11-03 DIAGNOSIS — J449 Chronic obstructive pulmonary disease, unspecified: Secondary | ICD-10-CM | POA: Insufficient documentation

## 2017-11-08 ENCOUNTER — Encounter: Payer: Medicaid Other | Admitting: *Deleted

## 2017-11-08 DIAGNOSIS — J449 Chronic obstructive pulmonary disease, unspecified: Secondary | ICD-10-CM

## 2017-11-08 NOTE — Progress Notes (Signed)
Daily Session Note  Patient Details  Name: Kyle Rich MRN: [id number] Date of Birth: 08-28-1956 Referring Provider:     Pulmonary Rehab from 09/11/2017 in [facility] Cardiac and Pulmonary Rehab  Referring Provider  Salome Holmes MD      Encounter Date: 11/08/2017  Check In: Session Check In - 11/08/17 1127      Check-In   Location  [facility]-Cardiac & Pulmonary Rehab    Staff Present  Renita Papa, RN BSN;Jessica Luan Pulling, MA, RCEP, CCRP, Exercise Physiologist;Joseph Flavia Shipper    Supervising physician immediately available to respond to emergencies  [facility] immediately available ER MD    Physician(s)  Dr. Joni Fears and Jimmye Norman     Medication changes reported      No    Fall or balance concerns reported     No    Warm-up and Cool-down  Performed as group-led instruction    Resistance Training Performed  Yes    VAD Patient?  No      Pain Assessment   Currently in Pain?  No/denies          Social History   Tobacco Use  Smoking Status Current Every Day Smoker  . Packs/day: 1.00  . Years: 40.00  . Pack years: 40.00  . Types: Cigarettes  Smokeless Tobacco Never Used  Tobacco Comment   he is interested but doesnt want to quit due to him mother passing away    Goals Met:  Proper associated with RPD/PD & O2 Sat Independence with exercise equipment Using PLB without cueing & demonstrates good technique Exercise tolerated well Strength training completed today  Goals Unmet:  Not Applicable  Comments: Pt able to follow exercise prescription today without complaint.  Will continue to monitor for progression.    Dr. Emily Filbert is Medical Director for [place] and [facility].

## 2017-11-10 ENCOUNTER — Encounter: Payer: Medicaid Other | Admitting: *Deleted

## 2017-11-10 DIAGNOSIS — J449 Chronic obstructive pulmonary disease, unspecified: Secondary | ICD-10-CM | POA: Diagnosis not present

## 2017-11-10 NOTE — Progress Notes (Signed)
Daily Session Note  Patient Details  Name: MAN EFFERTZ MRN: [id number] Date of Birth: 03-19-1956 Referring Provider:     Pulmonary Rehab from 09/11/2017 in [facility] Cardiac and Pulmonary Rehab  Referring Provider  Salome Holmes MD      Encounter Date: 11/10/2017  Check In: Session Check In - 11/10/17 1158      Check-In   Location  [facility]-Cardiac & Pulmonary Rehab    Staff Present  Renita Papa, RN BSN;Jessica Luan Pulling, MA, RCEP, CCRP, Exercise Physiologist;Mary Kellie Shropshire, RN, BSN, MA    Supervising physician immediately available to respond to emergencies  [facility] immediately available ER MD    Physician(s)  Dr. Reita Cliche and Archie Balboa     Medication changes reported      No    Fall or balance concerns reported     No    Tobacco Cessation  No Change    Warm-up and Cool-down  Performed as group-led instruction    Resistance Training Performed  Yes    VAD Patient?  No      Pain Assessment   Currently in Pain?  No/denies          Social History   Tobacco Use  Smoking Status Current Every Day Smoker  . Packs/day: 1.00  . Years: 40.00  . Pack years: 40.00  . Types: Cigarettes  Smokeless Tobacco Never Used  Tobacco Comment   he is interested but doesnt want to quit due to him mother passing away    Goals Met:  Proper associated with RPD/PD & O2 Sat Independence with exercise equipment Using PLB without cueing & demonstrates good technique Exercise tolerated well Strength training completed today  Goals Unmet:  Not Applicable  Comments: Pt able to follow exercise prescription today without complaint.  Will continue to monitor for progression.    Dr. Emily Filbert is Medical Director for [place] and [facility].

## 2017-11-13 DIAGNOSIS — J449 Chronic obstructive pulmonary disease, unspecified: Secondary | ICD-10-CM

## 2017-11-13 NOTE — Progress Notes (Signed)
Pulmonary Individual Treatment Plan  Patient Details  Name: Kyle Rich MRN: [id number] Date of Birth: 02/15/1956 Referring Provider:     Pulmonary Rehab from 09/11/2017 in [facility] Cardiac and Pulmonary Rehab  Referring Provider  Salome Holmes MD      Initial Encounter Date:    Pulmonary Rehab from 09/11/2017 in [facility] Cardiac and Pulmonary Rehab  Date  09/11/17  Referring Provider  Salome Holmes MD      Visit Diagnosis: Chronic obstructive pulmonary disease, unspecified COPD type ([place])  Patient's Home Medications on Admission:  Current Outpatient Medications:  .  albuterol (PROAIR HFA) 108 (90 BASE) MCG/ACT inhaler, Inhale 2 puffs into the lungs every 6 (six) hours as needed for wheezing. , Disp: , Rfl:  .  ALPRAZolam (XANAX) 0.25 MG tablet, Take 1 tablet (0.25 mg total) 2 (two) times daily as needed by mouth for anxiety., Disp: 20 tablet, Rfl: 0 .  aspirin EC 325 MG EC tablet, Take 1 tablet (325 mg total) by mouth daily., Disp: 30 tablet, Rfl: 0 .  atorvastatin (LIPITOR) 40 MG tablet, Take 1 tablet (40 mg total) by mouth daily at 6 PM., Disp: , Rfl:  .  chlorpheniramine-HYDROcodone (TUSSIONEX) 10-8 MG/5ML SUER, Take 5 mLs every 12 (twelve) hours by mouth., Disp: 115 mL, Rfl: 0 .  escitalopram (LEXAPRO) 20 MG tablet, Take 1 tablet (20 mg total) at bedtime by mouth., Disp: 30 tablet, Rfl: 2 .  Fluticasone-Salmeterol (ADVAIR DISKUS) 500-50 MCG/DOSE AEPB, Inhale 1 puff every 12 (twelve) hours into the lungs., Disp: 60 each, Rfl: 2 .  ipratropium-albuterol (DUONEB) 0.5-2.5 (3) MG/3ML SOLN, Take 3 mLs every 6 (six) hours as needed by nebulization (wheezing)., Disp: 360 mL, Rfl: 1 .  metFORMIN (GLUCOPHAGE) 500 MG tablet, Take by mouth 2 (two) times daily with a meal., Disp: , Rfl:  .  metoprolol tartrate (LOPRESSOR) 25 MG tablet, Take 1 tablet (25 mg total) by mouth 2 (two) times daily., Disp: , Rfl:  .  nicotine (NICODERM CQ - DOSED IN MG/24 HOURS) 14 mg/24hr patch, Place 1 patch (14 mg total)  daily onto the skin., Disp: 28 patch, Rfl: 0 .  pantoprazole (PROTONIX) 40 MG tablet, Take 1 tablet (40 mg total) daily by mouth., Disp: 30 tablet, Rfl: 2 .  predniSONE (STERAPRED UNI-PAK 21 TAB) 10 MG (21) TBPK tablet, 6 tabs PO x 1 day 5 tabs PO x 1 day 4 tabs PO x 1 day 3 tabs PO x 1 day 2 tabs PO x 1 day 1 tab PO x 1 day and stop, Disp: 21 tablet, Rfl: 0 .  SUBOXONE 8-2 MG FILM, Place 1 Film (8 mg total) under the tongue 2 (two) times daily NADEAN [id number], Disp: , Rfl: 0 .  tiotropium (SPIRIVA HANDIHALER) 18 MCG inhalation capsule, Place 1 capsule (18 mcg total) daily into inhaler and inhale., Disp: 30 capsule, Rfl: 2  Past Medical History: Past Medical History:  Diagnosis Date  . Arthritis   . Asthma   . CHF (congestive heart failure) ([place])   . COPD (chronic obstructive pulmonary disease) ([place])   . Diabetes mellitus without complication ([place])   . Hepatitis C   . Hypertension     Tobacco Use: Social History   Tobacco Use  Smoking Status Current Every Day Smoker  . Packs/day: 1.00  . Years: 40.00  . Pack years: 40.00  . Types: Cigarettes  Smokeless Tobacco Never Used  Tobacco Comment   he is interested but doesnt want to quit due to him  mother passing away    Labs: Recent Review Flowsheet Data    Labs for ITP Cardiac and Pulmonary Rehab Latest Ref Rng & Units 06/09/2015 06/15/2016 07/02/2017 07/03/2017 07/19/2017   Cholestrol 0 - 200 mg/dL - - - - -   LDLCALC 0 - 100 mg/dL - - - - -   HDL 40 - 60 mg/dL - - - - -   Trlycerides 0 - 200 mg/dL - - - - -   Hemoglobin A1c 4.8 - 5.6 % - - - 6.6(H) -   PHART 7.350 - 7.450 - - 7.41 7.38 -   PCO2ART 32.0 - 48.0 mmHg - - 50(H) 48 -   HCO3 20.0 - 28.0 mmol/L 33.1(H) 28.9(H) 31.7(H) 28.4(H) 40.3(H)   O2SAT % - 92.7 98.3 93.4 96.1       Pulmonary Assessment Scores: Pulmonary Assessment Scores    Row Name 09/11/17 1410         ADL UCSD   ADL Phase  Entry     SOB Score total  84     Rest  3     Walk  3     Stairs  5       Bath  4     Dress  3     Shop  4       CAT Score   CAT Score  27       mMRC Score   mMRC Score  3        Pulmonary Function Assessment: Pulmonary Function Assessment - 09/11/17 1428      Initial Spirometry Results   FVC%  33 %    FEV1%  27 %    FEV1/FVC Ratio  60.27    Comments  Best of 2, good patient effort      Post Bronchodilator Spirometry Results   FVC%  38.94 %    FEV1%  32.94 %    FEV1/FVC Ratio  62.12    Comments  Best of 2, good patient effort      Breath   Bilateral Breath Sounds  Wheezes    Shortness of Breath  Limiting activity;Fear of Shortness of Breath;Panic with Shortness of Breath;Yes       Exercise Target Goals:    Exercise Program Goal: Individual exercise prescription set using results from initial 6 min walk test and THRR while considering  patient's activity barriers and safety.    Exercise Prescription Goal: Initial exercise prescription builds to 30-45 minutes a day of aerobic activity, 2-3 days per week.  Home exercise guidelines will be given to patient during program as part of exercise prescription that the participant will acknowledge.  Activity Barriers & Risk Stratification: Activity Barriers & Cardiac Risk Stratification - 09/11/17 1539      Activity Barriers & Cardiac Risk Stratification   Activity Barriers  Deconditioning;Muscular Weakness;Shortness of Breath;Joint Problems;Balance Concerns bilateral hip pain       6 Minute Walk: 6 Minute Walk    Row Name 09/11/17 1530         6 Minute Walk   Phase  Initial     Distance  570 feet     Walk Time  4.65 minutes     # of Rest Breaks  2 1:20, stopped at 5:01     MPH  1.39     METS  1.59     RPE  13     Perceived Dyspnea   2     VO2 Peak  5.55     Symptoms  Yes (comment)     Comments  leg fatigue/heaviness/pain 10/10     Resting HR  67 bpm     Resting BP  146/76     Resting Oxygen Saturation   94 %     Exercise Oxygen Saturation  during 6 min walk  93 %     Max  Ex. HR  85 bpm     Max Ex. BP  154/74     2 Minute Post BP  144/70       Interval HR   1 Minute HR  70     2 Minute HR  74     3 Minute HR  74     4 Minute HR  78     5 Minute HR  85     6 Minute HR  75     2 Minute Post HR  66     Interval Heart Rate?  Yes       Interval Oxygen   Interval Oxygen?  Yes     Baseline Oxygen Saturation %  94 %     1 Minute Oxygen Saturation %  93 %     1 Minute Liters of Oxygen  2 L     2 Minute Oxygen Saturation %  94 %     2 Minute Liters of Oxygen  2 L     3 Minute Oxygen Saturation %  94 %     3 Minute Liters of Oxygen  2 L     4 Minute Oxygen Saturation %  95 %     4 Minute Liters of Oxygen  2 L     5 Minute Oxygen Saturation %  95 %     5 Minute Liters of Oxygen  2 L     6 Minute Oxygen Saturation %  95 %     6 Minute Liters of Oxygen  2 L     2 Minute Post Oxygen Saturation %  96 %     2 Minute Post Liters of Oxygen  2 L       Oxygen Initial Assessment: Oxygen Initial Assessment - 09/11/17 1419      Home Oxygen   Home Oxygen Device  Home Concentrator;E-Tanks    Sleep Oxygen Prescription  Continuous    Liters per minute  2    Home Exercise Oxygen Prescription  Continuous    Liters per minute  2    Home at Rest Exercise Oxygen Prescription  Continuous    Liters per minute  2    Compliance with Home Oxygen Use  Yes      Initial 6 min Walk   Oxygen Used  Continuous    Liters per minute  2      Program Oxygen Prescription   Program Oxygen Prescription  Continuous    Liters per minute  2      Intervention   Short Term Goals  To learn and demonstrate proper use of respiratory medications;To learn and demonstrate proper pursed lip breathing techniques or other breathing techniques.;To learn and understand importance of monitoring SPO2 with pulse oximeter and demonstrate accurate use of the pulse oximeter.;To learn and understand importance of maintaining oxygen saturations>88%;To learn and exhibit compliance with exercise, home and  travel O2 prescription takes albuterol nebulizer, pro air inhaler. Advair and spiriva    Long  Term Goals  Exhibits compliance with exercise, home and travel O2 prescription;Verbalizes  importance of monitoring SPO2 with pulse oximeter and return demonstration;Maintenance of O2 saturations>88%;Exhibits proper breathing techniques, such as pursed lip breathing or other method taught during program session;Compliance with respiratory medication;Demonstrates proper use of MDI's       Oxygen Re-Evaluation: Oxygen Re-Evaluation    Row Name 09/18/17 1125 10/13/17 1212 11/08/17 1217         Program Oxygen Prescription   Program Oxygen Prescription  Continuous  Continuous  Continuous     Liters per minute  '2  2  2       '$ Home Oxygen   Home Oxygen Device  Home Concentrator;E-Tanks  Home Concentrator;E-Tanks  Home Concentrator;E-Tanks     Sleep Oxygen Prescription  Continuous  Continuous  Continuous     Liters per minute  '2  2  2     '$ Home Exercise Oxygen Prescription  Continuous  Continuous  Continuous     Liters per minute  '2  2  2     '$ Home at Rest Exercise Oxygen Prescription  Continuous  Continuous  Continuous     Liters per minute  2  -  2     Compliance with Home Oxygen Use  Yes  Yes  Yes       Goals/Expected Outcomes   Short Term Goals  To learn and demonstrate proper use of respiratory medications;To learn and demonstrate proper pursed lip breathing techniques or other breathing techniques.;To learn and understand importance of monitoring SPO2 with pulse oximeter and demonstrate accurate use of the pulse oximeter.;To learn and understand importance of maintaining oxygen saturations>88%;To learn and exhibit compliance with exercise, home and travel O2 prescription  To learn and demonstrate proper use of respiratory medications;To learn and demonstrate proper pursed lip breathing techniques or other breathing techniques.;To learn and understand importance of monitoring SPO2 with pulse oximeter  and demonstrate accurate use of the pulse oximeter.;To learn and understand importance of maintaining oxygen saturations>88%;To learn and exhibit compliance with exercise, home and travel O2 prescription  To learn and demonstrate proper use of respiratory medications;To learn and demonstrate proper pursed lip breathing techniques or other breathing techniques.;To learn and understand importance of monitoring SPO2 with pulse oximeter and demonstrate accurate use of the pulse oximeter.;To learn and understand importance of maintaining oxygen saturations>88%;To learn and exhibit compliance with exercise, home and travel O2 prescription     Long  Term Goals  Exhibits compliance with exercise, home and travel O2 prescription;Verbalizes importance of monitoring SPO2 with pulse oximeter and return demonstration;Maintenance of O2 saturations>88%;Exhibits proper breathing techniques, such as pursed lip breathing or other method taught during program session;Compliance with respiratory medication;Demonstrates proper use of MDI's  Exhibits compliance with exercise, home and travel O2 prescription;Verbalizes importance of monitoring SPO2 with pulse oximeter and return demonstration;Maintenance of O2 saturations>88%;Exhibits proper breathing techniques, such as pursed lip breathing or other method taught during program session;Compliance with respiratory medication;Demonstrates proper use of MDI's  Exhibits compliance with exercise, home and travel O2 prescription;Verbalizes importance of monitoring SPO2 with pulse oximeter and return demonstration;Maintenance of O2 saturations>88%;Exhibits proper breathing techniques, such as pursed lip breathing or other method taught during program session;Compliance with respiratory medication;Demonstrates proper use of MDI's     Comments  Reviewed PLB technique with pt.  Talked about how it work and it's important to maintaining his exercise saturations.    Kyle Rich states that he checks  his oxygen twice a day at home. He has been working on PLB. He uses his nebulizer 3-4 times a day. He  is wanting to quit smoking but states not that much. Gave patient information about quitting smoking.  He has been moving recently and was out of class for a little while. He states he can tell that he is a little more short of breath. He states when he checked his oxygen at home when he went and got the mail his oxygen level was 88 percent. He would like to try to use 1 liter of oxygen his next session.     Goals/Expected Outcomes  Short: Become more profiecient at using PLB.   Long: Become independent at using PLB.  Short: work on decreasing how many cigarettes he smokes. Long: Quit smoking.  Short: turn oxygen down 1 liter for exercise. Long: decrease oxygen to room air.        Oxygen Discharge (Final Oxygen Re-Evaluation): Oxygen Re-Evaluation - 11/08/17 1217      Program Oxygen Prescription   Program Oxygen Prescription  Continuous    Liters per minute  2      Home Oxygen   Home Oxygen Device  Home Concentrator;E-Tanks    Sleep Oxygen Prescription  Continuous    Liters per minute  2    Home Exercise Oxygen Prescription  Continuous    Liters per minute  2    Home at Rest Exercise Oxygen Prescription  Continuous    Liters per minute  2    Compliance with Home Oxygen Use  Yes      Goals/Expected Outcomes   Short Term Goals  To learn and demonstrate proper use of respiratory medications;To learn and demonstrate proper pursed lip breathing techniques or other breathing techniques.;To learn and understand importance of monitoring SPO2 with pulse oximeter and demonstrate accurate use of the pulse oximeter.;To learn and understand importance of maintaining oxygen saturations>88%;To learn and exhibit compliance with exercise, home and travel O2 prescription    Long  Term Goals  Exhibits compliance with exercise, home and travel O2 prescription;Verbalizes importance of monitoring SPO2 with pulse  oximeter and return demonstration;Maintenance of O2 saturations>88%;Exhibits proper breathing techniques, such as pursed lip breathing or other method taught during program session;Compliance with respiratory medication;Demonstrates proper use of MDI's    Comments  He has been moving recently and was out of class for a little while. He states he can tell that he is a little more short of breath. He states when he checked his oxygen at home when he went and got the mail his oxygen level was 88 percent. He would like to try to use 1 liter of oxygen his next session.    Goals/Expected Outcomes  Short: turn oxygen down 1 liter for exercise. Long: decrease oxygen to room air.       Initial Exercise Prescription: Initial Exercise Prescription - 09/11/17 1500      Date of Initial Exercise RX and Referring Provider   Date  09/11/17    Referring Provider  Salome Holmes MD      Oxygen   Oxygen  Continuous    Liters  2      Treadmill   MPH  1    Grade  0    Minutes  15    METs  1.77      NuStep   Level  1    SPM  80    Minutes  15    METs  1.7      Arm Ergometer   Level  1    Watts  22    RPM  25    Minutes  15    METs  1.7      Prescription Details   Frequency (times per week)  3    Duration  Progress to 45 minutes of aerobic exercise without signs/symptoms of physical distress      Intensity   THRR 40-80% of Max Heartrate  103-139    Ratings of Perceived Exertion  11-15    Perceived Dyspnea  0-4      Progression   Progression  Continue to progress workloads to maintain intensity without signs/symptoms of physical distress.      Resistance Training   Training Prescription  Yes    Weight  3 lbs    Reps  10-15       Perform Capillary Blood Glucose checks as needed.  Exercise Prescription Changes: Exercise Prescription Changes    Row Name 09/11/17 1500 09/19/17 1600 10/04/17 1200 10/17/17 1500 10/31/17 1200     Response to Exercise   Blood Pressure (Admit)  146/76   104/64  126/62  130/80  134/72   Blood Pressure (Exercise)  154/74  132/76  -  -  -   Blood Pressure (Exit)  144/70  -  112/70  142/82  130/68   Heart Rate (Admit)  67 bpm  61 bpm  70 bpm  62 bpm  68 bpm   Heart Rate (Exercise)  85 bpm  79 bpm  86 bpm  65 bpm  68 bpm   Heart Rate (Exit)  66 bpm  -  66 bpm  62 bpm  60 bpm   Oxygen Saturation (Admit)  94 %  95 %  93 %  95 %  95 %   Oxygen Saturation (Exercise)  93 %  94 %  94 %  98 %  97 %   Oxygen Saturation (Exit)  96 %  -  96 %  95 %  96 %   Rating of Perceived Exertion (Exercise)  '13  13  12  15  13   '$ Perceived Dyspnea (Exercise)  '2  4  1  3  2   '$ Symptoms  leg fatigue/heaviness/pain 10/10  SOB, fatigue  none  none  none   Comments  walk test results  first full day of exercise  -  -  -   Duration  -  Progress to 45 minutes of aerobic exercise without signs/symptoms of physical distress  Continue with 45 min of aerobic exercise without signs/symptoms of physical distress.  Continue with 45 min of aerobic exercise without signs/symptoms of physical distress.  Continue with 45 min of aerobic exercise without signs/symptoms of physical distress.   Intensity  -  THRR unchanged  THRR unchanged  THRR unchanged  THRR unchanged     Progression   Progression  -  Continue to progress workloads to maintain intensity without signs/symptoms of physical distress.  Continue to progress workloads to maintain intensity without signs/symptoms of physical distress.  Continue to progress workloads to maintain intensity without signs/symptoms of physical distress.  Continue to progress workloads to maintain intensity without signs/symptoms of physical distress.   Average METs  -  1.65  2  2.02  2.24     Resistance Training   Training Prescription  -  Yes  Yes  Yes  Yes   Weight  -  3 lbs  3 lbs  4 lbs  4 lbs   Reps  -  10-15  10-15  10-15  10-15  Interval Training   Interval Training  -  No  No  No  No     Oxygen   Oxygen  -  Continuous  Continuous   Continuous  Continuous   Liters  -  '2  2  2  2     '$ Treadmill   MPH  -  1  0.8  1  1.2   Grade  -  0  0  0  0   Minutes  -  '15  15  15  15   '$ METs  -  1.77  1.7  1.77  1.92     NuStep   Level  -  '1  2  4  6   '$ SPM  -  49  84  84  60   Minutes  -  '15  15  15  15   '$ METs  -  1.6  2.1  2.2  2.5     Arm Ergometer   Level  -  -  '1  1  2   '$ RPM  -  -  36  -  43   Minutes  -  -  '15  15  15   '$ METs  -  -  2.2  2.1  2.3     Home Exercise Plan   Plans to continue exercise at  -  -  Home (comment) walking with dog and at stores  Home (comment) walking with dog and at stores  Home (comment) walking with dog and at stores   Frequency  -  -  Add 1 additional day to program exercise sessions.  Add 1 additional day to program exercise sessions.  Add 1 additional day to program exercise sessions.   Initial Home Exercises Provided  -  -  10/04/17  10/04/17  10/04/17      Exercise Comments: Exercise Comments    Row Name 09/18/17 1124           Exercise Comments  First full day of exercise!  Patient was oriented to gym and equipment including functions, settings, policies, and procedures.  Patient's individual exercise prescription and treatment plan were reviewed.  All starting workloads were established based on the results of the 6 minute walk test done at initial orientation visit.  The plan for exercise progression was also introduced and progression will be customized based on patient's performance and goals.          Exercise Goals and Review: Exercise Goals    Row Name 09/11/17 1544             Exercise Goals   Increase Physical Activity  Yes       Intervention  Provide advice, education, support and counseling about physical activity/exercise needs.;Develop an individualized exercise prescription for aerobic and resistive training based on initial evaluation findings, risk stratification, comorbidities and participant's personal goals.       Expected Outcomes  Achievement of  increased cardiorespiratory fitness and enhanced flexibility, muscular endurance and strength shown through measurements of functional capacity and personal statement of participant.       Increase Strength and Stamina  Yes       Intervention  Provide advice, education, support and counseling about physical activity/exercise needs.;Develop an individualized exercise prescription for aerobic and resistive training based on initial evaluation findings, risk stratification, comorbidities and participant's personal goals.       Expected Outcomes  Achievement of increased cardiorespiratory fitness and enhanced flexibility, muscular endurance and  strength shown through measurements of functional capacity and personal statement of participant.       Able to understand and use rate of perceived exertion (RPE) scale  Yes       Intervention  Provide education and explanation on how to use RPE scale       Expected Outcomes  Short Term: Able to use RPE daily in rehab to express subjective intensity level;Long Term:  Able to use RPE to guide intensity level when exercising independently       Able to understand and use Dyspnea scale  Yes       Intervention  Provide education and explanation on how to use Dyspnea scale       Expected Outcomes  Short Term: Able to use Dyspnea scale daily in rehab to express subjective sense of shortness of breath during exertion;Long Term: Able to use Dyspnea scale to guide intensity level when exercising independently       Knowledge and understanding of Target Heart Rate Range (THRR)  Yes       Intervention  Provide education and explanation of THRR including how the numbers were predicted and where they are located for reference       Expected Outcomes  Short Term: Able to state/look up THRR;Long Term: Able to use THRR to govern intensity when exercising independently;Short Term: Able to use daily as guideline for intensity in rehab       Able to check pulse independently  Yes         Intervention  Provide education and demonstration on how to check pulse in carotid and radial arteries.;Review the importance of being able to check your own pulse for safety during independent exercise       Expected Outcomes  Short Term: Able to explain why pulse checking is important during independent exercise;Long Term: Able to check pulse independently and accurately       Understanding of Exercise Prescription  Yes       Intervention  Provide education, explanation, and written materials on patient's individual exercise prescription       Expected Outcomes  Short Term: Able to explain program exercise prescription;Long Term: Able to explain home exercise prescription to exercise independently          Exercise Goals Re-Evaluation : Exercise Goals Re-Evaluation    Row Name 09/18/17 1124 09/19/17 1612 10/04/17 1223 10/17/17 1456 10/31/17 1246     Exercise Goal Re-Evaluation   Exercise Goals Review  Understanding of Exercise Prescription;Able to understand and use Dyspnea scale;Knowledge and understanding of Target Heart Rate Range (THRR);Able to understand and use rate of perceived exertion (RPE) scale  Increase Physical Activity;Increase Strength and Stamina;Understanding of Exercise Prescription  Increase Physical Activity;Increase Strength and Stamina;Understanding of Exercise Prescription;Able to understand and use rate of perceived exertion (RPE) scale;Able to check pulse independently;Knowledge and understanding of Target Heart Rate Range (THRR);Able to understand and use Dyspnea scale  Increase Physical Activity;Increase Strength and Stamina;Understanding of Exercise Prescription  Increase Physical Activity;Increase Strength and Stamina;Understanding of Exercise Prescription   Comments  Reviewed RPE scale, THR and program prescription with pt today.  Pt voiced understanding and was given a copy of goals to take home.   Kyle Rich has completed his first full day of exercise.  We will  continue to monitor his progression.   Kyle Rich is off to a good start in rehab.  He is already feeling stronger and has more stamina.  He says that he is feeling better overall.  Currently, he is not doing any home exercise so we talked about finding ways to carve out some time to get his home exercise.  Reviewed home exercise with pt today.  Pt plans to walk at home with dog and stores when doing his grocery shopping for exercise.  Reviewed THR, pulse, RPE, sign and symptoms, and when to call 911 or MD.  Also discussed weather considerations and indoor options.  Pt voiced understanding.  Kyle Rich has been doing well in rehab. He now up to 1.0 mph on the treadmill!  We will continue to encourage him to walk more at home.   We will continue to monitor his pogress.  Kyle Rich has been out since 2/15.  He had worked up to level 2 on the arm crank.  Once he returns, we will continue to work on increasing workloads.  We will continue to monitor his progression.    Expected Outcomes  Short: Use RPE daily to regulate intensity.  Long: Follow program prescription in [facility].  Short: Attend rehab regularly.  Long: Follow program prescription.   Short: Add in at least one extra day at week at home.  Long: Continue to increase physical activity.   Short: Increase speed on treadmill.  Long: Continue to increase activity at home.   Short: Return to rehab regularly.  Long: Continue to try to increase activity at home to build strength and stamina.   [place] Name 11/08/17 1220             Exercise Goal Re-Evaluation   Exercise Goals Review  Increase Physical Activity;Increase Strength and Stamina       Comments  Kyle Rich is going to try to use 1 liter of oxygen while doing his exercises. He will try to exercise at home but he is in the process of moving. He wants to increase his strength and stamina.       Expected Outcomes  Short: keep oxygen saturation above 88 percent while exercising. Long: maintain oxygen sats above 88 percent on  room air post [facility].          Discharge Exercise Prescription (Final Exercise Prescription Changes): Exercise Prescription Changes - 10/31/17 1200      Response to Exercise   Blood Pressure (Admit)  134/72    Blood Pressure (Exit)  130/68    Heart Rate (Admit)  68 bpm    Heart Rate (Exercise)  68 bpm    Heart Rate (Exit)  60 bpm    Oxygen Saturation (Admit)  95 %    Oxygen Saturation (Exercise)  97 %    Oxygen Saturation (Exit)  96 %    Rating of Perceived Exertion (Exercise)  13    Perceived Dyspnea (Exercise)  2    Symptoms  none    Duration  Continue with 45 min of aerobic exercise without signs/symptoms of physical distress.    Intensity  THRR unchanged      Progression   Progression  Continue to progress workloads to maintain intensity without signs/symptoms of physical distress.    Average METs  2.24      Resistance Training   Training Prescription  Yes    Weight  4 lbs    Reps  10-15      Interval Training   Interval Training  No      Oxygen   Oxygen  Continuous    Liters  2      Treadmill   MPH  1.2    Grade  0  Minutes  15    METs  1.92      NuStep   Level  6    SPM  60    Minutes  15    METs  2.5      Arm Ergometer   Level  2    RPM  43    Minutes  15    METs  2.3      Home Exercise Plan   Plans to continue exercise at  Home (comment) walking with dog and at stores    Frequency  Add 1 additional day to program exercise sessions.    Initial Home Exercises Provided  10/04/17       Nutrition:  Target Goals: Understanding of nutrition guidelines, daily intake of sodium '1500mg'$ , cholesterol '200mg'$ , calories 30% from fat and 7% or less from saturated fats, daily to have 5 or more servings of fruits and vegetables.  Biometrics: Pre Biometrics - 09/11/17 1545      Pre Biometrics   Height  5' 3.5" (1.613 m)    Weight  196 lb 11.2 oz (89.2 kg)    Waist Circumference  43 inches    Hip Circumference  42 inches    Waist to Hip Ratio  1.02 %     BMI (Calculated)  34.29        Nutrition Therapy Plan and Nutrition Goals: Nutrition Therapy & Goals - 09/11/17 1409      Personal Nutrition Goals   Comments  Patients wants to lose weight and eat healthier. He would like to meet with the dietician.      Intervention Plan   Intervention  Prescribe, educate and counsel regarding individualized specific dietary modifications aiming towards targeted core components such as weight, hypertension, lipid management, diabetes, heart failure and other comorbidities.;Nutrition handout(s) given to patient.    Expected Outcomes  Short Term Goal: Understand basic principles of dietary content, such as calories, fat, sodium, cholesterol and nutrients.;Long Term Goal: Adherence to prescribed nutrition plan.       Nutrition Assessments:   Nutrition Goals Re-Evaluation: Nutrition Goals Re-Evaluation    Kyle Rich Name 10/13/17 1158 11/08/17 1223           Goals   Current Weight  192 lb (87.1 kg)  193 lb (87.5 kg)      Nutrition Goal  Lose wieght, eat healthier and Meet with the Dietician.  Lose some weight, eat healthier meals and Meet with the dietician.      Comment  Kyle Rich wants to eat healthier and is willing to make changes in his diet. Money is a factor in his eating decisions. Made an appointment to meet with the dietician on Feb 18th   Made an appointment with the dietician on March 27th.       Expected Outcome  Short: meet with the dietician. Long: adhere to a diet plan.   Short: meet with the dietician. Long: adhere to a diet plan.         Nutrition Goals Discharge (Final Nutrition Goals Re-Evaluation): Nutrition Goals Re-Evaluation - 11/08/17 1223      Goals   Current Weight  193 lb (87.5 kg)    Nutrition Goal  Lose some weight, eat healthier meals and Meet with the dietician.    Comment  Made an appointment with the dietician on March 27th.     Expected Outcome  Short: meet with the dietician. Long: adhere to a diet plan.        Psychosocial: Target  Goals: Acknowledge presence or absence of significant depression and/or stress, maximize coping skills, provide positive support system. Participant is able to verbalize types and ability to use techniques and skills needed for reducing stress and depression.   Initial Review & Psychosocial Screening: Initial Psych Review & Screening - 09/11/17 1404      Initial Review   Current issues with  Current Depression;History of Depression;Current Psychotropic Meds;Current Sleep Concerns;Current Stress Concerns    Source of Stress Concerns  Chronic Illness;Family;Unable to perform yard/household activities;Unable to participate in former interests or hobbies    Comments  His mother died about three weeks ago and it has  been really hard on him.      Family Dynamics   Good Support System?  No    Strains  Intra-family strains    Comments  He blames his sister for putting his mom in a home. He feels like she was not getting the care she needed when she was in the home and thats what killed her.      Barriers   Psychosocial barriers to participate in program  The patient should benefit from training in stress management and relaxation.      Screening Interventions   Interventions  Yes;Encouraged to exercise;Program counselor consult;To provide support and resources with identified psychosocial needs;Provide feedback about the scores to participant    Expected Outcomes  Short Term goal: Utilizing psychosocial counselor, staff and physician to assist with identification of specific Stressors or current issues interfering with healing process. Setting desired goal for each stressor or current issue identified.;Long Term Goal: Stressors or current issues are controlled or eliminated.;Short Term goal: Identification and review with participant of any Quality of Life or Depression concerns found by scoring the questionnaire.;Long Term goal: The participant improves quality of Life and  PHQ9 Scores as seen by post scores and/or verbalization of changes       Quality of Life Scores:  Scores of 19 and below usually indicate a poorer quality of life in these areas.  A difference of  2-3 points is a clinically meaningful difference.  A difference of 2-3 points in the total score of the Quality of Life Index has been associated with significant improvement in overall quality of life, self-image, physical symptoms, and general health in studies assessing change in quality of life.  PHQ-9: Recent Review Flowsheet Data    Depression screen [facility] 2/9 09/11/2017   Decreased Interest 2   Down, Depressed, Hopeless 2   PHQ - 2 Score 4   Altered sleeping 3   Tired, decreased energy 3   Change in appetite 2   Feeling bad or failure about yourself  3   Trouble concentrating 2   Moving slowly or fidgety/restless 1   Suicidal thoughts 2    PHQ-9 Score 20   Difficult doing work/chores Somewhat difficult     Interpretation of Total Score  Total Score Depression Severity:  1-4 = Minimal depression, 5-9 = Mild depression, 10-14 = Moderate depression, 15-19 = Moderately severe depression, 20-27 = Severe depression   Psychosocial Evaluation and Intervention: Psychosocial Evaluation - 10/02/17 1225      Psychosocial Evaluation & Interventions   Interventions  Encouraged to exercise with the program and follow exercise prescription;Stress management education;Relaxation education    Comments  Counselor met with Kyle Rich) today for initial psychosocial evaluation.  He is a 62 year old who has COPD and a recent heart attack and stents inserted.  Finnean also was recently  diagnosed with diabetes as well.  He has a limited support system with a daughter and her child who lives with him, and some involvement in his local church.  His mother was living with him until she passed recently and there is some conflict with his siblings surrounding that currently.  Anuel reports not sleeping well but  having a good appetite.  Mcarthur admits to depression currently as he states his mother was his "best friend".  He has been seeing a psychiatrist in [facility] for these mood issues.  He is also on several meds for depression and anxiety - however his current PHQ-9 is "20" indicating Severe Depression at this time.  Joesiah also has multiple stressors with his health, the conflict with his siblings and severe financial issues.  He states he will likely be "kicked out" of his rental home soon due to inability to pay rent - and he has no money for heat in his house at this time.  Counselor provided Melody with information on [employer] for assistance.  Counselor also recommended [facility] support groups to him as he struggles through the loss of his mother.  Yu checked that he has "thoughts that he would be better off dead."  Counselor assessed with Gurley stating he has no plan and is just feeling sad mostly missing his mother. He is followed by his psychiatrist and he confirmed a plan if these thoughts became worse.  Counselor will follow with Jeneen Rinks on all of this.     Expected Outcomes  Lemoyne will benefit from consistent exercise to achieve his goals of breathing better and having a  better quality of life.  He will contact [address] for financial and housing assistance.  He will look into Grief Share or Hospice for grief support and will follow with his psychiatrist for mood stabilization.      Continue Psychosocial Services   Follow up required by counselor       Psychosocial Re-Evaluation: Psychosocial Re-Evaluation    [place] Name 10/13/17 1154 11/08/17 1228           Psychosocial Re-Evaluation   Current issues with  History of Depression;Current Depression;Current Stress Concerns  History of Depression;Current Depression;Current Stress Concerns      Comments  Kyle Rich is trying to pay bills and is having a hard time. He is also having trouble getting a ride to [place]. He is having trouble telling [employer]  that he needs his ride to [place] so he can improve his breathing.   Kyle Rich is trying to move and is having a lot of trouble getting all the information together for his daughter and her daughter. His ride to [place] has been difficult. He has to use [employer] to get here. He has bought a moped and is trying to fix it up so he can drive himself. He got his license recently and is proud of that. His rides for [employer] have been doing ok but with all of his appointments he has missed a few rides. He can only miss three rides in a month before they stop picking him up.      Expected Outcomes  Short: Call [employer] to get rides to [place]. Long: Maintain getting rides to [facility] to reduce stress.  Short: attened [facility] to decrease stress. Long: maintain an exercise routine to minimize stress.      Interventions  Encouraged to attend Pulmonary Rehabilitation for the exercise  Encouraged to attend Pulmonary Rehabilitation for the exercise;Stress management education;Relaxation education  Continue Psychosocial Services   Follow up required by staff  Follow up required by staff         Psychosocial Discharge (Final Psychosocial Re-Evaluation): Psychosocial Re-Evaluation - 11/08/17 1228      Psychosocial Re-Evaluation   Current issues with  History of Depression;Current Depression;Current Stress Concerns    Comments  Kyle Rich is trying to move and is having a lot of trouble getting all the information together for his daughter and her daughter. His ride to [place] has been difficult. He has to use [employer] to get here. He has bought a moped and is trying to fix it up so he can drive himself. He got his license recently and is proud of that. His rides for [employer] have been doing ok but with all of his appointments he has missed a few rides. He can only miss three rides in a month before they stop picking him up.    Expected Outcomes  Short: attened [facility] to decrease stress. Long: maintain an exercise routine to  minimize stress.    Interventions  Encouraged to attend Pulmonary Rehabilitation for the exercise;Stress management education;Relaxation education    Continue Psychosocial Services   Follow up required by staff       Education: Education Goals: Education classes will be provided on a weekly basis, covering required topics. Participant will state understanding/return demonstration of topics presented.  Learning Barriers/Preferences: Learning Barriers/Preferences - 09/11/17 1412      Learning Barriers/Preferences   Learning Barriers  None    Learning Preferences  None       Education Topics:  Initial Evaluation Education: - Verbal, written and demonstration of respiratory meds, oximetry and breathing techniques. Instruction on use of nebulizers and MDIs and importance of monitoring MDI activations.   Pulmonary Rehab from 11/08/2017 in [facility] Cardiac and Pulmonary Rehab  Date  09/11/17  Educator  [facility]  Instruction Review Code  1- Verbalizes Understanding      General Nutrition Guidelines/Fats and Fiber: -Group instruction provided by verbal, written material, models and posters to present the general guidelines for heart healthy nutrition. Gives an explanation and review of dietary fats and fiber.   Pulmonary Rehab from 11/08/2017 in [facility] Cardiac and Pulmonary Rehab  Date  10/02/17  Educator  [facility]  Instruction Review Code  1- Verbalizes Understanding      Controlling Sodium/Reading Food Labels: -Group verbal and written material supporting the discussion of sodium use in heart healthy nutrition. Review and explanation with models, verbal and written materials for utilization of the food label.   Exercise Physiology & General Exercise Guidelines: - Group verbal and written instruction with models to review the exercise physiology of the cardiovascular system and associated critical values. Provides general exercise guidelines with specific guidelines to those with heart or lung disease.      Aerobic Exercise & Resistance Training: - Gives group verbal and written instruction on the various components of exercise. Focuses on aerobic and resistive training programs and the benefits of this training and how to safely progress through these programs.   Pulmonary Rehab from 11/08/2017 in [facility] Cardiac and Pulmonary Rehab  Date  09/22/17  Educator  [facility]  Instruction Review Code  1- Verbalizes Understanding      Flexibility, Balance, Mind/Body Relaxation: Provides group verbal/written instruction on the benefits of flexibility and balance training, including mind/body exercise modes such as yoga, pilates and tai chi.  Demonstration and skill practice provided.   Pulmonary Rehab from 11/08/2017 in [facility] Cardiac  and Pulmonary Rehab  Date  10/18/17  Educator  [facility]  Instruction Review Code  1- Verbalizes Understanding      Stress and Anxiety: - Provides group verbal and written instruction about the health risks of elevated stress and causes of high stress.  Discuss the correlation between heart/lung disease and anxiety and treatment options. Review healthy ways to manage with stress and anxiety.   Pulmonary Rehab from 11/08/2017 in [facility] Cardiac and Pulmonary Rehab  Date  11/08/17  Educator  [facility]  Instruction Review Code  1- Verbalizes Understanding      Depression: - Provides group verbal and written instruction on the correlation between heart/lung disease and depressed mood, treatment options, and the stigmas associated with seeking treatment.   Exercise & Equipment Safety: - Individual verbal instruction and demonstration of equipment use and safety with use of the equipment.   Pulmonary Rehab from 11/08/2017 in [facility] Cardiac and Pulmonary Rehab  Date  09/11/17  Educator  [facility]  Instruction Review Code  1- Verbalizes Understanding      Infection Prevention: - Provides verbal and written material to individual with discussion of infection control including proper hand washing and  proper equipment cleaning during exercise session.   Pulmonary Rehab from 11/08/2017 in [facility] Cardiac and Pulmonary Rehab  Date  09/11/17  Educator  [facility]  Instruction Review Code  1- Verbalizes Understanding      Falls Prevention: - Provides verbal and written material to individual with discussion of falls prevention and safety.   Pulmonary Rehab from 11/08/2017 in [facility] Cardiac and Pulmonary Rehab  Date  09/11/17  Educator  [facility]  Instruction Review Code  1- Verbalizes Understanding      Diabetes: - Individual verbal and written instruction to review signs/symptoms of diabetes, desired ranges of glucose level fasting, after meals and with exercise. Advice that pre and post exercise glucose checks will be done for 3 sessions at entry of program.   Chronic Lung Diseases: - Group verbal and written instruction to review updates, respiratory medications, advancements in procedures and treatments. Discuss use of supplemental oxygen including available portable oxygen systems, continuous and intermittent flow rates, concentrators, personal use and safety guidelines. Review proper use of inhaler and spacers. Provide informative websites for self-education.    Energy Conservation: - Provide group verbal and written instruction for methods to conserve energy, plan and organize activities. Instruct on pacing techniques, use of adaptive equipment and posture/positioning to relieve shortness of breath.   Triggers and Exacerbations: - Group verbal and written instruction to review types of environmental triggers and ways to prevent exacerbations. Discuss weather changes, air quality and the benefits of nasal washing. Review warning signs and symptoms to help prevent infections. Discuss techniques for effective airway clearance, coughing, and vibrations.   Pulmonary Rehab from 11/08/2017 in [facility] Cardiac and Pulmonary Rehab  Date  09/20/17  Educator  [facility]  Instruction Review Code  1- Verbalizes Understanding        AED/CPR: - Group verbal and written instruction with the use of models to demonstrate the basic use of the AED with the basic ABC's of resuscitation.   Anatomy and Physiology of the Lungs: - Group verbal and written instruction with the use of models to provide basic lung anatomy and physiology related to function, structure and complications of lung disease.   Anatomy & Physiology of the Heart: - Group verbal and written instruction and models provide basic cardiac anatomy and physiology, with the coronary electrical and arterial systems. Review of  Valvular disease and Heart Failure   Pulmonary Rehab from 11/08/2017 in [facility] Cardiac and Pulmonary Rehab  Date  10/04/17  Educator  [facility]  Instruction Review Code  1- Verbalizes Understanding      Cardiac Medications: - Group verbal and written instruction to review commonly prescribed medications for heart disease. Reviews the medication, class of the drug, and side effects.   Pulmonary Rehab from 11/08/2017 in [facility] Cardiac and Pulmonary Rehab  Date  10/20/17  Educator  [facility]  Instruction Review Code  1- Verbalizes Understanding      Know Your Numbers and Risk Factors: -Group verbal and written instruction about important numbers in your health.  Discussion of what are risk factors and how they play a role in the disease process.  Review of Cholesterol, Blood Pressure, Diabetes, and BMI and the role they play in your overall health.   Sleep Hygiene: -Provides group verbal and written instruction about how sleep can affect your health.  Define sleep hygiene, discuss sleep cycles and impact of sleep habits. Review good sleep hygiene tips.    Other: -Provides group and verbal instruction on various topics (see comments)    Knowledge Questionnaire Score: Knowledge Questionnaire Score - 09/11/17 1412      Knowledge Questionnaire Score   Pre Score  13/18 reviewed with patient        Core Components/Risk Factors/Patient Goals at  Admission: Personal Goals and Risk Factors at Admission - 09/11/17 1422      Core Components/Risk Factors/Patient Goals on Admission    Weight Management  Yes;Weight Loss    Intervention  Weight Management: Develop a combined nutrition and exercise program designed to reach desired caloric intake, while maintaining appropriate intake of nutrient and fiber, sodium and fats, and appropriate energy expenditure required for the weight goal.;Weight Management: Provide education and appropriate resources to help participant work on and attain dietary goals.;Weight Management/Obesity: Establish reasonable short term and long term weight goals.    Admit Weight  196 lb 11.2 oz (89.2 kg)    Goal Weight: Short Term  191 lb (86.6 kg)    Goal Weight: Long Term  180 lb (81.6 kg)    Expected Outcomes  Short Term: Continue to assess and modify interventions until short term weight is achieved;Long Term: Adherence to nutrition and physical activity/exercise program aimed toward attainment of established weight goal;Weight Loss: Understanding of general recommendations for a balanced deficit meal plan, which promotes 1-2 lb weight loss per week and includes a negative energy balance of [phone] kcal/d;Understanding recommendations for meals to include 15-35% energy as protein, 25-35% energy from fat, 35-60% energy from carbohydrates, less than '200mg'$  of dietary cholesterol, 20-35 gm of total fiber daily;Understanding of distribution of calorie intake throughout the day with the consumption of 4-5 meals/snacks    Tobacco Cessation  Yes    Number of packs per day  1 pack a day    Intervention  Assist the participant in steps to quit. Provide individualized education and counseling about committing to Tobacco Cessation, relapse prevention, and pharmacological support that can be provided by physician.;Advice worker, assist with locating and accessing local/national Quit Smoking programs, and support quit date  choice.    Expected Outcomes  Short Term: Will demonstrate readiness to quit, by selecting a quit date.;Short Term: Will quit all tobacco product use, adhering to prevention of relapse plan.;Long Term: Complete abstinence from all tobacco products for at least 12 months from quit date.    Improve shortness of breath with  ADL's  Yes    Intervention  Provide education, individualized exercise plan and daily activity instruction to help decrease symptoms of SOB with activities of daily living.    Expected Outcomes  Short Term: Achieves a reduction of symptoms when performing activities of daily living.    Diabetes  Yes    Intervention  Provide education about signs/symptoms and action to take for hypo/hyperglycemia.;Provide education about proper nutrition, including hydration, and aerobic/resistive exercise prescription along with prescribed medications to achieve blood glucose in normal ranges: Fasting glucose 65-99 mg/dL    Expected Outcomes  Short Term: Participant verbalizes understanding of the signs/symptoms and immediate care of hyper/hypoglycemia, proper foot care and importance of medication, aerobic/resistive exercise and nutrition plan for blood glucose control.;Long Term: Attainment of HbA1C < 7%.    Heart Failure  Yes    Intervention  Provide a combined exercise and nutrition program that is supplemented with education, support and counseling about heart failure. Directed toward relieving symptoms such as shortness of breath, decreased exercise tolerance, and extremity edema.    Expected Outcomes  Improve functional capacity of life;Short term: Attendance in program 2-3 days a week with increased exercise capacity. Reported lower sodium intake. Reported increased fruit and vegetable intake. Reports medication compliance.;Short term: Daily weights obtained and reported for increase. Utilizing diuretic protocols set by physician.;Long term: Adoption of self-care skills and reduction of barriers  for early signs and symptoms recognition and intervention leading to self-care maintenance.    Hypertension  Yes    Intervention  Provide education on lifestyle modifcations including regular physical activity/exercise, weight management, moderate sodium restriction and increased consumption of fresh fruit, vegetables, and low fat dairy, alcohol moderation, and smoking cessation.;Monitor prescription use compliance.    Expected Outcomes  Short Term: Continued assessment and intervention until BP is < 140/96m HG in hypertensive participants. < 130/8109mHG in hypertensive participants with diabetes, heart failure or chronic kidney disease.;Long Term: Maintenance of blood pressure at goal levels.       Core Components/Risk Factors/Patient Goals Review:  Goals and Risk Factor Review    Row Name 10/13/17 1223 10/20/17 1431           Core Components/Risk Factors/Patient Goals Review   Personal Goals Review  Weight Management/Obesity;Hypertension;Tobacco Cessation;Improve shortness of breath with ADL's;Diabetes;Heart Failure  Diabetes      Review  [place] except for when he cannot get a ride. He is a little bit interested in quitting smoking. Informed him of the benifits, told him about the free classes and gave him a packet about how to quit.  JaLanniead questions about his new glucometer and when/how to check his sugar. Handouts were given and reviewed with JaJeneen Rinksbout keeping a log of his readings, normal ranges for diabetics, and a step by step how to check his sugar. He was encouraged to bring his meter in so staff can help him with manipulating equipment.       Expected Outcomes  Short: sign up for a class to quit smoking. Long: Quit smoking.  Short: JaYeidenill learn how to use his glucometer properly and keep track of his CBG readings. Long: Kelon's CBG numbers will become more controlled through diet, exercise, and maintenence.          Core Components/Risk  Factors/Patient Goals at Discharge (Final Review):  Goals and Risk Factor Review - 10/20/17 1431      Core Components/Risk Factors/Patient Goals Review   Personal Goals Review  Diabetes    Review  Plato had questions about his new glucometer and when/how to check his sugar. Handouts were given and reviewed with Jeneen Rinks about keeping a log of his readings, normal ranges for diabetics, and a step by step how to check his sugar. He was encouraged to bring his meter in so staff can help him with manipulating equipment.     Expected Outcomes  Short: Lundy will learn how to use his glucometer properly and keep track of his CBG readings. Long: Lucus's CBG numbers will become more controlled through diet, exercise, and maintenence.        ITP Comments: ITP Comments    Row Name 09/11/17 1350 09/18/17 0838 09/22/17 1140 10/06/17 1159 10/16/17 0938   ITP Comments  Medical Evaluation completed. Chart sent for review and changes to Dr. Emily Filbert Director of [place]. Diagnosis can be found in [facility] encounter 09/11/17  30 day review completed. ITP sent to Dr. Emily Filbert Director of [place]. Continue with ITP unless changes are made by physician.  Artin will not be in class on Monday due to trasportation.  Jerel will not be in class Wednesday 2/6 due to doctor's appointment  30 day review completed. ITP sent to Dr. Emily Filbert Director of [place]. Continue with ITP unless changes are made by physician.   [place] Name 10/31/17 [phone] 11/02/17 0954 11/13/17 0841       ITP Comments  Called patients cell phone. A women answered and was told to call the house number. No answer after call was made to the house. Message left for patient to call back.  Kyle Rich states he is trying to move and will be back soon. He is going to let us know if he can continue to get rides to class and if not he will call us back. He wants to continue the program. Will check on patient if he does not attended [place].  30 day review completed.  ITP sent to Dr. Emily Filbert Director of [place]. Continue with ITP unless changes are made by physician.        Comments: 30 day review

## 2017-11-13 NOTE — Progress Notes (Signed)
Daily Session Note  Patient Details  Name: TEGH FRANEK MRN: [id number] Date of Birth: [birth year] Referring Provider:     Pulmonary Rehab from 09/11/2017 in [facility] Cardiac and Pulmonary Rehab  Referring Provider  Salome Holmes MD      Encounter Date: 11/13/2017  Check In: Session Check In - 11/13/17 1145      Check-In   Location  [facility]-Cardiac & Pulmonary Rehab    Staff Present  Earlean Shawl, BS, ACSM CEP, Exercise Physiologist;Amanda Oletta Darter, BA, ACSM CEP, Exercise Physiologist;Joseph Flavia Shipper    Supervising physician immediately available to respond to emergencies  See telemetry face sheet for immediately available ER MD    Medication changes reported      No    Fall or balance concerns reported     No    Warm-up and Cool-down  Performed on first and last piece of equipment    Resistance Training Performed  Yes    VAD Patient?  No      Pain Assessment   Currently in Pain?  No/denies    Multiple Pain Sites  No          Social History   Tobacco Use  Smoking Status Current Every Day Smoker  . Packs/day: 1.00  . Years: 40.00  . Pack years: 40.00  . Types: Cigarettes  Smokeless Tobacco Never Used  Tobacco Comment   he is interested but doesnt want to quit due to him mother passing away    Goals Met:  Independence with exercise equipment Exercise tolerated well No report of cardiac concerns or symptoms Strength training completed today  Goals Unmet:  Not Applicable  Comments: Pt able to follow exercise prescription today without complaint.  Will continue to monitor for progression.    Dr. Emily Filbert is Medical Director for [place] and [facility].

## 2017-11-15 ENCOUNTER — Encounter: Payer: Medicaid Other | Admitting: *Deleted

## 2017-11-15 DIAGNOSIS — J449 Chronic obstructive pulmonary disease, unspecified: Secondary | ICD-10-CM

## 2017-11-15 NOTE — Progress Notes (Signed)
Daily Session Note  Patient Details  Name: Kyle Rich MRN: [id number] Date of Birth: [birth year] Referring Provider:     Pulmonary Rehab from 09/11/2017 in [facility] Cardiac and Pulmonary Rehab  Referring Provider  Salome Holmes MD      Encounter Date: 11/15/2017  Check In: Session Check In - 11/15/17 1126      Check-In   Location  [facility]-Cardiac & Pulmonary Rehab    Staff Present  Renita Papa, RN BSN;Jessica Luan Pulling, MA, RCEP, CCRP, Exercise Physiologist;Joseph Flavia Shipper    Supervising physician immediately available to respond to emergencies  [facility] immediately available ER MD    Physician(s)  Dr. Reita Cliche and Alfred Levins    Medication changes reported      No    Fall or balance concerns reported     No    Tobacco Cessation  No Change    Warm-up and Cool-down  Performed as group-led instruction    Resistance Training Performed  Yes    VAD Patient?  No      Pain Assessment   Currently in Pain?  No/denies        Exercise Prescription Changes - 11/14/17 1600      Response to Exercise   Blood Pressure (Admit)  124/82    Blood Pressure (Exit)  122/80    Heart Rate (Admit)  115 bpm    Heart Rate (Exercise)  83 bpm    Heart Rate (Exit)  67 bpm    Oxygen Saturation (Admit)  94 %    Oxygen Saturation (Exercise)  93 %    Oxygen Saturation (Exit)  97 %    Rating of Perceived Exertion (Exercise)  13    Perceived Dyspnea (Exercise)  3    Symptoms  SOB    Duration  Continue with 45 min of aerobic exercise without signs/symptoms of physical distress.    Intensity  THRR unchanged      Progression   Progression  Continue to progress workloads to maintain intensity without signs/symptoms of physical distress.    Average METs  2.12      Resistance Training   Training Prescription  Yes    Weight  10 lbs    Reps  10-15      Interval Training   Interval Training  No      Oxygen   Oxygen  Continuous    Liters  2      Treadmill   MPH  1    Grade  0    Minutes  15    METs  1.77      NuStep   Level  7    SPM  60    Minutes  15    METs  2.5      Arm Ergometer   Level  2    Minutes  15    METs  2.1      Home Exercise Plan   Plans to continue exercise at  Home (comment) walking with dog and at stores    Frequency  Add 2 additional days to program exercise sessions.    Initial Home Exercises Provided  10/04/17       Social History   Tobacco Use  Smoking Status Current Every Day Smoker  . Packs/day: 1.00  . Years: 40.00  . Pack years: 40.00  . Types: Cigarettes  Smokeless Tobacco Never Used  Tobacco Comment   he is interested but doesnt want to quit due to him mother passing away  Goals Met:  Proper associated with RPD/PD & O2 Sat Independence with exercise equipment Using PLB without cueing & demonstrates good technique Exercise tolerated well Strength training completed today  Goals Unmet:  Not Applicable  Comments: Pt able to follow exercise prescription today without complaint.  Will continue to monitor for progression.    Dr. Emily Filbert is Medical Director for [place] and [facility].

## 2017-11-24 ENCOUNTER — Telehealth: Payer: Self-pay | Admitting: *Deleted

## 2017-11-24 ENCOUNTER — Inpatient Hospital Stay
Admission: EM | Admit: 2017-11-24 | Discharge: 2017-11-29 | DRG: 190 | Disposition: A | Discharge status: Home / Self Care | Payer: Medicaid Other | Attending: Specialist | Admitting: Specialist

## 2017-11-24 ENCOUNTER — Other Ambulatory Visit: Payer: Self-pay

## 2017-11-24 ENCOUNTER — Encounter: Payer: Self-pay | Admitting: Emergency Medicine

## 2017-11-24 ENCOUNTER — Emergency Department: Payer: Medicaid Other

## 2017-11-24 ENCOUNTER — Inpatient Hospital Stay
Admit: 2017-11-24 | Discharge: 2017-11-24 | Disposition: A | Discharge status: Home / Self Care | Payer: Medicaid Other | Attending: Internal Medicine | Admitting: Internal Medicine

## 2017-11-24 DIAGNOSIS — Z6833 Body mass index (BMI) 33.0-33.9, adult: Secondary | ICD-10-CM | POA: Diagnosis not present

## 2017-11-24 DIAGNOSIS — F17210 Nicotine dependence, cigarettes, uncomplicated: Secondary | ICD-10-CM | POA: Diagnosis present

## 2017-11-24 DIAGNOSIS — I255 Ischemic cardiomyopathy: Secondary | ICD-10-CM | POA: Diagnosis present

## 2017-11-24 DIAGNOSIS — Z8619 Personal history of other infectious and parasitic diseases: Secondary | ICD-10-CM

## 2017-11-24 DIAGNOSIS — K219 Gastro-esophageal reflux disease without esophagitis: Secondary | ICD-10-CM | POA: Diagnosis present

## 2017-11-24 DIAGNOSIS — J9602 Acute respiratory failure with hypercapnia: Secondary | ICD-10-CM

## 2017-11-24 DIAGNOSIS — I5023 Acute on chronic systolic (congestive) heart failure: Secondary | ICD-10-CM | POA: Diagnosis present

## 2017-11-24 DIAGNOSIS — J9601 Acute respiratory failure with hypoxia: Secondary | ICD-10-CM | POA: Diagnosis not present

## 2017-11-24 DIAGNOSIS — F329 Major depressive disorder, single episode, unspecified: Secondary | ICD-10-CM | POA: Diagnosis present

## 2017-11-24 DIAGNOSIS — B1920 Unspecified viral hepatitis C without hepatic coma: Secondary | ICD-10-CM | POA: Diagnosis present

## 2017-11-24 DIAGNOSIS — T380X5A Adverse effect of glucocorticoids and synthetic analogues, initial encounter: Secondary | ICD-10-CM | POA: Diagnosis not present

## 2017-11-24 DIAGNOSIS — F17218 Nicotine dependence, cigarettes, with other nicotine-induced disorders: Secondary | ICD-10-CM | POA: Diagnosis not present

## 2017-11-24 DIAGNOSIS — Z7984 Long term (current) use of oral hypoglycemic drugs: Secondary | ICD-10-CM

## 2017-11-24 DIAGNOSIS — Z7982 Long term (current) use of aspirin: Secondary | ICD-10-CM

## 2017-11-24 DIAGNOSIS — J441 Chronic obstructive pulmonary disease with (acute) exacerbation: Principal | ICD-10-CM | POA: Diagnosis present

## 2017-11-24 DIAGNOSIS — F419 Anxiety disorder, unspecified: Secondary | ICD-10-CM | POA: Diagnosis present

## 2017-11-24 DIAGNOSIS — Z7951 Long term (current) use of inhaled steroids: Secondary | ICD-10-CM

## 2017-11-24 DIAGNOSIS — J810 Acute pulmonary edema: Secondary | ICD-10-CM

## 2017-11-24 DIAGNOSIS — E669 Obesity, unspecified: Secondary | ICD-10-CM | POA: Diagnosis present

## 2017-11-24 DIAGNOSIS — M1990 Unspecified osteoarthritis, unspecified site: Secondary | ICD-10-CM | POA: Diagnosis present

## 2017-11-24 DIAGNOSIS — Z23 Encounter for immunization: Secondary | ICD-10-CM | POA: Diagnosis not present

## 2017-11-24 DIAGNOSIS — J9622 Acute and chronic respiratory failure with hypercapnia: Secondary | ICD-10-CM | POA: Diagnosis present

## 2017-11-24 DIAGNOSIS — Z79899 Other long term (current) drug therapy: Secondary | ICD-10-CM

## 2017-11-24 DIAGNOSIS — I214 Non-ST elevation (NSTEMI) myocardial infarction: Secondary | ICD-10-CM | POA: Diagnosis present

## 2017-11-24 DIAGNOSIS — I2510 Atherosclerotic heart disease of native coronary artery without angina pectoris: Secondary | ICD-10-CM | POA: Diagnosis present

## 2017-11-24 DIAGNOSIS — J209 Acute bronchitis, unspecified: Secondary | ICD-10-CM | POA: Diagnosis present

## 2017-11-24 DIAGNOSIS — I509 Heart failure, unspecified: Secondary | ICD-10-CM

## 2017-11-24 DIAGNOSIS — D72829 Elevated white blood cell count, unspecified: Secondary | ICD-10-CM | POA: Diagnosis not present

## 2017-11-24 DIAGNOSIS — I110 Hypertensive heart disease with heart failure: Secondary | ICD-10-CM | POA: Diagnosis present

## 2017-11-24 DIAGNOSIS — Z9981 Dependence on supplemental oxygen: Secondary | ICD-10-CM

## 2017-11-24 DIAGNOSIS — J440 Chronic obstructive pulmonary disease with acute lower respiratory infection: Secondary | ICD-10-CM | POA: Diagnosis present

## 2017-11-24 DIAGNOSIS — E785 Hyperlipidemia, unspecified: Secondary | ICD-10-CM | POA: Diagnosis present

## 2017-11-24 DIAGNOSIS — J9690 Respiratory failure, unspecified, unspecified whether with hypoxia or hypercapnia: Secondary | ICD-10-CM

## 2017-11-24 DIAGNOSIS — E119 Type 2 diabetes mellitus without complications: Secondary | ICD-10-CM | POA: Diagnosis present

## 2017-11-24 DIAGNOSIS — Z7902 Long term (current) use of antithrombotics/antiplatelets: Secondary | ICD-10-CM

## 2017-11-24 DIAGNOSIS — J9621 Acute and chronic respiratory failure with hypoxia: Secondary | ICD-10-CM | POA: Diagnosis present

## 2017-11-24 HISTORY — DX: Acute ischemic heart disease, unspecified: I24.9

## 2017-11-24 LAB — BRAIN NATRIURETIC PEPTIDE: B Natriuretic Peptide: 384 pg/mL — ABNORMAL HIGH (ref 0.0–100.0)

## 2017-11-24 LAB — MRSA PCR SCREENING: MRSA BY PCR: NEGATIVE

## 2017-11-24 LAB — CBC WITH DIFFERENTIAL/PLATELET
BASOS ABS: 0.1 10*3/uL (ref 0–0.1)
Basophils Relative: 0 %
Eosinophils Absolute: 0 10*3/uL (ref 0–0.7)
Eosinophils Relative: 0 %
HEMATOCRIT: 35.8 % — AB (ref 40.0–52.0)
HEMOGLOBIN: 11.5 g/dL — AB (ref 13.0–18.0)
LYMPHS PCT: 11 %
Lymphs Abs: 2.3 10*3/uL (ref 1.0–3.6)
MCH: 29.2 pg (ref 26.0–34.0)
MCHC: 32 g/dL (ref 32.0–36.0)
MCV: 91.3 fL (ref 80.0–100.0)
Monocytes Absolute: 1.3 10*3/uL — ABNORMAL HIGH (ref 0.2–1.0)
Monocytes Relative: 6 %
NEUTROS ABS: 16.4 10*3/uL — AB (ref 1.4–6.5)
NEUTROS PCT: 83 %
Platelets: 267 10*3/uL (ref 150–440)
RBC: 3.92 MIL/uL — AB (ref 4.40–5.90)
RDW: 15.1 % — ABNORMAL HIGH (ref 11.5–14.5)
WBC: 20 10*3/uL — AB (ref 3.8–10.6)

## 2017-11-24 LAB — COMPREHENSIVE METABOLIC PANEL
ALBUMIN: 3.7 g/dL (ref 3.5–5.0)
ALT: 29 U/L (ref 17–63)
ANION GAP: 8 (ref 5–15)
AST: 39 U/L (ref 15–41)
Alkaline Phosphatase: 69 U/L (ref 38–126)
BUN: 25 mg/dL — ABNORMAL HIGH (ref 6–20)
CALCIUM: 8.3 mg/dL — AB (ref 8.9–10.3)
CHLORIDE: 103 mmol/L (ref 101–111)
CO2: 28 mmol/L (ref 22–32)
CREATININE: 0.76 mg/dL (ref 0.61–1.24)
GFR calc Af Amer: >60 mL/min (ref >60)
GFR calc non Af Amer: >60 mL/min (ref >60)
GLUCOSE: 159 mg/dL — AB (ref 65–99)
Potassium: 3.8 mmol/L (ref 3.5–5.1)
SODIUM: 139 mmol/L (ref 135–145)
Total Bilirubin: 0.6 mg/dL (ref 0.3–1.2)
Total Protein: 8.2 g/dL — ABNORMAL HIGH (ref 6.5–8.1)

## 2017-11-24 LAB — BLOOD GAS, ARTERIAL
ACID-BASE EXCESS: 5.7 mmol/L — AB (ref 0.0–2.0)
Bicarbonate: 34 mmol/L — ABNORMAL HIGH (ref 20.0–28.0)
Delivery systems: POSITIVE
Expiratory PAP: 6
FIO2: 0.4
INSPIRATORY PAP: 12
Mechanical Rate: 8
O2 SAT: 99.2 %
Patient temperature: 37
pCO2 arterial: 69 mmHg (ref 32.0–48.0)
pH, Arterial: 7.3 — ABNORMAL LOW (ref 7.350–7.450)
pO2, Arterial: 153 mmHg — ABNORMAL HIGH (ref 83.0–108.0)

## 2017-11-24 LAB — PROTIME-INR
INR: 1.03
PROTHROMBIN TIME: 13.4 s (ref 11.4–15.2)

## 2017-11-24 LAB — ECHOCARDIOGRAM COMPLETE
HEIGHTINCHES: 64 in
Weight: 3086.44 oz

## 2017-11-24 LAB — GLUCOSE, CAPILLARY
GLUCOSE-CAPILLARY: 178 mg/dL — AB (ref 65–99)
Glucose-Capillary: 149 mg/dL — ABNORMAL HIGH (ref 65–99)
Glucose-Capillary: 168 mg/dL — ABNORMAL HIGH (ref 65–99)
Glucose-Capillary: 139 mg/dL — ABNORMAL HIGH (ref 65–99)

## 2017-11-24 LAB — TROPONIN I
Troponin I: 2.09 ng/mL (ref <0.03)
Troponin I: 2.19 ng/mL (ref <0.03)
Troponin I: 1.75 ng/mL (ref <0.03)

## 2017-11-24 LAB — INFLUENZA PANEL BY PCR (TYPE A & B)
INFLBPCR: NEGATIVE
Influenza A By PCR: NEGATIVE

## 2017-11-24 LAB — HEPARIN LEVEL (UNFRACTIONATED): Heparin Unfractionated: 0.11 IU/mL — ABNORMAL LOW (ref 0.30–0.70)

## 2017-11-24 LAB — LIPASE, BLOOD: Lipase: 24 U/L (ref 11–51)

## 2017-11-24 LAB — APTT: aPTT: 26 seconds (ref 24–36)

## 2017-11-24 LAB — PROCALCITONIN: Procalcitonin: <0.1 ng/mL

## 2017-11-24 LAB — MAGNESIUM: Magnesium: 2 mg/dL (ref 1.7–2.4)

## 2017-11-24 LAB — HEMOGLOBIN A1C
Hgb A1c MFr Bld: 6 % — ABNORMAL HIGH (ref 4.8–5.6)
Mean Plasma Glucose: 125.5 mg/dL

## 2017-11-24 MED ORDER — AZITHROMYCIN 250 MG PO TABS
250.0000 mg | ORAL_TABLET | Freq: Every day | ORAL | Status: AC
  Administered 2017-11-25 – 2017-11-28 (×4): 250 mg via ORAL
  Filled 2017-11-24 (×4): qty 1

## 2017-11-24 MED ORDER — EZETIMIBE 10 MG PO TABS
10.0000 mg | ORAL_TABLET | Freq: Every day | ORAL | Status: DC
  Administered 2017-11-24 – 2017-11-29 (×6): 10 mg via ORAL
  Filled 2017-11-24 (×6): qty 1

## 2017-11-24 MED ORDER — SENNA 8.6 MG PO TABS
1.0000 | ORAL_TABLET | Freq: Every day | ORAL | Status: DC
  Administered 2017-11-24 – 2017-11-29 (×6): 8.6 mg via ORAL
  Filled 2017-11-24 (×6): qty 1

## 2017-11-24 MED ORDER — IPRATROPIUM-ALBUTEROL 0.5-2.5 (3) MG/3ML IN SOLN
3.0000 mL | Freq: Four times a day (QID) | RESPIRATORY_TRACT | Status: DC
  Administered 2017-11-24 – 2017-11-25 (×4): 3 mL via RESPIRATORY_TRACT
  Filled 2017-11-24 (×4): qty 3

## 2017-11-24 MED ORDER — METOPROLOL TARTRATE 25 MG PO TABS
25.0000 mg | ORAL_TABLET | Freq: Two times a day (BID) | ORAL | Status: DC
  Administered 2017-11-24 – 2017-11-29 (×11): 25 mg via ORAL
  Filled 2017-11-24 (×11): qty 1

## 2017-11-24 MED ORDER — IPRATROPIUM-ALBUTEROL 0.5-2.5 (3) MG/3ML IN SOLN
3.0000 mL | Freq: Once | RESPIRATORY_TRACT | Status: AC
  Administered 2017-11-24: 3 mL via RESPIRATORY_TRACT

## 2017-11-24 MED ORDER — SODIUM CHLORIDE 0.9 % IV SOLN
1.0000 g | INTRAVENOUS | Status: DC
  Administered 2017-11-24 – 2017-11-29 (×6): 1 g via INTRAVENOUS
  Filled 2017-11-24 (×6): qty 10

## 2017-11-24 MED ORDER — ONDANSETRON HCL 4 MG PO TABS
4.0000 mg | ORAL_TABLET | Freq: Four times a day (QID) | ORAL | Status: DC | PRN
  Administered 2017-11-28: 4 mg via ORAL
  Filled 2017-11-24: qty 1

## 2017-11-24 MED ORDER — IPRATROPIUM-ALBUTEROL 0.5-2.5 (3) MG/3ML IN SOLN
3.0000 mL | Freq: Once | RESPIRATORY_TRACT | Status: AC
  Administered 2017-11-24: 3 mL via RESPIRATORY_TRACT
  Filled 2017-11-24: qty 9

## 2017-11-24 MED ORDER — ATORVASTATIN CALCIUM 20 MG PO TABS
40.0000 mg | ORAL_TABLET | Freq: Every day | ORAL | Status: DC
  Administered 2017-11-25 – 2017-11-28 (×4): 40 mg via ORAL
  Filled 2017-11-24 (×4): qty 2

## 2017-11-24 MED ORDER — PANTOPRAZOLE SODIUM 40 MG PO TBEC
40.0000 mg | DELAYED_RELEASE_TABLET | Freq: Every day | ORAL | Status: DC
  Administered 2017-11-24 – 2017-11-29 (×6): 40 mg via ORAL
  Filled 2017-11-24 (×6): qty 1

## 2017-11-24 MED ORDER — ONDANSETRON HCL 4 MG/2ML IJ SOLN: 4.0000 mg | Freq: Four times a day (QID) | INTRAMUSCULAR | Status: DC | PRN

## 2017-11-24 MED ORDER — ACETAMINOPHEN 325 MG PO TABS: 650.0000 mg | ORAL_TABLET | Freq: Four times a day (QID) | ORAL | Status: DC | PRN

## 2017-11-24 MED ORDER — FUROSEMIDE 10 MG/ML IJ SOLN
40.0000 mg | Freq: Two times a day (BID) | INTRAMUSCULAR | Status: DC
Start: 2017-11-24 — End: 2017-11-29
  Administered 2017-11-24 – 2017-11-29 (×11): 40 mg via INTRAVENOUS
  Filled 2017-11-24 (×11): qty 4

## 2017-11-24 MED ORDER — MORPHINE SULFATE (PF) 2 MG/ML IV SOLN: 2.0000 mg | INTRAVENOUS | Status: DC | PRN

## 2017-11-24 MED ORDER — ASPIRIN 81 MG PO CHEW
324.0000 mg | CHEWABLE_TABLET | Freq: Once | ORAL | Status: AC
  Administered 2017-11-24: 324 mg via ORAL
  Filled 2017-11-24: qty 4

## 2017-11-24 MED ORDER — METHYLPREDNISOLONE SODIUM SUCC 40 MG IJ SOLR
40.0000 mg | Freq: Every day | INTRAMUSCULAR | Status: DC
  Administered 2017-11-24: 40 mg via INTRAVENOUS
  Filled 2017-11-24: qty 1

## 2017-11-24 MED ORDER — INSULIN ASPART 100 UNIT/ML ~~LOC~~ SOLN
0.0000 [IU] | Freq: Three times a day (TID) | SUBCUTANEOUS | Status: DC
  Administered 2017-11-24 – 2017-11-26 (×5): 2 [IU] via SUBCUTANEOUS
  Administered 2017-11-26: 1 [IU] via SUBCUTANEOUS
  Administered 2017-11-26 – 2017-11-27 (×3): 2 [IU] via SUBCUTANEOUS
  Administered 2017-11-28: 3 [IU] via SUBCUTANEOUS
  Administered 2017-11-28: 1 [IU] via SUBCUTANEOUS
  Administered 2017-11-29 (×2): 2 [IU] via SUBCUTANEOUS
  Filled 2017-11-24 (×13): qty 1

## 2017-11-24 MED ORDER — NITROGLYCERIN 0.4 MG SL SUBL: 0.4000 mg | SUBLINGUAL_TABLET | SUBLINGUAL | Status: DC | PRN

## 2017-11-24 MED ORDER — HEPARIN BOLUS VIA INFUSION
2400.0000 [IU] | Freq: Once | INTRAVENOUS | Status: AC
  Administered 2017-11-24: 2400 [IU] via INTRAVENOUS
  Filled 2017-11-24: qty 2400

## 2017-11-24 MED ORDER — BUDESONIDE 0.5 MG/2ML IN SUSP
0.5000 mg | Freq: Two times a day (BID) | RESPIRATORY_TRACT | Status: DC
  Administered 2017-11-24 – 2017-11-29 (×11): 0.5 mg via RESPIRATORY_TRACT
  Filled 2017-11-24 (×11): qty 2

## 2017-11-24 MED ORDER — ACETAMINOPHEN 650 MG RE SUPP: 650.0000 mg | Freq: Four times a day (QID) | RECTAL | Status: DC | PRN

## 2017-11-24 MED ORDER — AZITHROMYCIN 500 MG PO TABS
500.0000 mg | ORAL_TABLET | Freq: Every day | ORAL | Status: AC
  Administered 2017-11-24: 500 mg via ORAL
  Filled 2017-11-24: qty 1

## 2017-11-24 MED ORDER — ESCITALOPRAM OXALATE 10 MG PO TABS
20.0000 mg | ORAL_TABLET | Freq: Every day | ORAL | Status: DC
  Administered 2017-11-24 – 2017-11-28 (×5): 20 mg via ORAL
  Filled 2017-11-24 (×4): qty 2
  Filled 2017-11-24: qty 1

## 2017-11-24 MED ORDER — CLOPIDOGREL BISULFATE 75 MG PO TABS
75.0000 mg | ORAL_TABLET | Freq: Every day | ORAL | Status: DC
  Administered 2017-11-24 – 2017-11-29 (×6): 75 mg via ORAL
  Filled 2017-11-24 (×6): qty 1

## 2017-11-24 MED ORDER — HEPARIN (PORCINE) IN NACL 100-0.45 UNIT/ML-% IJ SOLN
1550.0000 [IU]/h | INTRAMUSCULAR | Status: DC
Start: 2017-11-24 — End: 2017-11-26
  Administered 2017-11-24: 1250 [IU]/h via INTRAVENOUS
  Administered 2017-11-24 (×2): 950 [IU]/h via INTRAVENOUS
  Administered 2017-11-25: 1550 [IU]/h via INTRAVENOUS
  Filled 2017-11-24 (×3): qty 250

## 2017-11-24 MED ORDER — HEPARIN SODIUM (PORCINE) 5000 UNIT/ML IJ SOLN
4000.0000 [IU] | Freq: Once | INTRAMUSCULAR | Status: AC
  Administered 2017-11-24: 4000 [IU] via INTRAVENOUS

## 2017-11-24 MED ORDER — ALPRAZOLAM 0.25 MG PO TABS
0.2500 mg | ORAL_TABLET | Freq: Two times a day (BID) | ORAL | Status: DC | PRN
  Administered 2017-11-27 – 2017-11-28 (×2): 0.25 mg via ORAL
  Filled 2017-11-24 (×2): qty 1

## 2017-11-24 MED ORDER — NICOTINE 14 MG/24HR TD PT24
14.0000 mg | MEDICATED_PATCH | Freq: Every day | TRANSDERMAL | Status: DC
  Administered 2017-11-24 – 2017-11-26 (×3): 14 mg via TRANSDERMAL
  Filled 2017-11-24 (×3): qty 1

## 2017-11-24 MED ORDER — ASPIRIN EC 325 MG PO TBEC
325.0000 mg | DELAYED_RELEASE_TABLET | Freq: Every day | ORAL | Status: DC
  Administered 2017-11-25 – 2017-11-29 (×5): 325 mg via ORAL
  Filled 2017-11-24 (×5): qty 1

## 2017-11-24 MED ORDER — INSULIN ASPART 100 UNIT/ML ~~LOC~~ SOLN: 0.0000 [IU] | Freq: Every day | SUBCUTANEOUS | Status: DC

## 2017-11-24 NOTE — Consult Note (Signed)
[facility] Pulmonary Medicine Consultation      Name: Kyle Rich MRN: [id number] DOB: 13-May-1956    ADMISSION DATE:  11/24/2017 CONSULTATION DATE:  11/24/2017  REFERRING MD :  Alford Highland, M.D   CHIEF COMPLAINT:    Shortness of breath   HISTORY OF PRESENT ILLNESS    Mr. Kyle Rich  is a 62 y.o. male with a known history of CAD, COPD presents with shortness of breath going on for a couple days.  The patient is currently on BiPAP in the ER 40% FiO2 and using accessory muscles to breathe.  He complains of shortness of breath, fever and chills, weight gain, coughing up yellow phlegm and wheezing.  He feels like he is going to die.  He stated he had a couple stents a few months ago but I am not seeing any record of that in the chart.  Last cardiac catheterization we saw is in 2017.  The patient does complain of chest pain but is not the best historian and cannot describe it.  States it is severe   In the center of his chest.  Complains of some abdominal pain.  No nausea or vomiting or diaphoresis.  Hospitalist services were contacted for further evaluation.  In the ER his troponin was 2.  The patient arrived in the ICU on NIPPV.  He reported that he felt better and his chest pain had resolved.    SIGNIFICANT EVENTS   3/22 admit to ICU    PAST MEDICAL HISTORY    :  Past Medical History:  Diagnosis Date  . ACS (acute coronary syndrome) (HCC)    unstable angina in 2017 leading to catheterization/PCI by Dr. Juliann Pares  . Arthritis   . Asthma   . CHF (congestive heart failure) (HCC)   . COPD (chronic obstructive pulmonary disease) (HCC)   . Diabetes mellitus without complication (HCC)   . Hepatitis C   . Hypertension    Past Surgical History:  Procedure Laterality Date  . CARDIAC CATHETERIZATION N/A 06/15/2016   Procedure: Left Heart Cath and Coronary Angiography PCI;  Surgeon: Alwyn Pea, MD;  Location: [facility] INVASIVE CV LAB;  Service: Cardiovascular;  Laterality:  N/A;   Prior to Admission medications   Medication Sig Start Date End Date Taking? Authorizing Provider  albuterol (PROAIR HFA) 108 (90 BASE) MCG/ACT inhaler Inhale 2 puffs into the lungs every 6 (six) hours as needed for wheezing.     [provider]  ALPRAZolam Prudy Feeler) 0.25 MG tablet Take 1 tablet (0.25 mg total) 2 (two) times daily as needed by mouth for anxiety. 07/23/17   Enid Baas, MD  aspirin EC 325 MG EC tablet Take 1 tablet (325 mg total) by mouth daily. 06/16/16   Houston Siren, MD  atorvastatin (LIPITOR) 40 MG tablet Take 1 tablet (40 mg total) by mouth daily at 6 PM. 06/16/16   Sainani, Rolly Pancake, MD  chlorpheniramine-HYDROcodone (TUSSIONEX) 10-8 MG/5ML SUER Take 5 mLs every 12 (twelve) hours by mouth. 07/23/17   Enid Baas, MD  clopidogrel (PLAVIX) 75 MG tablet Take 75 mg by mouth daily. 11/03/17   [provider]  [employer] SENNA 8.6 MG tablet Take 1 tablet by mouth daily. 10/31/17   [provider]  escitalopram (LEXAPRO) 20 MG tablet Take 1 tablet (20 mg total) at bedtime by mouth. 07/23/17   Enid Baas, MD  ezetimibe (ZETIA) 10 MG tablet Take 10 mg by mouth daily. 10/31/17   [provider]  Fluticasone-Salmeterol (ADVAIR  DISKUS) 500-50 MCG/DOSE AEPB Inhale 1 puff every 12 (twelve) hours into the lungs. 07/23/17 07/23/18  Enid BaasKalisetti, Radhika, MD  ipratropium-albuterol (DUONEB) 0.5-2.5 (3) MG/3ML SOLN Take 3 mLs every 6 (six) hours as needed by nebulization (wheezing). 07/23/17   Enid BaasKalisetti, Radhika, MD  metFORMIN (GLUCOPHAGE) 500 MG tablet Take by mouth 2 (two) times daily with a meal.    [provider]  metoprolol tartrate (LOPRESSOR) 25 MG tablet Take 1 tablet (25 mg total) by mouth 2 (two) times daily. 06/15/16   Houston SirenSainani, Vivek J, MD  nicotine (NICODERM CQ - DOSED IN MG/24 HOURS) 14 mg/24hr patch Place 1 patch (14 mg total) daily onto the skin. 07/24/17   Enid BaasKalisetti, Radhika, MD  nitroGLYCERIN (NITROSTAT) 0.4 MG SL  tablet Place 0.4 mg under the tongue every 5 (five) minutes x 3 doses as needed for chest pain. 10/31/17   [provider]  pantoprazole (PROTONIX) 40 MG tablet Take 1 tablet (40 mg total) daily by mouth. 07/23/17   Enid BaasKalisetti, Radhika, MD  predniSONE (DELTASONE) 10 MG tablet Take 10-40 mg by mouth daily. Take 4 tabs on days 1-3, then 3 tabs on days 4-6, then 2 tabs on days 7-9 then 1 tab on day 10 then off 11/22/17   [provider]  predniSONE (STERAPRED UNI-PAK 21 TAB) 10 MG (21) TBPK tablet 6 tabs PO x 1 day 5 tabs PO x 1 day 4 tabs PO x 1 day 3 tabs PO x 1 day 2 tabs PO x 1 day 1 tab PO x 1 day and stop Patient not taking: Reported on 11/24/2017 07/23/17   Enid BaasKalisetti, Radhika, MD  SUBOXONE 8-2 MG FILM Place 1 Film (8 mg total) under the tongue 2 (two) times daily Marcille BuffyNADEAN [id number] 06/20/17   [provider]  tiotropium (SPIRIVA HANDIHALER) 18 MCG inhalation capsule Place 1 capsule (18 mcg total) daily into inhaler and inhale. 07/23/17 07/23/18  Enid BaasKalisetti, Radhika, MD   No Known Allergies   FAMILY HISTORY   Family History  Problem Relation Age of Onset  . Bladder Cancer Mother   . Peripheral vascular disease Mother   . Lung cancer Father       SOCIAL HISTORY    reports that he has been smoking cigarettes.  He has a 40.00 pack-year smoking history. He has never used smokeless tobacco. He reports that he drinks about 0.6 oz of alcohol per week. He reports that he does not use drugs.  ROS CONSTITUTIONAL:  positive for fever, chills and sweats.  Positive for fatigue.  Positive for weight gain. EYES: No blurred or double vision.  EARS, NOSE, AND THROAT: No tinnitus or ear pain.  Positive for runny nose and sore throat RESPIRATORY: Positive for cough, shortness of breath, and wheezing.  No hemoptysis.  CARDIOVASCULAR: Positive for chest pain, orthopnea.  GASTROINTESTINAL: No nausea, vomiting, diarrhea or abdominal pain. No blood in bowel  movements GENITOURINARY: No dysuria, hematuria.  ENDOCRINE: No polyuria, nocturia,  HEMATOLOGY: No anemia, easy bruising or bleeding SKIN: No rash or lesion. MUSCULOSKELETAL: No joint pain or arthritis.   NEUROLOGIC: No tingling, numbness, weakness.  PSYCHIATRY: No anxiety or depression.      VITAL SIGNS    Temp:  [96.4 F (35.8 C)-97.5 F (36.4 C)] 97.5 F (36.4 C) (03/22 1200) Pulse Rate:  [81-106] 81 (03/22 1300) Resp:  [16-21] 16 (03/22 1300) BP: (130-159)/(71-92) 146/86 (03/22 1300) SpO2:  [95 %-99 %] 95 % (03/22 1300) FiO2 (%):  [30 %-40 %] 30 % (03/22 1100) Weight:  [  185 lb (83.9 kg)-192 lb 14.4 oz (87.5 kg)] 192 lb 14.4 oz (87.5 kg) (03/22 1136) HEMODYNAMICS:   VENTILATOR SETTINGS: FiO2 (%):  [30 %-40 %] 30 % INTAKE / OUTPUT:  Intake/Output Summary (Last 24 hours) at 11/24/2017 1313 Last data filed at 11/24/2017 1300 Gross per 24 hour  Intake 148.13 ml  Output 950 ml  Net -801.87 ml       PHYSICAL EXAM    GENERAL:  62 y.o.-year-old patient lying in the bed with acute distress.  EYES: Pupils equal, round, reactive to light and accommodation. No scleral icterus. Extraocular muscles intact.  HEENT: Head atraumatic, normocephalic. Oropharynx and nasopharynx clear.  NECK:  Supple, no jugular venous distention. No thyroid enlargement, no tenderness.  LUNGS:  decreased breath sounds bilaterally, on BIPAP expiratory wheezing, positive rales at the bases.  positive use of accessory muscles of respiration.  CARDIOVASCULAR: S1, S2 normal. No murmurs, rubs, or gallops.  ABDOMEN: Obese, Soft, nontender, nondistended. Bowel sounds present. No organomegaly or mass.  EXTREMITIES: Trace edema, no cyanosis, or clubbing.  NEUROLOGIC: Cranial nerves II through XII are intact. Muscle strength 5/5 in all extremities. Sensation intact. Gait not checked.  PSYCHIATRIC: The patient is alert and oriented x 3.  SKIN: No rash, lesion, or ulcer.       LABS    LABS:  CBC Recent Labs  Lab 11/24/17 0621  WBC 20.0*  HGB 11.5*  HCT 35.8*  PLT 267   Coag's Recent Labs  Lab 11/24/17 0749  APTT 26  INR 1.03   BMET Recent Labs  Lab 11/24/17 0621  NA 139  K 3.8  CL 103  CO2 28  BUN 25*  CREATININE 0.76  GLUCOSE 159*   Electrolytes Recent Labs  Lab 11/24/17 0621  CALCIUM 8.3*  MG 2.0   Sepsis Markers Recent Labs  Lab 11/24/17 0749  PROCALCITON <0.10   ABG Recent Labs  Lab 11/24/17 0621  PHART 7.30*  PCO2ART 69*  PO2ART 153*   Liver Enzymes Recent Labs  Lab 11/24/17 0621  AST 39  ALT 29  ALKPHOS 69  BILITOT 0.6  ALBUMIN 3.7   Cardiac Enzymes Recent Labs  Lab 11/24/17 0621 11/24/17 0749  TROPONINI 2.09* 2.19*   Glucose Recent Labs  Lab 11/24/17 0925 11/24/17 1128  GLUCAP 149* 178*     No results found for this or any previous visit (from the past 240 hour(s)).   Current Facility-Administered Medications:  .  acetaminophen (TYLENOL) tablet 650 mg, 650 mg, Oral, Q6H PRN **OR** acetaminophen (TYLENOL) suppository 650 mg, 650 mg, Rectal, Q6H PRN, Renae Gloss, Richard, MD .  ALPRAZolam Prudy Feeler) tablet 0.25 mg, 0.25 mg, Oral, BID PRN, Alford Highland, MD .  Melene Muller ON 11/25/2017] aspirin EC tablet 325 mg, 325 mg, Oral, Daily, Wieting, Richard, MD .  atorvastatin (LIPITOR) tablet 40 mg, 40 mg, Oral, q1800, Wieting, Richard, MD .  [address] azithromycin [facility]) tablet 500 mg, 500 mg, Oral, Daily, 500 mg at 11/24/17 1147 **FOLLOWED BY** [START ON 11/25/2017] azithromycin (ZITHROMAX) tablet 250 mg, 250 mg, Oral, Daily, Wieting, Richard, MD .  budesonide (PULMICORT) nebulizer solution 0.5 mg, 0.5 mg, Nebulization, BID, Renae Gloss, Richard, MD, 0.5 mg at 11/24/17 0923 .  cefTRIAXone (ROCEPHIN) 1 g in sodium chloride 0.9 % 100 mL IVPB, 1 g, Intravenous, Q24H, Alford Highland, MD, Stopped at 11/24/17 1205 .  clopidogrel (PLAVIX) tablet 75 mg, 75 mg, Oral, Daily, Renae Gloss, Richard, MD, 75 mg at 11/24/17 1145 .   escitalopram (LEXAPRO) tablet 20 mg, 20 mg, Oral,  QHS, Wieting, Richard, MD .  ezetimibe (ZETIA) tablet 10 mg, 10 mg, Oral, Daily, Wieting, Richard, MD, 10 mg at 11/24/17 1146 .  furosemide (LASIX) injection 40 mg, 40 mg, Intravenous, BID, Wieting, Richard, MD, 40 mg at 11/24/17 1123 .  heparin ADULT infusion 100 units/mL (25000 units/22mL sodium chloride 0.45%), 950 Units/hr, Intravenous, Continuous, Wieting, Richard, MD, Last Rate: 9.5 mL/hr at 11/24/17 0757, 950 Units/hr at 11/24/17 0757 .  insulin aspart (novoLOG) injection 0-5 Units, 0-5 Units, Subcutaneous, QHS, Wieting, Richard, MD .  insulin aspart (novoLOG) injection 0-9 Units, 0-9 Units, Subcutaneous, TID WC, Alford Highland, MD, 2 Units at 11/24/17 1134 .  ipratropium-albuterol (DUONEB) 0.5-2.5 (3) MG/3ML nebulizer solution 3 mL, 3 mL, Nebulization, Q6H, Wieting, Richard, MD, 3 mL at 11/24/17 1312 .  methylPREDNISolone sodium succinate (SOLU-MEDROL) 40 mg/mL injection 40 mg, 40 mg, Intravenous, Daily, Renae Gloss, Richard, MD, 40 mg at 11/24/17 1123 .  metoprolol tartrate (LOPRESSOR) tablet 25 mg, 25 mg, Oral, BID, Renae Gloss, Richard, MD, 25 mg at 11/24/17 1145 .  morphine 2 MG/ML injection 2 mg, 2 mg, Intravenous, Q3H PRN, Renae Gloss, Richard, MD .  nicotine (NICODERM CQ - dosed in mg/24 hours) patch 14 mg, 14 mg, Transdermal, Daily, Wieting, Richard, MD, 14 mg at 11/24/17 1147 .  nitroGLYCERIN (NITROSTAT) SL tablet 0.4 mg, 0.4 mg, Sublingual, Q5 Min x 3 PRN, Wieting, Richard, MD .  ondansetron (ZOFRAN) tablet 4 mg, 4 mg, Oral, Q6H PRN **OR** ondansetron (ZOFRAN) injection 4 mg, 4 mg, Intravenous, Q6H PRN, Wieting, Richard, MD .  pantoprazole (PROTONIX) EC tablet 40 mg, 40 mg, Oral, Daily, Renae Gloss, Richard, MD, 40 mg at 11/24/17 1145 .  senna (SENOKOT) tablet 8.6 mg, 1 tablet, Oral, Daily, Renae Gloss, Richard, MD, 8.6 mg at 11/24/17 1146  IMAGING    Dg Chest Portable 1 View  Result Date: 11/24/2017 CLINICAL DATA:  Respiratory distress. Severe  respiratory difficulty. History of COPD and CHF. EXAM: PORTABLE CHEST 1 VIEW COMPARISON:  07/19/2017 FINDINGS: Lungs are hyperinflated. Unchanged heart size and mediastinal contours. Bronchial and interstitial thickening with minimal Kerley B-lines suspicious for pulmonary edema. No confluent airspace disease. No pleural effusion or pneumothorax. No acute osseous abnormalities are seen. IMPRESSION: Bronchial and interstitial thickening suspicious for pulmonary edema. Chronic hyperinflation. Electronically Signed   By: Rubye Oaks M.D.   On: 11/24/2017 06:54                         MAJOR EVENTS/TEST RESULTS:   INDWELLING DEVICES::Peripheral IV  MICRO DATA: MRSA PCR  Urine  Blood Resp   ANTIMICROBIALS:   Nil  ASSESSMENT/PLAN   1. Acute on chronic hypoxic and hypercarbic Respiratory Failure requiring NIPPV 2. Acute NSTEMI with acute pulmonary oedema 3. Acute exacerbation of COPD with probable acute bronchitis 4. Hx of Type 2 diabetes mellitus 5. Hx of hyperlipidemia 6. Active ongoing nicotine dependency  Plan: 1. Continue on NIPPV and liberate as per symptoms 2. Continue on Heparin, beta blocker, ASA, nitrates.  Cardiology consulted; ECHO ordered 3. Continue on empirical ABX, bronchodilators, steroids 4. Target glucose management 5. Anti-lipid therapy 6. Smoking cessation counseling     I have personally obtained a history, examined the patient, evaluated laboratory and independently reviewed  imaging results, formulated the assessment and plan and placed orders.  The Patient requires high complexity decision making for assessment and support, frequent evaluation and titration of therapies, application of advanced monitoring technologies and extensive interpretation of multiple databases. Critical Care Time devoted to patient care  services described in this note is 40 minutes.   Overall, patient is critically ill, prognosis is guarded. Patient at high risk for  cardiac arrest and death.   [name], M.D

## 2017-11-24 NOTE — H&P (Signed)
[facility] [place] [facility]   PATIENT NAME: Kyle Rich    MR#:  [id number]  DATE OF BIRTH:  08/09/1956  DATE OF ADMISSION:  11/24/2017  PRIMARY CARE PHYSICIAN: [name], Sionne A, MD   REQUESTING/REFERRING PHYSICIAN: Dr Loleta Roseory Forbach  CHIEF COMPLAINT:   Chief Complaint  Patient presents with  . Respiratory Distress    HISTORY OF PRESENT ILLNESS:  Kyle Rich  is a 62 y.o. male with a known history of CAD, COPD presents with shortness of breath going on for a couple days.  The patient is currently on BiPAP in the ER 40% FiO2 and using accessory muscles to breathe.  He complains of shortness of breath, fever and chills, weight gain, coughing up yellow phlegm and wheezing.  He feels like he is going to die.  He stated he had a couple stents a few months ago but I am not seeing any record of that in the chart.  Last cardiac catheterization we saw is in 2017.  The patient does complain of chest pain but is not the best historian and cannot describe it.  States it is severe   In the center of his chest.  Complains of some abdominal pain.  No nausea or vomiting or diaphoresis.  Hospitalist services were contacted for further evaluation.  In the ER his troponin was 2.  PAST MEDICAL HISTORY:   Past Medical History:  Diagnosis Date  . ACS (acute coronary syndrome) (HCC)    unstable angina in 2017 leading to catheterization/PCI by Dr. Juliann Paresallwood  . Arthritis   . Asthma   . CHF (congestive heart failure) (HCC)   . COPD (chronic obstructive pulmonary disease) (HCC)   . Diabetes mellitus without complication (HCC)   . Hepatitis C   . Hypertension     PAST SURGICAL HISTORY:   Past Surgical History:  Procedure Laterality Date  . CARDIAC CATHETERIZATION N/A 06/15/2016   Procedure: Left Heart Cath and Coronary Angiography PCI;  Surgeon: Alwyn Peawayne D Callwood, MD;  Location: [facility] INVASIVE CV LAB;  Service: Cardiovascular;  Laterality: N/A;    SOCIAL HISTORY:    Social History   Tobacco Use  . Smoking status: Current Every Day Smoker    Packs/day: 1.00    Years: 40.00    Pack years: 40.00    Types: Cigarettes  . Smokeless tobacco: Never Used  . Tobacco comment: he is interested but doesnt want to quit due to him mother passing away  Substance Use Topics  . Alcohol use: Yes    Alcohol/week: 0.6 oz    Types: 1 Cans of beer per week    FAMILY HISTORY:   Family History  Problem Relation Age of Onset  . Bladder Cancer Mother   . Peripheral vascular disease Mother   . Lung cancer Father     DRUG ALLERGIES:  No Known Allergies  REVIEW OF SYSTEMS:  CONSTITUTIONAL:  positive for fever, chills and sweats.  Positive for fatigue.  Positive for weight gain. EYES: No blurred or double vision.  EARS, NOSE, AND THROAT: No tinnitus or ear pain.  Positive for runny nose and sore throat RESPIRATORY: Positive for cough, shortness of breath, and wheezing.  No hemoptysis.  CARDIOVASCULAR: Positive for chest pain, orthopnea.  GASTROINTESTINAL: No nausea, vomiting, diarrhea or abdominal pain. No blood in bowel movements GENITOURINARY: No dysuria, hematuria.  ENDOCRINE: No polyuria, nocturia,  HEMATOLOGY: No anemia, easy bruising or bleeding SKIN: No rash or lesion. MUSCULOSKELETAL: No joint pain or arthritis.  NEUROLOGIC: No tingling, numbness, weakness.  PSYCHIATRY: No anxiety or depression.   MEDICATIONS AT HOME:   Prior to Admission medications   Medication Sig Start Date End Date Taking? Authorizing Provider  albuterol (PROAIR HFA) 108 (90 BASE) MCG/ACT inhaler Inhale 2 puffs into the lungs every 6 (six) hours as needed for wheezing.     [provider]  ALPRAZolam Prudy Feeler) 0.25 MG tablet Take 1 tablet (0.25 mg total) 2 (two) times daily as needed by mouth for anxiety. 07/23/17   Enid Baas, MD  aspirin EC 325 MG EC tablet Take 1 tablet (325 mg total) by mouth daily. 06/16/16   Houston Siren, MD  atorvastatin (LIPITOR)  40 MG tablet Take 1 tablet (40 mg total) by mouth daily at 6 PM. 06/16/16   Sainani, Rolly Pancake, MD  chlorpheniramine-HYDROcodone (TUSSIONEX) 10-8 MG/5ML SUER Take 5 mLs every 12 (twelve) hours by mouth. 07/23/17   Enid Baas, MD  clopidogrel (PLAVIX) 75 MG tablet Take 75 mg by mouth daily. 11/03/17   [provider]  [facility] SENNA 8.6 MG tablet Take 1 tablet by mouth daily. 10/31/17   [provider]  escitalopram (LEXAPRO) 20 MG tablet Take 1 tablet (20 mg total) at bedtime by mouth. 07/23/17   Enid Baas, MD  ezetimibe (ZETIA) 10 MG tablet Take 10 mg by mouth daily. 10/31/17   [provider]  Fluticasone-Salmeterol (ADVAIR DISKUS) 500-50 MCG/DOSE AEPB Inhale 1 puff every 12 (twelve) hours into the lungs. 07/23/17 07/23/18  Enid Baas, MD  ipratropium-albuterol (DUONEB) 0.5-2.5 (3) MG/3ML SOLN Take 3 mLs every 6 (six) hours as needed by nebulization (wheezing). 07/23/17   Enid Baas, MD  metFORMIN (GLUCOPHAGE) 500 MG tablet Take by mouth 2 (two) times daily with a meal.    [provider]  metoprolol tartrate (LOPRESSOR) 25 MG tablet Take 1 tablet (25 mg total) by mouth 2 (two) times daily. 06/15/16   Houston Siren, MD  nicotine (NICODERM CQ - DOSED IN MG/24 HOURS) 14 mg/24hr patch Place 1 patch (14 mg total) daily onto the skin. 07/24/17   Enid Baas, MD  nitroGLYCERIN (NITROSTAT) 0.4 MG SL tablet Place 0.4 mg under the tongue every 5 (five) minutes x 3 doses as needed for chest pain. 10/31/17   [provider]  pantoprazole (PROTONIX) 40 MG tablet Take 1 tablet (40 mg total) daily by mouth. 07/23/17   Enid Baas, MD  predniSONE (DELTASONE) 10 MG tablet Take 10-40 mg by mouth daily. Take 4 tabs on days 1-3, then 3 tabs on days 4-6, then 2 tabs on days 7-9 then 1 tab on day 10 then off 11/22/17   [provider]  predniSONE (STERAPRED UNI-PAK 21 TAB) 10 MG (21) TBPK tablet 6 tabs PO x 1 day 5 tabs PO x 1  day 4 tabs PO x 1 day 3 tabs PO x 1 day 2 tabs PO x 1 day 1 tab PO x 1 day and stop Patient not taking: Reported on 11/24/2017 07/23/17   Enid Baas, MD  SUBOXONE 8-2 MG FILM Place 1 Film (8 mg total) under the tongue 2 (two) times daily Marcille Buffy [id number] 06/20/17   [provider]  tiotropium (SPIRIVA HANDIHALER) 18 MCG inhalation capsule Place 1 capsule (18 mcg total) daily into inhaler and inhale. 07/23/17 07/23/18  Enid Baas, MD      VITAL SIGNS:  Blood pressure (!) 145/75, pulse 95, temperature (!) 96.4 F (35.8 C), temperature source Axillary, resp. rate 16, height 5\' 6"  (1.676  m), weight 83.9 kg (185 lb), SpO2 99 %.  PHYSICAL EXAMINATION:  GENERAL:  62 y.o.-year-old patient lying in the bed with acute distress.  EYES: Pupils equal, round, reactive to light and accommodation. No scleral icterus. Extraocular muscles intact.  HEENT: Head atraumatic, normocephalic. Oropharynx and nasopharynx clear.  NECK:  Supple, no jugular venous distention. No thyroid enlargement, no tenderness.  LUNGS:  decreased breath sounds bilaterally, expiratory wheezing, positive rales at the bases.  positive use of accessory muscles of respiration.  CARDIOVASCULAR: S1, S2 normal. No murmurs, rubs, or gallops.  ABDOMEN: Soft, nontender, nondistended. Bowel sounds present. No organomegaly or mass.  EXTREMITIES: Trace edema, no cyanosis, or clubbing.  NEUROLOGIC: Cranial nerves II through XII are intact. Muscle strength 5/5 in all extremities. Sensation intact. Gait not checked.  PSYCHIATRIC: The patient is alert and oriented x 3.  SKIN: No rash, lesion, or ulcer.   LABORATORY PANEL:   CBC Recent Labs  Lab 11/24/17 0621  WBC 20.0*  HGB 11.5*  HCT 35.8*  PLT 267   ------------------------------------------------------------------------------------------------------------------  Chemistries  Recent Labs  Lab 11/24/17 0621  NA 139  K 3.8  CL 103  CO2 28  GLUCOSE 159*   BUN 25*  CREATININE 0.76  CALCIUM 8.3*  MG 2.0  AST 39  ALT 29  ALKPHOS 69  BILITOT 0.6   ------------------------------------------------------------------------------------------------------------------  Cardiac Enzymes Recent Labs  Lab 11/24/17 0621  TROPONINI 2.09*   ------------------------------------------------------------------------------------------------------------------  RADIOLOGY:  Dg Chest Portable 1 View  Result Date: 11/24/2017 CLINICAL DATA:  Respiratory distress. Severe respiratory difficulty. History of COPD and CHF. EXAM: PORTABLE CHEST 1 VIEW COMPARISON:  07/19/2017 FINDINGS: Lungs are hyperinflated. Unchanged heart size and mediastinal contours. Bronchial and interstitial thickening with minimal Kerley B-lines suspicious for pulmonary edema. No confluent airspace disease. No pleural effusion or pneumothorax. No acute osseous abnormalities are seen. IMPRESSION: Bronchial and interstitial thickening suspicious for pulmonary edema. Chronic hyperinflation. Electronically Signed   By: Rubye Oaks M.D.   On: 11/24/2017 06:54    EKG:   Sinus rhythm 97 bpm nonspecific ST-T wave changes  IMPRESSION AND PLAN:   1.  Acute hypoxic respiratory failure requiring BiPAP on presentation 40% FiO2.  Admit to the stepdown unit.  Continue to monitor pulse ox.  Case discussed with critical care specialist  2.  Elevated troponin concerning for NSTEMI.  Patient started on heparin drip.  Patient already on aspirin Plavix metoprolol and statin.  Case discussed with Dr [facility] cardiology. 3.  Acute on chronic systolic congestive heart failure.  Start Lasix 40 mg IV twice daily.  Continue to monitor fluid status closely. 4.  COPD exacerbation.  Start Solu-Medrol, DuoNeb nebulizer solution and budesonide. 5.  Type 2 diabetes mellitus put on sliding scale hold Glucophage 6.  Essential hypertension continue usual medications 7.  Hyperlipidemia unspecified continue statin and  Zetia 8.  History of hepatitis C 9.  Tobacco abuse smoking cessation counseling done 4 minutes by me  I have personally reviewed the EKG, chest x-ray and radiological data Management plans discussed with the patient, and he is in agreement.  CODE STATUS:  full code  TOTAL TIME TAKING CARE OF THIS PATIENT: 50 minutes.  Patient will be admitted to the CCU stepdown.  Patient is currently critically ill.   Alford Highland M.D on 11/24/2017 at 8:12 AM  Between 7am to 6pm - Pager - [phone]  After 6pm call admission pager [phone]  [facility]  [phone]  CC: Primary care physician; Rayetta Humphrey, MD

## 2017-11-24 NOTE — ED Triage Notes (Addendum)
Pt bib [facility] from home d/t respiratory distress. Pt is a smoker-last cigarette approx 2 hours prior to calling 911. hx COPD and CHF, DM, HTN. Reports cough/congestion sx x2weeks. Pt wears chronic 2LO2 at home and FD saturation 60s. 86% on 2L with EMS.   Patient placed on cpap, received 40 lasix, 125 solumedrol through #20 right hand. 1albuterol, 1 duoneb in route. VSS in route.

## 2017-11-24 NOTE — Progress Notes (Signed)
Patient taken off BiPAP and placed on nasal cannula and started complaining of shortness of breath with audible wheezes.  RN placing patient back on BiPAP and called Brantley, RRT to come give patient a breathing treatment.

## 2017-11-24 NOTE — Progress Notes (Signed)
Resting comfortably on BiPAP, arouses to voice and A&Ox4.  When tried off BiPAP patient became short of breath.  NSR per cardiac monitor, denies chest pain and continued on heparin drip.  No visitors during shift.

## 2017-11-24 NOTE — ED Notes (Signed)
Test: Trop Critical Value: 2.09  Name of Provider Notified: York CeriseForbach

## 2017-11-24 NOTE — Telephone Encounter (Signed)
received call from [facility] that Kyle Rich wanted to let Kyle Rich know that Kyle Rich would not be in class today as [facility].

## 2017-11-24 NOTE — ED Provider Notes (Addendum)
[facility] Emergency Department Provider Note  ____________________________________________   None    (approximate)  I have reviewed the triage vital signs and the nursing notes.   HISTORY  Chief Complaint Respiratory Distress  Level 5 caveat:  history/ROS limited by acute/critical illness  HPI Kyle Rich is a 62 y.o. male with extensive chronic medical history that includes COPD on 2 L of oxygen chronically, CHF, and ACS with PCI in 2017 and couple of stents.  He presents by EMS in severe respiratory distress.  He is able to give only limited history due to his respiratory distress and being on BiPAP, but according to paramedics he has been having some nasal congestion and cough for a couple of weeks.  The patient seems to be indicating that for the last couple of days he has been feeling increasingly short of breath.  However his symptoms became acutely worse tonight just prior to calling EMS.  He reportedly had a smoke and then his breathing got severely worse.  He was on his chronic 2 L of oxygen when the first responders arrived but was satting in the 60s.  He was very anxious and hyperventilating.  His oxygenation was still in the low 80s although he was calming down and starting to improve when the paramedics arrived and put him on CPAP.  He had an SPO2 of 100% upon arrival but is still having severe respiratory distress and significant increased work of breathing.  He denies chest pain, fever, abdominal pain, and nausea.  Nothing is making his symptoms better or worse and he indicates he does not feel better than he did previously.  EMS provided Solu-Medrol 125 mg, 2 albuterol nebulizer treatments, and Lasix 40 mg IV, all prior to arrival.   Past Medical History:  Diagnosis Date  . ACS (acute coronary syndrome) (HCC)    unstable angina in 2017 leading to catheterization/PCI by Dr. Juliann Pares  . Arthritis   . Asthma   . CHF (congestive heart failure)  (HCC)   . COPD (chronic obstructive pulmonary disease) (HCC)   . Diabetes mellitus without complication (HCC)   . Hepatitis C   . Hypertension     Patient Active Problem List   Diagnosis Date Noted  . COPD with acute exacerbation (HCC) 07/19/2017  . NSTEMI (non-ST elevated myocardial infarction) (HCC) 06/15/2016  . COPD exacerbation (HCC) 06/09/2015    Past Surgical History:  Procedure Laterality Date  . CARDIAC CATHETERIZATION N/A 06/15/2016   Procedure: Left Heart Cath and Coronary Angiography PCI;  Surgeon: Alwyn Pea, MD;  Location: [facility] INVASIVE CV LAB;  Service: Cardiovascular;  Laterality: N/A;    Prior to Admission medications   Medication Sig Start Date End Date Taking? Authorizing Provider  albuterol (PROAIR HFA) 108 (90 BASE) MCG/ACT inhaler Inhale 2 puffs into the lungs every 6 (six) hours as needed for wheezing.     [provider]  ALPRAZolam Prudy Feeler) 0.25 MG tablet Take 1 tablet (0.25 mg total) 2 (two) times daily as needed by mouth for anxiety. 07/23/17   Enid Baas, MD  aspirin EC 325 MG EC tablet Take 1 tablet (325 mg total) by mouth daily. 06/16/16   Houston Siren, MD  atorvastatin (LIPITOR) 40 MG tablet Take 1 tablet (40 mg total) by mouth daily at 6 PM. 06/16/16   Sainani, Rolly Pancake, MD  chlorpheniramine-HYDROcodone (TUSSIONEX) 10-8 MG/5ML SUER Take 5 mLs every 12 (twelve) hours by mouth. 07/23/17   Enid Baas, MD  clopidogrel (  PLAVIX) 75 MG tablet Take 75 mg by mouth daily. 11/03/17   [provider]  [employer] SENNA 8.6 MG tablet Take 1 tablet by mouth daily. 10/31/17   [provider]  escitalopram (LEXAPRO) 20 MG tablet Take 1 tablet (20 mg total) at bedtime by mouth. 07/23/17   Enid BaasKalisetti, Radhika, MD  ezetimibe (ZETIA) 10 MG tablet Take 10 mg by mouth daily. 10/31/17   [provider]  Fluticasone-Salmeterol (ADVAIR DISKUS) 500-50 MCG/DOSE AEPB Inhale 1 puff every 12 (twelve) hours into the lungs. 07/23/17  07/23/18  Enid BaasKalisetti, Radhika, MD  ipratropium-albuterol (DUONEB) 0.5-2.5 (3) MG/3ML SOLN Take 3 mLs every 6 (six) hours as needed by nebulization (wheezing). 07/23/17   Enid BaasKalisetti, Radhika, MD  metFORMIN (GLUCOPHAGE) 500 MG tablet Take by mouth 2 (two) times daily with a meal.    [provider]  metoprolol tartrate (LOPRESSOR) 25 MG tablet Take 1 tablet (25 mg total) by mouth 2 (two) times daily. 06/15/16   Houston SirenSainani, Vivek J, MD  nicotine (NICODERM CQ - DOSED IN MG/24 HOURS) 14 mg/24hr patch Place 1 patch (14 mg total) daily onto the skin. 07/24/17   Enid BaasKalisetti, Radhika, MD  nitroGLYCERIN (NITROSTAT) 0.4 MG SL tablet Place 0.4 mg under the tongue every 5 (five) minutes x 3 doses as needed for chest pain. 10/31/17   [provider]  pantoprazole (PROTONIX) 40 MG tablet Take 1 tablet (40 mg total) daily by mouth. 07/23/17   Enid BaasKalisetti, Radhika, MD  predniSONE (DELTASONE) 10 MG tablet Take 10-40 mg by mouth daily. Take 4 tabs on days 1-3, then 3 tabs on days 4-6, then 2 tabs on days 7-9 then 1 tab on day 10 then off 11/22/17   [provider]  predniSONE (STERAPRED UNI-PAK 21 TAB) 10 MG (21) TBPK tablet 6 tabs PO x 1 day 5 tabs PO x 1 day 4 tabs PO x 1 day 3 tabs PO x 1 day 2 tabs PO x 1 day 1 tab PO x 1 day and stop Patient not taking: Reported on 11/24/2017 07/23/17   Enid BaasKalisetti, Radhika, MD  SUBOXONE 8-2 MG FILM Place 1 Film (8 mg total) under the tongue 2 (two) times daily Marcille BuffyNADEAN [id number] 06/20/17   [provider]  tiotropium (SPIRIVA HANDIHALER) 18 MCG inhalation capsule Place 1 capsule (18 mcg total) daily into inhaler and inhale. 07/23/17 07/23/18  Enid BaasKalisetti, Radhika, MD    Allergies Patient has no known allergies.  Family History  Problem Relation Age of Onset  . Bladder Cancer Mother   . Peripheral vascular disease Mother   . Lung cancer Father     Social History Social History   Tobacco Use  . Smoking status: Current Every Day Smoker     Packs/day: 1.00    Years: 40.00    Pack years: 40.00    Types: Cigarettes  . Smokeless tobacco: Never Used  . Tobacco comment: he is interested but doesnt want to quit due to him mother passing away  Substance Use Topics  . Alcohol use: Yes    Alcohol/week: 0.6 oz    Types: 1 Cans of beer per week  . Drug use: No    Review of Systems Level 5 caveat:  history/ROS limited by acute/critical illness ____________________________________________   PHYSICAL EXAM:  ED Triage Vitals  Enc Vitals Group     BP 11/24/17 0614 (!) 159/92     Pulse Rate 11/24/17 0614 (!) 102     Resp 11/24/17 0623 19     Temp 11/24/17  1610 (!) 96.4 F (35.8 C)     Temp Source 11/24/17 0626 Axillary     SpO2 11/24/17 0614 99 %     Weight 11/24/17 0627 83.9 kg (185 lb)     Height 11/24/17 0627 1.676 m (5\' 6" )     Head Circumference --      Peak Flow --      Pain Score 11/24/17 0627 0     Pain Loc --      Pain Edu? --      Excl. in GC? --      Constitutional: Alert and oriented.  Severe respiratory distress Eyes: Conjunctivae are normal.  Head: Atraumatic. Neck: No stridor.  No meningeal signs.   Cardiovascular: Borderline tachycardia, regular rhythm. Good peripheral circulation. Grossly normal heart sounds. Respiratory: Severe respiratory distress with increased effort, intercostal retractions and accessory muscle usage.  Severe expiratory wheezing throughout with tight breath sounds and decreased air movement. Gastrointestinal: Soft and nontender. No distention.  Musculoskeletal: No lower extremity edema. No gross deformities of extremities. Neurologic:  Normal speech and language. No gross focal neurologic deficits are appreciated.  Skin:  Skin is warm, dry and intact. No rash noted. Psychiatric: The patient is anxious but acting appropriate under the circumstances.  ____________________________________________   LABS (all labs ordered are listed, but only abnormal results are  displayed)  Labs Reviewed  CBC WITH DIFFERENTIAL/PLATELET - Abnormal; Notable for the following components:      Result Value   WBC 20.0 (*)    RBC 3.92 (*)    Hemoglobin 11.5 (*)    HCT 35.8 (*)    RDW 15.1 (*)    Neutro Abs 16.4 (*)    Monocytes Absolute 1.3 (*)    All other components within normal limits  TROPONIN I - Abnormal; Notable for the following components:   Troponin I 2.09 (*)    All other components within normal limits  COMPREHENSIVE METABOLIC PANEL - Abnormal; Notable for the following components:   Glucose, Bld 159 (*)    BUN 25 (*)    Calcium 8.3 (*)    Total Protein 8.2 (*)    All other components within normal limits  BLOOD GAS, ARTERIAL - Abnormal; Notable for the following components:   pH, Arterial 7.30 (*)    pCO2 arterial 69 (*)    pO2, Arterial 153 (*)    Bicarbonate 34.0 (*)    Acid-Base Excess 5.7 (*)    All other components within normal limits  BRAIN NATRIURETIC PEPTIDE - Abnormal; Notable for the following components:   B Natriuretic Peptide 384.0 (*)    All other components within normal limits  MAGNESIUM  LIPASE, BLOOD  PROTIME-INR  APTT   ____________________________________________  EKG  ED ECG REPORT I, Loleta Rose, the attending physician, personally viewed and interpreted this ECG.  Date: 11/24/2017 EKG Time: 6:15 AM Rate: 97 Rhythm: normal sinus rhythm QRS Axis: normal Intervals: normal ST/T Wave abnormalities: Non-specific ST segment / T-wave changes, but no evidence of acute ischemia.  Specifically, the patient has ST depression most notable in leads V5 and V6.  However when compared to old EKGs, this depression has been present in the past and in fact was more extensive in prior EKGs Narrative Interpretation: Possible acute ischemia but does not meet STEMI criteria   ____________________________________________  RADIOLOGY I, Loleta Rose, personally viewed and evaluated these images (plain radiographs) as part of my  medical decision making, as well as reviewing the written report by the  radiologist.  ED MD interpretation:  Questionable pulmonary edema  Official radiology report(s): Dg Chest Portable 1 View  Result Date: 11/24/2017 CLINICAL DATA:  Respiratory distress. Severe respiratory difficulty. History of COPD and CHF. EXAM: PORTABLE CHEST 1 VIEW COMPARISON:  07/19/2017 FINDINGS: Lungs are hyperinflated. Unchanged heart size and mediastinal contours. Bronchial and interstitial thickening with minimal Kerley B-lines suspicious for pulmonary edema. No confluent airspace disease. No pleural effusion or pneumothorax. No acute osseous abnormalities are seen. IMPRESSION: Bronchial and interstitial thickening suspicious for pulmonary edema. Chronic hyperinflation. Electronically Signed   By: Rubye Oaks M.D.   On: 11/24/2017 06:54    ____________________________________________   PROCEDURES  Critical Care performed: Yes, see critical care procedure note(s)   Procedure(s) performed:   .Critical Care Performed by: Loleta Rose, MD Authorized by: Loleta Rose, MD   Critical care provider statement:    Critical care time (minutes):  30   Critical care time was exclusive of:  Separately billable procedures and treating other patients   Critical care was necessary to treat or prevent imminent or life-threatening deterioration of the following conditions:  Respiratory failure   Critical care was time spent personally by me on the following activities:  Development of treatment plan with patient or surrogate, discussions with consultants, evaluation of patient's response to treatment, examination of patient, obtaining history from patient or surrogate, ordering and performing treatments and interventions, ordering and review of laboratory studies, ordering and review of radiographic studies, pulse oximetry, re-evaluation of patient's condition and review of old  charts     ____________________________________________   INITIAL IMPRESSION / ASSESSMENT AND PLAN / ED COURSE  As part of my medical decision making, I reviewed the following data within the electronic MEDICAL RECORD NUMBER Nursing notes reviewed and incorporated, Labs reviewed , EKG interpreted , Old EKG reviewed, Old chart reviewed, Radiograph reviewed , Discussed with admitting physician  and Notes from prior ED visits    Differential diagnosis includes, but is not limited to, COPD exacerbation, CHF exacerbation including the possibility of flash pulmonary edema, ACS, PE, pneumothorax.  Based on the patient's physical exam and history, I think he is suffering from a severe COPD exacerbation.  I find CHF to be less likely given the lack of edema and lack of coarse breath sounds but still possible.  He is reporting no chest pain and his EKG is stable from prior EKG so I think ACS is less likely.  There is no indication of or reason to assume he is having a pulmonary embolism and he has breath sounds on both sides making pneumothorax less likely.  I am aggressively treating a presumed COPD exacerbation.  He has already received Solu-Medrol and I will give 3 duo nebs and lying on BiPAP.  He was reportedly profoundly hypoxemic upon arrival in the 60s-80s and he will likely need to stay on BiPAP for an extended period of time.  No fluids at least until we can determine whether he has any evidence of pulmonary edema  Clinical Course as of Nov 24 724  Fri Nov 24, 2017  0651 ABG is notable for PCO2 of 69, so we will continue the BiPAP.  His PO2 fortunately is above 100 which indicates good oxygenation.  His CMP is unremarkable, but of great concern is his troponin of 2.09.  I am concerned this represents the possibility he could have had an MI previously which is led to his respiratory decline over the last couple of days.  His CBC is most  notable for a white blood cell count of 20.  He continues to report  no ongoing chest pain but I am ordering heparin bolus and infusion and will discuss the case with the hospitalist.   [CF]  0701 I discussed the case in person with Dr. Sheryle Hail.   [CF]  0725 Questionable pulmonary edema on CXR, but patient has already gotten Lasix 40 mg IV, will not give any additional medication at this time because I do not want to decrease his preload if he is having an MI   [CF]    Clinical Course User Index [CF] Loleta Rose, MD    ____________________________________________  FINAL CLINICAL IMPRESSION(S) / ED DIAGNOSES  Final diagnoses:  Acute respiratory failure with hypoxia and hypercapnia (HCC)  NSTEMI (non-ST elevated myocardial infarction) (HCC)  COPD exacerbation (HCC)     MEDICATIONS GIVEN DURING THIS VISIT:  Medications  heparin injection 4,000 Units (has no administration in time range)  heparin ADULT infusion 100 units/mL (25000 units/269mL sodium chloride 0.45%) (has no administration in time range)  ipratropium-albuterol (DUONEB) 0.5-2.5 (3) MG/3ML nebulizer solution 3 mL (3 mLs Nebulization Given 11/24/17 0624)  ipratropium-albuterol (DUONEB) 0.5-2.5 (3) MG/3ML nebulizer solution 3 mL (3 mLs Nebulization Given 11/24/17 0624)  ipratropium-albuterol (DUONEB) 0.5-2.5 (3) MG/3ML nebulizer solution 3 mL (3 mLs Nebulization Given 11/24/17 0624)  aspirin chewable tablet 324 mg (324 mg Oral Given 11/24/17 0700)     ED Discharge Orders    None       Note:  This document was prepared using Dragon voice recognition software and may include unintentional dictation errors.    Loleta Rose, MD 11/24/17 Darliss Ridgel    Loleta Rose, MD 11/24/17 [phone]

## 2017-11-24 NOTE — ED Notes (Signed)
IV team unable to obtain enough blood for blue tube, lab notified to come draw blue top.

## 2017-11-24 NOTE — ED Notes (Signed)
IV team at bedside 

## 2017-11-24 NOTE — Progress Notes (Signed)
*  PRELIMINARY RESULTS* Echocardiogram 2D Echocardiogram has been performed.  Cristela BlueHege, Cornelious Bartolucci 11/24/2017, 3:18 PM

## 2017-11-24 NOTE — Progress Notes (Signed)
ANTICOAGULATION CONSULT NOTE - Initial Consult  Pharmacy Consult for heparin drip Indication: NSTEMI  No Known Allergies  Patient Measurements: Height: 5\' 4"  (162.6 cm) Weight: 192 lb 14.4 oz (87.5 kg) IBW/kg (Calculated) : 59.2 Heparin Dosing Weight: 81kg  Vital Signs: Temp: 97.5 F (36.4 C) (03/22 1200) Temp Source: Axillary (03/22 1200) BP: 150/74 (03/22 1500) Pulse Rate: 74 (03/22 1500)  Labs: Recent Labs    11/24/17 0621 11/24/17 0749 11/24/17 1407  HGB 11.5*  --   --   HCT 35.8*  --   --   PLT 267  --   --   APTT  --  26  --   LABPROT  --  13.4  --   INR  --  1.03  --   HEPARINUNFRC  --   --  0.11*  CREATININE 0.76  --   --   TROPONINI 2.09* 2.19* 1.75*    Estimated Creatinine Clearance: 93 mL/min (by C-G formula based on SCr of 0.76 mg/dL).   Medical History: Past Medical History:  Diagnosis Date  . ACS (acute coronary syndrome) (HCC)    unstable angina in 2017 leading to catheterization/PCI by Dr. Juliann Paresallwood  . Arthritis   . Asthma   . CHF (congestive heart failure) (HCC)   . COPD (chronic obstructive pulmonary disease) (HCC)   . Diabetes mellitus without complication (HCC)   . Hepatitis C   . Hypertension     Medications:  No anticoagulation in PTA meds  Assessment: Trop 2.09  Goal of Therapy:  Heparin level 0.3-0.7 units/ml Monitor platelets by anticoagulation protocol: Yes   Plan:  3/22 @ 1407, HL=0.11, subtherapeutic. Will bolus 2400 units followed by an increase in heparin drip to 1250 units/hr. Recheck HL in 6 hours.   Cleopatra CedarStephanie Lynnel Zanetti, PharmD Pharmacy Resident  11/24/2017,3:43 PM

## 2017-11-24 NOTE — Progress Notes (Signed)
ANTICOAGULATION CONSULT NOTE - Initial Consult  Pharmacy Consult for heparin drip Indication: NSTEMI  No Known Allergies  Patient Measurements: Height: 5\' 6"  (167.6 cm) Weight: 185 lb (83.9 kg) IBW/kg (Calculated) : 63.8 Heparin Dosing Weight: 81kg  Vital Signs: Temp: 96.4 F (35.8 C) (03/22 0626) Temp Source: Axillary (03/22 0626) BP: 159/92 (03/22 0626) Pulse Rate: 106 (03/22 0626)  Labs: Recent Labs    11/24/17 0621  HGB 11.5*  HCT 35.8*  PLT 267  CREATININE 0.76  TROPONINI 2.09*    Estimated Creatinine Clearance: 94.7 mL/min (by C-G formula based on SCr of 0.76 mg/dL).   Medical History: Past Medical History:  Diagnosis Date  . ACS (acute coronary syndrome) (HCC)    unstable angina in 2017 leading to catheterization/PCI by Dr. Juliann Paresallwood  . Arthritis   . Asthma   . CHF (congestive heart failure) (HCC)   . COPD (chronic obstructive pulmonary disease) (HCC)   . Diabetes mellitus without complication (HCC)   . Hepatitis C   . Hypertension     Medications:  No anticoagulation in PTA meds  Assessment: Trop 2.09  Goal of Therapy:  Heparin level 0.3-0.7 units/ml Monitor platelets by anticoagulation protocol: Yes   Plan:  4000 unit bolus and initial rate of 950 units/hr. First heparin level 6 hours after start of infusion.  Kyle Rich S 11/24/2017,7:11 AM

## 2017-11-25 ENCOUNTER — Inpatient Hospital Stay: Payer: Medicaid Other

## 2017-11-25 DIAGNOSIS — F17218 Nicotine dependence, cigarettes, with other nicotine-induced disorders: Secondary | ICD-10-CM | POA: Diagnosis not present

## 2017-11-25 DIAGNOSIS — J9601 Acute respiratory failure with hypoxia: Secondary | ICD-10-CM | POA: Diagnosis not present

## 2017-11-25 DIAGNOSIS — I214 Non-ST elevation (NSTEMI) myocardial infarction: Secondary | ICD-10-CM | POA: Diagnosis not present

## 2017-11-25 DIAGNOSIS — J441 Chronic obstructive pulmonary disease with (acute) exacerbation: Secondary | ICD-10-CM | POA: Diagnosis not present

## 2017-11-25 LAB — BASIC METABOLIC PANEL
Anion gap: 10 (ref 5–15)
BUN: 34 mg/dL — AB (ref 6–20)
CO2: 31 mmol/L (ref 22–32)
Calcium: 8.5 mg/dL — ABNORMAL LOW (ref 8.9–10.3)
Chloride: 101 mmol/L (ref 101–111)
Creatinine, Ser: 0.86 mg/dL (ref 0.61–1.24)
GFR calc Af Amer: >60 mL/min (ref >60)
Glucose, Bld: 122 mg/dL — ABNORMAL HIGH (ref 65–99)
Potassium: 3.8 mmol/L (ref 3.5–5.1)
SODIUM: 142 mmol/L (ref 135–145)

## 2017-11-25 LAB — CBC
HCT: 36.3 % — ABNORMAL LOW (ref 40.0–52.0)
Hemoglobin: 11.6 g/dL — ABNORMAL LOW (ref 13.0–18.0)
MCH: 29.1 pg (ref 26.0–34.0)
MCHC: 32 g/dL (ref 32.0–36.0)
MCV: 91 fL (ref 80.0–100.0)
PLATELETS: 294 10*3/uL (ref 150–440)
RBC: 3.99 MIL/uL — ABNORMAL LOW (ref 4.40–5.90)
RDW: 14.8 % — AB (ref 11.5–14.5)
WBC: 20.1 10*3/uL — AB (ref 3.8–10.6)

## 2017-11-25 LAB — GLUCOSE, CAPILLARY
GLUCOSE-CAPILLARY: 105 mg/dL — AB (ref 65–99)
GLUCOSE-CAPILLARY: 166 mg/dL — AB (ref 65–99)
Glucose-Capillary: 163 mg/dL — ABNORMAL HIGH (ref 65–99)
Glucose-Capillary: 155 mg/dL — ABNORMAL HIGH (ref 65–99)

## 2017-11-25 LAB — LIPID PANEL
Cholesterol: 96 mg/dL (ref 0–200)
HDL: 31 mg/dL — ABNORMAL LOW (ref >40)
LDL CALC: 49 mg/dL (ref 0–99)
TRIGLYCERIDES: 79 mg/dL (ref <150)
Total CHOL/HDL Ratio: 3.1 RATIO
VLDL: 16 mg/dL (ref 0–40)

## 2017-11-25 LAB — MAGNESIUM: Magnesium: 1.9 mg/dL (ref 1.7–2.4)

## 2017-11-25 LAB — HEPARIN LEVEL (UNFRACTIONATED)
HEPARIN UNFRACTIONATED: 0.61 [IU]/mL (ref 0.30–0.70)
Heparin Unfractionated: <0.1 IU/mL — ABNORMAL LOW (ref 0.30–0.70)
Heparin Unfractionated: 0.52 IU/mL (ref 0.30–0.70)

## 2017-11-25 LAB — PROCALCITONIN: Procalcitonin: <0.1 ng/mL

## 2017-11-25 MED ORDER — INFLUENZA VAC SPLIT QUAD 0.5 ML IM SUSY
0.5000 mL | PREFILLED_SYRINGE | INTRAMUSCULAR | Status: AC
  Administered 2017-11-27: 0.5 mL via INTRAMUSCULAR
  Filled 2017-11-25: qty 0.5

## 2017-11-25 MED ORDER — METHYLPREDNISOLONE SODIUM SUCC 125 MG IJ SOLR
60.0000 mg | Freq: Three times a day (TID) | INTRAMUSCULAR | Status: DC
  Administered 2017-11-25 – 2017-11-27 (×7): 60 mg via INTRAVENOUS
  Filled 2017-11-25 (×7): qty 2

## 2017-11-25 MED ORDER — ORAL CARE MOUTH RINSE
15.0000 mL | Freq: Two times a day (BID) | OROMUCOSAL | Status: DC
  Administered 2017-11-26 (×2): 15 mL via OROMUCOSAL

## 2017-11-25 MED ORDER — HEPARIN BOLUS VIA INFUSION
2400.0000 [IU] | Freq: Once | INTRAVENOUS | Status: AC
  Administered 2017-11-25: 2400 [IU] via INTRAVENOUS
  Filled 2017-11-25: qty 2400

## 2017-11-25 MED ORDER — IPRATROPIUM-ALBUTEROL 0.5-2.5 (3) MG/3ML IN SOLN
3.0000 mL | RESPIRATORY_TRACT | Status: DC
  Administered 2017-11-25 – 2017-11-29 (×24): 3 mL via RESPIRATORY_TRACT
  Filled 2017-11-25 (×23): qty 3

## 2017-11-25 MED ORDER — PNEUMOCOCCAL VAC POLYVALENT 25 MCG/0.5ML IJ INJ
0.5000 mL | INJECTION | INTRAMUSCULAR | Status: AC
  Administered 2017-11-27: 0.5 mL via INTRAMUSCULAR
  Filled 2017-11-25: qty 0.5

## 2017-11-25 MED ORDER — CHLORHEXIDINE GLUCONATE 0.12 % MT SOLN
15.0000 mL | Freq: Two times a day (BID) | OROMUCOSAL | Status: DC
  Administered 2017-11-25 – 2017-11-29 (×6): 15 mL via OROMUCOSAL
  Filled 2017-11-25 (×8): qty 15

## 2017-11-25 NOTE — Progress Notes (Signed)
PULMONARY / CRITICAL CARE MEDICINE   Name: Kyle Rich MRN: [id number] DOB: July 28, 1956    ADMISSION DATE:  11/24/2017  HISTORY OF PRESENT ILLNESS:   Mr. Kyle Rich y.o.malewith a known history of CAD, COPD presents with shortness of breath going on for a couple days. The patient is currently on BiPAP in the ER 40% FiO2 and using accessory muscles to breathe. He complains of shortness of breath, fever and chills, weight gain, coughing up yellow phlegm and wheezing. He feels like he is going to die. He stated he had a couple stents a few months ago but I am not seeing any record of that in the chart. Last cardiac catheterization we saw is in 2017. The patient does complain of chest pain but is not the best historian and cannot describe it. States it is severe In the center of his chest. Complains of some abdominal pain. No nausea or vomiting or diaphoresis. Hospitalist services were contacted for further evaluation. In the ER his troponin was 2.    REVIEW OF SYSTEMS:  Persistent dysnoea  SUBJECTIVE:  Patient reports he feels a little better but stil short of breath at rest.  However he has had no chest pain.  VITAL SIGNS: BP 110/77   Pulse 85   Temp 97.7 F (36.5 C) (Axillary)   Resp 18   Ht 5\' 4"  (1.626 m)   Wt 192 lb 14.4 oz (87.5 kg)   SpO2 91%   BMI 33.11 kg/m   HEMODYNAMICS:   Nil VENTILATOR SETTINGS: FiO2 (%):  [30 %] 30 %  INTAKE / OUTPUT: I/O last 3 completed shifts: In: 582.2 [P.O.:200; I.V.:282.2; IV Piggyback:100] Out: 2625 [Urine:2625]  PHYSICAL EXAMINATION: General:  Comfortable in bed but short of breath at rest off BIPAP Neuro:  AAO x3, no deficits HEENT:  PERRL Cardiovascular:  RRR, no murmurs Lungs: poor air entry, occasional wheezes Abdomen:  Soft, audible BS Musculoskeletal:  No deformities Skin:  Warm and dry, no oedema  LABS:  BMET Recent Labs  Lab 11/24/17 0621 11/25/17 0502  NA 139 142  K 3.8 3.8  CL 103 101   CO2 28 31  BUN 25* 34*  CREATININE 0.76 0.86  GLUCOSE 159* 122*    Electrolytes Recent Labs  Lab 11/24/17 0621 11/25/17 0502  CALCIUM 8.3* 8.5*  MG 2.0 1.9    CBC Recent Labs  Lab 11/24/17 0621 11/25/17 0502  WBC 20.0* 20.1*  HGB 11.5* 11.6*  HCT 35.8* 36.3*  PLT 267 294    Coag's Recent Labs  Lab 11/24/17 0749  APTT 26  INR 1.03    Sepsis Markers Recent Labs  Lab 11/24/17 0749 11/25/17 0502  PROCALCITON <0.10 <0.10    ABG Recent Labs  Lab 11/24/17 0621  PHART 7.30*  PCO2ART 69*  PO2ART 153*    Liver Enzymes Recent Labs  Lab 11/24/17 0621  AST 39  ALT 29  ALKPHOS 69  BILITOT 0.6  ALBUMIN 3.7    Cardiac Enzymes Recent Labs  Lab 11/24/17 0621 11/24/17 0749 11/24/17 1407  TROPONINI 2.09* 2.19* 1.75*    Glucose Recent Labs  Lab 11/24/17 0925 11/24/17 1128 11/24/17 1637 11/24/17 2111 11/25/17 0738 11/25/17 1139  GLUCAP 149* 178* 168* 139* 105* 163*    Imaging Dg Chest Port 1 View  Result Date: 11/25/2017 CLINICAL DATA:  Respiratory failure, history asthma, CHF, COPD, diabetes mellitus, hypertension EXAM: PORTABLE CHEST 1 VIEW COMPARISON:  Portable exam 0806 hours compared to 11/24/2017 FINDINGS: Upper normal heart size. Mediastinal  contours and pulmonary vascularity normal. Interstitial prominence particularly in RIGHT lung appears improved. No segmental consolidation, pleural effusion or pneumothorax. Bones demineralized. IMPRESSION: Improved interstitial prominence question improved mild pulmonary edema. Electronically Signed   By: Ulyses SouthwardMark  Boles M.D.   On: 11/25/2017 10:10     STUDIES:  3/22 ECHO- results pending  CULTURES: Nil  ANTIBIOTICS: 3/22 Azithromycin, Rocephin  SIGNIFICANT EVENTS: Nil  LINES/TUBES: Peripheral  DISCUSSION: This 62 year old gentleman with severe COPD and still smoking was admitted with NSTEMI and in pulmonary oedema.  We are also treating him for acute exacerbation of COPD.  ASSESSMENT /  PLAN:  1. Acute on chronic hypoxic and hypercarbic Respiratory Failure requiring NIPPV multifactorial 2. Acute NSTEMI with acute pulmonary oedema- radiographically and clinically better from cardiac stand point. 3. Acute exacerbation of COPD with probable acute bronchitis- still having severe bronchospasm 4. Hx of Type 2 diabetes mellitus 5. Hx of hyperlipidemia 6. Active ongoing nicotine dependency 7. Leukocytosis is likely from steroid therapy.  Plan: 1. Continue on NIPPV and liberate as per symptoms, Steroids increased to 60 mg iv Q8, continue with inhaled steroids and nebulizer 2. Continue on Heparin, beta blocker, ASA, nitrates. Seen by Dr. Juliann Paresallwood.  He hopes to consider cath if patient's pulmonary function improves. ECHO reults pending. 3. Continue on empirical ABX, bronchodilators, steroids 4. Target glucose management 5. Anti-lipid therapy 6. Smoking cessation counseling     FAMILY  - Updates: when available  I have dedicated a total of 37 minutes in critical care time minus all appropriate exclusion.   Jackson LatinoKarol Christna Kulick, MD Pulmonary and Critical Care Medicine [facility] Pager: [phone]  11/25/2017, 1:31 PM

## 2017-11-25 NOTE — Progress Notes (Signed)
Dr. Peggye Pittichards at bedside to assess patient while he's off BiPAP.  MD gave order to increase frequency of nebs and increase steroid dose and frequency along with order for chest xray and diet.

## 2017-11-25 NOTE — Progress Notes (Signed)
ANTICOAGULATION CONSULT NOTE - Initial Consult  Pharmacy Consult for heparin drip Indication: NSTEMI  No Known Allergies  Patient Measurements: Height: 5\' 4"  (162.6 cm) Weight: 192 lb 14.4 oz (87.5 kg) IBW/kg (Calculated) : 59.2 Heparin Dosing Weight: 81kg  Vital Signs: Temp: 97.8 F (36.6 C) (03/23 0000) Temp Source: Axillary (03/23 0000) BP: 157/74 (03/23 0100) Pulse Rate: 72 (03/23 0100)  Labs: Recent Labs    11/24/17 0621 11/24/17 0749 11/24/17 1407 11/24/17 2235  HGB 11.5*  --   --   --   HCT 35.8*  --   --   --   PLT 267  --   --   --   APTT  --  26  --   --   LABPROT  --  13.4  --   --   INR  --  1.03  --   --   HEPARINUNFRC  --   --  0.11* <0.10*  CREATININE 0.76  --   --   --   TROPONINI 2.09* 2.19* 1.75*  --     Estimated Creatinine Clearance: 93 mL/min (by C-G formula based on SCr of 0.76 mg/dL).   Medical History: Past Medical History:  Diagnosis Date  . ACS (acute coronary syndrome) (HCC)    unstable angina in 2017 leading to catheterization/PCI by Dr. Juliann Paresallwood  . Arthritis   . Asthma   . CHF (congestive heart failure) (HCC)   . COPD (chronic obstructive pulmonary disease) (HCC)   . Diabetes mellitus without complication (HCC)   . Hepatitis C   . Hypertension     Medications:  No anticoagulation in PTA meds  Assessment: Trop 2.09  Goal of Therapy:  Heparin level 0.3-0.7 units/ml Monitor platelets by anticoagulation protocol: Yes   Plan:  3/22 @ 1407, HL=0.11, subtherapeutic. Will bolus 2400 units followed by an increase in heparin drip to 1250 units/hr. Recheck HL in 6 hours.   03/22 2230 heparin level <0.1. 2400 unit bolus and increase rate to 1550 units/hr. Recheck in 8 hours.  Erich MontaneMcBane,Trevaris Pennella S, PharmD Pharmacy Resident  11/25/2017,2:00 AM

## 2017-11-25 NOTE — Progress Notes (Signed)
   11/25/17 1900  Clinical Encounter Type  Visited With Patient  Visit Type Initial  Referral From Nurse  Consult/Referral To Chaplain   Responded to order request for advanced directives.  Patient initially thought chaplain was speaking about financial/legal matters.  Chaplain continued to outline what an advanced directive was until patient expressed understanding.  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  Patient stated that he did not want to complete an advanced directive at this time.  Chaplain encouraged patient to [place] chaplain if he changed his mind or if he would like emotional or spiritual support.

## 2017-11-25 NOTE — Progress Notes (Signed)
[employer] [facility] - [place] at [facility]   PATIENT NAME: Kyle GuarneriJames Rich    MR#:  [id number]  DATE OF BIRTH:  31-Jan-1956  SUBJECTIVE:   Patient admitted to the hospital secondary to shortness of breath due to COPD exacerbation and is currently on BiPAP.  Also noted to have an elevated troponin.  Patient presently denies any chest pain but difficult to wean off BiPAP presently.  REVIEW OF SYSTEMS:    Review of Systems  Constitutional: Negative for chills and fever.  HENT: Negative for congestion and tinnitus.   Eyes: Negative for blurred vision and double vision.  Respiratory: Positive for cough, shortness of breath and wheezing.   Cardiovascular: Negative for chest pain, orthopnea and PND.  Gastrointestinal: Negative for abdominal pain, diarrhea, nausea and vomiting.  Genitourinary: Negative for dysuria and hematuria.  Neurological: Negative for dizziness, sensory change and focal weakness.  All other systems reviewed and are negative.   Nutrition: Heart Healthy Tolerating Diet: Yes Tolerating PT: Await Eval.   DRUG ALLERGIES:  No Known Allergies  VITALS:  Blood pressure 132/63, pulse 75, temperature 97.7 F (36.5 C), temperature source Axillary, resp. rate (!) 30, height 5\' 4"  (1.626 m), weight 87.5 kg (192 lb 14.4 oz), SpO2 98 %.  PHYSICAL EXAMINATION:   Physical Exam  GENERAL:  62 y.o.-year-old patient lying in bed in mild respiratory distress on BiPAP. EYES: Pupils equal, round, reactive to light and accommodation. No scleral icterus. Extraocular muscles intact.  HEENT: Head atraumatic, normocephalic. Oropharynx and nasopharynx clear.  NECK:  Supple, no jugular venous distention. No thyroid enlargement, no tenderness.  LUNGS: Good air entry bilaterally, diffuse inspiratory and expiratory wheezing.  No rhonchi, rales.  No use of accessory muscles. CARDIOVASCULAR: S1, S2 normal. No murmurs, rubs, or gallops.  ABDOMEN: Soft, nontender, nondistended. Bowel sounds  present. No organomegaly or mass.  EXTREMITIES: No cyanosis, clubbing or edema b/l.    NEUROLOGIC: Cranial nerves II through XII are intact. No focal Motor or sensory deficits b/l.   PSYCHIATRIC: The patient is alert and oriented x 3.  SKIN: No obvious rash, lesion, or ulcer.    LABORATORY PANEL:   CBC Recent Labs  Lab 11/25/17 0502  WBC 20.1*  HGB 11.6*  HCT 36.3*  PLT 294   ------------------------------------------------------------------------------------------------------------------  Chemistries  Recent Labs  Lab 11/24/17 0621 11/25/17 0502  NA 139 142  K 3.8 3.8  CL 103 101  CO2 28 31  GLUCOSE 159* 122*  BUN 25* 34*  CREATININE 0.76 0.86  CALCIUM 8.3* 8.5*  MG 2.0 1.9  AST 39  --   ALT 29  --   ALKPHOS 69  --   BILITOT 0.6  --    ------------------------------------------------------------------------------------------------------------------  Cardiac Enzymes Recent Labs  Lab 11/24/17 1407  TROPONINI 1.75*   ------------------------------------------------------------------------------------------------------------------  RADIOLOGY:  Dg Chest Port 1 View  Result Date: 11/25/2017 CLINICAL DATA:  Respiratory failure, history asthma, CHF, COPD, diabetes mellitus, hypertension EXAM: PORTABLE CHEST 1 VIEW COMPARISON:  Portable exam 0806 hours compared to 11/24/2017 FINDINGS: Upper normal heart size. Mediastinal contours and pulmonary vascularity normal. Interstitial prominence particularly in RIGHT lung appears improved. No segmental consolidation, pleural effusion or pneumothorax. Bones demineralized. IMPRESSION: Improved interstitial prominence question improved mild pulmonary edema. Electronically Signed   By: Ulyses SouthwardMark  Boles M.D.   On: 11/25/2017 10:10   Dg Chest Portable 1 View  Result Date: 11/24/2017 CLINICAL DATA:  Respiratory distress. Severe respiratory difficulty. History of COPD and CHF. EXAM: PORTABLE CHEST 1 VIEW COMPARISON:  07/19/2017 FINDINGS:  Lungs are hyperinflated. Unchanged heart size and mediastinal contours. Bronchial and interstitial thickening with minimal Kerley B-lines suspicious for pulmonary edema. No confluent airspace disease. No pleural effusion or pneumothorax. No acute osseous abnormalities are seen. IMPRESSION: Bronchial and interstitial thickening suspicious for pulmonary edema. Chronic hyperinflation. Electronically Signed   By: Rubye Oaks M.D.   On: 11/24/2017 06:54     ASSESSMENT AND PLAN:   62 year old male with past medical history of hypertension, hepatitis C, diabetes, COPD, history of CHF who presented to the hospital with chest pain and shortness of breath and noted to be in acute on chronic respiratory failure with hypoxia.  1.  Acute on chronic respiratory failure with hypoxia-secondary to COPD exacerbation. -Continue BiPAP support.  Continue IV steroids, scheduled duo nebs, Pulmicort nebs.  Continue empiric ceftriaxone, Zithromax.  2.  COPD exacerbation-this is the cause of patient's worsening respiratory failure with hypoxia. -Continue treatment as mentioned above.  3.  Elevated troponin- etiology unclear, supply demand ischemia versus possible non-ST elevation MI.  Patient denies any acute chest pain. -Cycle cardiac markers, continue heparin, aspirin, atorvastatin, Plavix, metoprolol.  Await echocardiogram results. -Await cardiology input.  4.  GERD-continue Protonix.    5.  Diabetes type 2 without complication-continue sliding scale insulin.  6.  Depression/anxiety-continue Lexapro/Xanax.  All the records are reviewed and case discussed with Care Management/Social Worker. Management plans discussed with the patient, family and they are in agreement.  CODE STATUS: Full code  DVT Prophylaxis: Heparin drip  TOTAL TIME TAKING CARE OF THIS PATIENT: 30 minutes.   POSSIBLE D/C IN 2-3 DAYS, DEPENDING ON CLINICAL CONDITION.   [place] Siren M.D on 11/25/2017 at 3:58 PM  Between 7am to  6pm - Pager - [phone]  After 6pm go to [email or web address] - Social research officer, government  [facility] [place] [facility]  Office  [phone]  CC: Primary care physician; Rayetta Humphrey, MD

## 2017-11-25 NOTE — Progress Notes (Signed)
Watching tv on high flow nasal cannula with no respiratory distress at rest.  Dyspnic with exertion.  Tolerating diet.  Excellent urine output from lasix.

## 2017-11-25 NOTE — Progress Notes (Signed)
ANTICOAGULATION CONSULT NOTE - Follow Up Consult  Pharmacy Consult for heparin drip Indication: NSTEMI  No Known Allergies  Patient Measurements: Height: 5\' 4"  (162.6 cm) Weight: 192 lb 14.4 oz (87.5 kg) IBW/kg (Calculated) : 59.2 Heparin Dosing Weight: 81kg  Vital Signs: Temp: 97.7 F (36.5 C) (03/23 1150) Temp Source: Axillary (03/23 1150) BP: 132/63 (03/23 1400) Pulse Rate: 75 (03/23 1400)  Labs: Recent Labs    11/24/17 0621 11/24/17 0749  11/24/17 1407 11/24/17 2235 11/25/17 0502 11/25/17 0743 11/25/17 1519  HGB 11.5*  --   --   --   --  11.6*  --   --   HCT 35.8*  --   --   --   --  36.3*  --   --   PLT 267  --   --   --   --  294  --   --   APTT  --  26  --   --   --   --   --   --   LABPROT  --  13.4  --   --   --   --   --   --   INR  --  1.03  --   --   --   --   --   --   HEPARINUNFRC  --   --    < > 0.11* <0.10*  --  0.52 0.61  CREATININE 0.76  --   --   --   --  0.86  --   --   TROPONINI 2.09* 2.19*  --  1.75*  --   --   --   --    < > = values in this interval not displayed.    Estimated Creatinine Clearance: 86.5 mL/min (by C-G formula based on SCr of 0.86 mg/dL).  Medical History: Past Medical History:  Diagnosis Date  . ACS (acute coronary syndrome) (HCC)    unstable angina in 2017 leading to catheterization/PCI by Dr. Juliann Paresallwood  . Arthritis   . Asthma   . CHF (congestive heart failure) (HCC)   . COPD (chronic obstructive pulmonary disease) (HCC)   . Diabetes mellitus without complication (HCC)   . Hepatitis C   . Hypertension     Medications:  No anticoagulation in PTA meds  Assessment: Trop 2.09  Goal of Therapy:  Heparin level 0.3-0.7 units/ml Monitor platelets by anticoagulation protocol: Yes   Plan:  3/22 @ 1407, HL=0.11, subtherapeutic. Will bolus 2400 units followed by an increase in heparin drip to 1250 units/hr. Recheck HL in 6 hours.   3/22  2230 heparin level <0.1. 2400 unit bolus and increase rate to 1550 units/hr.  Recheck in 8 hours.   3/23 at 07:43  HL = 0.52.  Will recheck HL today at 14:00.  3/23 15:19 HL therapeutic x 2. Continue current rate. Will recheck HL and CBC daily per protocol.  Carola Frostathan A Izack Hoogland, [facility] HealthRPH  11/25/2017,3:43 PM

## 2017-11-25 NOTE — Progress Notes (Signed)
ANTICOAGULATION CONSULT NOTE - Follow Up Consult  Pharmacy Consult for heparin drip Indication: NSTEMI  No Known Allergies  Patient Measurements: Height: 5\' 4"  (162.6 cm) Weight: 192 lb 14.4 oz (87.5 kg) IBW/kg (Calculated) : 59.2 Heparin Dosing Weight: 81kg  Vital Signs: Temp: 97.6 F (36.4 C) (03/23 0727) Temp Source: Axillary (03/23 0727) BP: 140/112 (03/23 0800) Pulse Rate: 90 (03/23 0800)  Labs: Recent Labs    11/24/17 0621 11/24/17 0749 11/24/17 1407 11/24/17 2235 11/25/17 0502 11/25/17 0743  HGB 11.5*  --   --   --  11.6*  --   HCT 35.8*  --   --   --  36.3*  --   PLT 267  --   --   --  294  --   APTT  --  26  --   --   --   --   LABPROT  --  13.4  --   --   --   --   INR  --  1.03  --   --   --   --   HEPARINUNFRC  --   --  0.11* <0.10*  --  0.52  CREATININE 0.76  --   --   --  0.86  --   TROPONINI 2.09* 2.19* 1.75*  --   --   --     Estimated Creatinine Clearance: 86.5 mL/min (by C-G formula based on SCr of 0.86 mg/dL).  Medical History: Past Medical History:  Diagnosis Date  . ACS (acute coronary syndrome) (HCC)    unstable angina in 2017 leading to catheterization/PCI by Dr. Juliann Paresallwood  . Arthritis   . Asthma   . CHF (congestive heart failure) (HCC)   . COPD (chronic obstructive pulmonary disease) (HCC)   . Diabetes mellitus without complication (HCC)   . Hepatitis C   . Hypertension     Medications:  No anticoagulation in PTA meds  Assessment: Trop 2.09  Goal of Therapy:  Heparin level 0.3-0.7 units/ml Monitor platelets by anticoagulation protocol: Yes   Plan:  3/22 @ 1407, HL=0.11, subtherapeutic. Will bolus 2400 units followed by an increase in heparin drip to 1250 units/hr. Recheck HL in 6 hours.   3/22  2230 heparin level <0.1. 2400 unit bolus and increase rate to 1550 units/hr. Recheck in 8 hours.   3/23 at 07:43  HL = 0.52.  Will recheck HL today at 14:00.  Stormy CardKatsoudas,Reynalda Canny K, RPH  11/25/2017,9:05 AM

## 2017-11-26 DIAGNOSIS — I214 Non-ST elevation (NSTEMI) myocardial infarction: Secondary | ICD-10-CM | POA: Diagnosis not present

## 2017-11-26 DIAGNOSIS — J9601 Acute respiratory failure with hypoxia: Secondary | ICD-10-CM | POA: Diagnosis not present

## 2017-11-26 DIAGNOSIS — J440 Chronic obstructive pulmonary disease with acute lower respiratory infection: Secondary | ICD-10-CM | POA: Diagnosis not present

## 2017-11-26 DIAGNOSIS — J441 Chronic obstructive pulmonary disease with (acute) exacerbation: Secondary | ICD-10-CM | POA: Diagnosis not present

## 2017-11-26 LAB — CBC
HCT: 38.5 % — ABNORMAL LOW (ref 40.0–52.0)
Hemoglobin: 12.3 g/dL — ABNORMAL LOW (ref 13.0–18.0)
MCH: 29 pg (ref 26.0–34.0)
MCHC: 31.9 g/dL — ABNORMAL LOW (ref 32.0–36.0)
MCV: 90.9 fL (ref 80.0–100.0)
PLATELETS: 302 10*3/uL (ref 150–440)
RBC: 4.24 MIL/uL — ABNORMAL LOW (ref 4.40–5.90)
RDW: 14.6 % — AB (ref 11.5–14.5)
WBC: 18.6 10*3/uL — ABNORMAL HIGH (ref 3.8–10.6)

## 2017-11-26 LAB — BASIC METABOLIC PANEL
Anion gap: 10 (ref 5–15)
BUN: 43 mg/dL — AB (ref 6–20)
CALCIUM: 8.3 mg/dL — AB (ref 8.9–10.3)
CO2: 31 mmol/L (ref 22–32)
CREATININE: 0.94 mg/dL (ref 0.61–1.24)
Chloride: 97 mmol/L — ABNORMAL LOW (ref 101–111)
GFR calc Af Amer: >60 mL/min (ref >60)
GFR calc non Af Amer: >60 mL/min (ref >60)
GLUCOSE: 155 mg/dL — AB (ref 65–99)
Potassium: 4 mmol/L (ref 3.5–5.1)
Sodium: 138 mmol/L (ref 135–145)

## 2017-11-26 LAB — GLUCOSE, CAPILLARY
GLUCOSE-CAPILLARY: 152 mg/dL — AB (ref 65–99)
Glucose-Capillary: 146 mg/dL — ABNORMAL HIGH (ref 65–99)
Glucose-Capillary: 167 mg/dL — ABNORMAL HIGH (ref 65–99)
Glucose-Capillary: 160 mg/dL — ABNORMAL HIGH (ref 65–99)

## 2017-11-26 LAB — PROCALCITONIN

## 2017-11-26 LAB — HEPARIN LEVEL (UNFRACTIONATED)
Heparin Unfractionated: 1.23 IU/mL — ABNORMAL HIGH (ref 0.30–0.70)
Heparin Unfractionated: 0.87 IU/mL — ABNORMAL HIGH (ref 0.30–0.70)
Heparin Unfractionated: 0.48 IU/mL (ref 0.30–0.70)

## 2017-11-26 MED ORDER — DM-GUAIFENESIN ER 30-600 MG PO TB12: 1.0000 | ORAL_TABLET | Freq: Two times a day (BID) | ORAL | Status: DC

## 2017-11-26 MED ORDER — DEXTROMETHORPHAN POLISTIREX ER 30 MG/5ML PO SUER
30.0000 mg | Freq: Two times a day (BID) | ORAL | Status: DC
Start: 2017-11-26 — End: 2017-11-29
  Administered 2017-11-26 – 2017-11-29 (×7): 30 mg via ORAL
  Filled 2017-11-26 (×7): qty 5

## 2017-11-26 MED ORDER — GUAIFENESIN ER 600 MG PO TB12
600.0000 mg | ORAL_TABLET | Freq: Two times a day (BID) | ORAL | Status: DC
  Administered 2017-11-26 – 2017-11-29 (×7): 600 mg via ORAL
  Filled 2017-11-26 (×7): qty 1

## 2017-11-26 MED ORDER — HEPARIN (PORCINE) IN NACL 100-0.45 UNIT/ML-% IJ SOLN
1150.0000 [IU]/h | INTRAMUSCULAR | Status: DC
  Administered 2017-11-26 (×2): 1300 [IU]/h via INTRAVENOUS
  Filled 2017-11-26: qty 250

## 2017-11-26 NOTE — Progress Notes (Signed)
PULMONARY / CRITICAL CARE MEDICINE   Name: Kyle Rich MRN: [id number] DOB: 1956-02-15    ADMISSION DATE:  11/24/2017  HISTORY OF PRESENT ILLNESS:   Mr. ur[age] philip[age] y.o.malewith a known history of CAD, COPD presents with shortness of breath going on for a couple days. The patient is currently on BiPAP in the ER 40% FiO2 and using accessory muscles to breathe. He complains of shortness of breath, fever and chills, weight gain, coughing up yellow phlegm and wheezing. He feels like he is going to die. He stated he had a couple stents a few months ago but I am not seeing any record of that in the chart. Last cardiac catheterization we saw is in 2017. The patient does complain of chest pain but is not the best historian and cannot describe it. States it is severe In the center of his chest. Complains of some abdominal pain. No nausea or vomiting or diaphoresis. Hospitalist services were contacted for further evaluation. In the ER his troponin was 2.    REVIEW OF SYSTEMS:  Persistent dysnoea   SUBJECTIVE:  Patient reports he feels a little better but still short of breath at rest.  Remains chest pain free.  VITAL SIGNS: BP (!) 162/76   Pulse 78   Temp (!) 97.3 F (36.3 C)   Resp (!) 22   Ht 5\' 4"  (1.626 m)   Wt 192 lb 14.4 oz (87.5 kg)   SpO2 95%   BMI 33.11 kg/m   HEMODYNAMICS:   Nil VENTILATOR SETTINGS: FiO2 (%):  [35 %] 35 %  INTAKE / OUTPUT: I/O last 3 completed shifts: In: 1188 [P.O.:560; I.V.:528; IV Piggyback:100] Out: 3490 [Urine:3490]  PHYSICAL EXAMINATION: General:  Comfortable in bed but short of breath at rest off BIPAP Neuro:  AAO x3, no deficits HEENT:  PERRL Cardiovascular:  RRR, no murmurs Lungs: poor air entry, scattered wheezes Abdomen:  Soft, audible BS Musculoskeletal:  No deformities Skin:  Warm and dry, no oedema  LABS:  BMET Recent Labs  Lab 11/24/17 0621 11/25/17 0502 11/26/17 0522  NA 139 142 138  K 3.8 3.8 4.0  CL  103 101 97*  CO2 28 31 31   BUN 25* 34* 43*  CREATININE 0.76 0.86 0.94  GLUCOSE 159* 122* 155*    Electrolytes Recent Labs  Lab 11/24/17 0621 11/25/17 0502 11/26/17 0522  CALCIUM 8.3* 8.5* 8.3*  MG 2.0 1.9  --     CBC Recent Labs  Lab 11/24/17 0621 11/25/17 0502 11/26/17 0522  WBC 20.0* 20.1* 18.6*  HGB 11.5* 11.6* 12.3*  HCT 35.8* 36.3* 38.5*  PLT 267 294 302    Coag's Recent Labs  Lab 11/24/17 0749  APTT 26  INR 1.03    Sepsis Markers Recent Labs  Lab 11/24/17 0749 11/25/17 0502 11/26/17 0522  PROCALCITON <0.10 <0.10 <0.10    ABG Recent Labs  Lab 11/24/17 0621  PHART 7.30*  PCO2ART 69*  PO2ART 153*    Liver Enzymes Recent Labs  Lab 11/24/17 0621  AST 39  ALT 29  ALKPHOS 69  BILITOT 0.6  ALBUMIN 3.7    Cardiac Enzymes Recent Labs  Lab 11/24/17 0621 11/24/17 0749 11/24/17 1407  TROPONINI 2.09* 2.19* 1.75*    Glucose Recent Labs  Lab 11/25/17 0738 11/25/17 1139 11/25/17 1601 11/25/17 2215 11/26/17 0737 11/26/17 1211  GLUCAP 105* 163* 155* 166* 146* 167*    Imaging No results found.   STUDIES:  3/22 ECHO- results pending  CULTURES: Nil  ANTIBIOTICS: 3/22  Azithromycin, Rocephin  SIGNIFICANT EVENTS: Nil  LINES/TUBES: Peripheral  DISCUSSION: This 62 year old gentleman with severe COPD and still smoking was admitted with NSTEMI and in pulmonary oedema.  We are also treating him for acute exacerbation of COPD.  ASSESSMENT / PLAN:  1. Acute on chronic hypoxic and hypercarbic Respiratory Failure requiring NIPPV multifactorial; he is tolerating HFO during meal time 2. Acute NSTEMI with acute pulmonary oedema- radiographically and clinically better from cardiac stand point. 3. Acute exacerbation of COPD with probable acute bronchitis- still having severe bronchospasm 4. Hx of Type 2 diabetes mellitus 5. Hx of hyperlipidemia 6. Active ongoing nicotine dependency 7. Leukocytosis is likely from steroid  therapy.  Plan: 1. Continue on NIPPV and liberate as per symptoms,  continue Steroids, continue, inhaled steroids and nebulizer 2. Continue on Heparin, beta blocker, ASA, nitrates. Seen by Dr. Juliann Paresallwood.  He hopes to consider cath if patient's pulmonary function improves. ECHO reults pending. 3. Continue on empirical ABX, bronchodilators, steroids 4. Target glucose management 5. Anti-lipid therapy 6. Smoking cessation counseling, Nicotine patch D/C because of risk for vasoconstriction and BP has trended upward 7. Will add Mucinex    FAMILY  - Updates: when available  I have dedicated a total of 35 minutes in critical care time minus all appropriate exclusion.   Jackson LatinoKarol Marlenne Ridge, MD Pulmonary and Critical Care Medicine [facility] Pager: [phone]  11/26/2017, 3:14 PM

## 2017-11-26 NOTE — Progress Notes (Signed)
Bipap on standby at this time, pt on HFNC and tol well 

## 2017-11-26 NOTE — Progress Notes (Signed)
[employer] -  at [facility]   PATIENT NAME: Kyle Rich    MR#:  [id number]  DATE OF BIRTH:  December 05, 1955  SUBJECTIVE:   Patient is off BiPAP now and currently on high flow nasal cannula. Denies any chest pain or worsening shortness of breath. Still having significant wheezing and bronchospasm.  REVIEW OF SYSTEMS:    Review of Systems  Constitutional: Negative for chills and fever.  HENT: Negative for congestion and tinnitus.   Eyes: Negative for blurred vision and double vision.  Respiratory: Positive for cough, shortness of breath and wheezing.   Cardiovascular: Negative for chest pain, orthopnea and PND.  Gastrointestinal: Negative for abdominal pain, diarrhea, nausea and vomiting.  Genitourinary: Negative for dysuria and hematuria.  Neurological: Negative for dizziness, sensory change and focal weakness.  All other systems reviewed and are negative.   Nutrition: Heart Healthy Tolerating Diet: Yes Tolerating PT: Await Eval.   DRUG ALLERGIES:  No Known Allergies  VITALS:  Blood pressure 126/72, pulse 70, temperature (!) 97.3 F (36.3 C), resp. rate 20, height 5\' 4"  (1.626 m), weight 87.5 kg (192 lb 14.4 oz), SpO2 95 %.  PHYSICAL EXAMINATION:   Physical Exam  GENERAL:  62 y.o.-year-old patient lying in bed in mild respiratory distress on [place] .   EYES: Pupils equal, round, reactive to light and accommodation. No scleral icterus. Extraocular muscles intact.  HEENT: Head atraumatic, normocephalic. Oropharynx and nasopharynx clear.  NECK:  Supple, no jugular venous distention. No thyroid enlargement, no tenderness.  LUNGS: Good air entry bilaterally, diffuse inspiratory and expiratory wheezing.  No rhonchi, rales.  No use of accessory muscles. CARDIOVASCULAR: S1, S2 normal. No murmurs, rubs, or gallops.  ABDOMEN: Soft, nontender, nondistended. Bowel sounds present. No organomegaly or mass.  EXTREMITIES: No cyanosis, clubbing or edema b/l.     NEUROLOGIC: Cranial nerves II through XII are intact. No focal Motor or sensory deficits b/l.   PSYCHIATRIC: The patient is alert and oriented x 3.  SKIN: No obvious rash, lesion, or ulcer.    LABORATORY PANEL:   CBC Recent Labs  Lab 11/26/17 0522  WBC 18.6*  HGB 12.3*  HCT 38.5*  PLT 302   ------------------------------------------------------------------------------------------------------------------  Chemistries  Recent Labs  Lab 11/24/17 0621 11/25/17 0502 11/26/17 0522  NA 139 142 138  K 3.8 3.8 4.0  CL 103 101 97*  CO2 28 31 31   GLUCOSE 159* 122* 155*  BUN 25* 34* 43*  CREATININE 0.76 0.86 0.94  CALCIUM 8.3* 8.5* 8.3*  MG 2.0 1.9  --   AST 39  --   --   ALT 29  --   --   ALKPHOS 69  --   --   BILITOT 0.6  --   --    ------------------------------------------------------------------------------------------------------------------  Cardiac Enzymes Recent Labs  Lab 11/24/17 1407  TROPONINI 1.75*   ------------------------------------------------------------------------------------------------------------------  RADIOLOGY:  Dg Chest Port 1 View  Result Date: 11/25/2017 CLINICAL DATA:  Respiratory failure, history asthma, CHF, COPD, diabetes mellitus, hypertension EXAM: PORTABLE CHEST 1 VIEW COMPARISON:  Portable exam 0806 hours compared to 11/24/2017 FINDINGS: Upper normal heart size. Mediastinal contours and pulmonary vascularity normal. Interstitial prominence particularly in RIGHT lung appears improved. No segmental consolidation, pleural effusion or pneumothorax. Bones demineralized. IMPRESSION: Improved interstitial prominence question improved mild pulmonary edema. Electronically Signed   By: Ulyses Southward M.D.   On: 11/25/2017 10:10     ASSESSMENT AND PLAN:   62 year old male with past medical history of hypertension, hepatitis C, diabetes,  COPD, history of CHF who presented to the hospital with chest pain and shortness of breath and noted to be in  acute on chronic respiratory failure with hypoxia.  1.  Acute on chronic respiratory failure with hypoxia-secondary to COPD exacerbation. - pt. Is off Bipap now and now on Hiflo [place] and will cont. To wean as tolerated.  -Continue IV steroids, scheduled duo nebs, Pulmicort nebs.  Continue empiric ceftriaxone, Zithromax.  2.  COPD exacerbation-this was the cause of patient's worsening respiratory failure with hypoxia. -Continue treatment as mentioned above.  3.  Elevated troponin- etiology unclear, supply demand ischemia versus possible non-ST elevation MI.  Patient denies any acute chest pain. -continue heparin, aspirin, atorvastatin, Plavix, metoprolol.  Await echocardiogram results. -Cards considering Cath if Resp. Status improves and if pt. In agreement.   4.  GERD-continue Protonix.    5.  Diabetes type 2 without complication-continue sliding scale insulin. - BS stable.   6.  Depression/anxiety-continue Lexapro/Xanax.  All the records are reviewed and case discussed with Care Management/Social Worker. Management plans discussed with the patient, family and they are in agreement.  CODE STATUS: Full code  DVT Prophylaxis: Heparin drip  TOTAL TIME TAKING CARE OF THIS PATIENT: 30 minutes.   POSSIBLE D/C IN 2-3 DAYS, DEPENDING ON CLINICAL CONDITION.   [place] SirenSAINANI,Terral Cooks J M.D on 11/26/2017 at 3:29 PM  Between 7am to 6pm - Pager - [phone]  After 6pm go to [email or web address] - Social research officer, governmentpassword EPAS [email or web address]  [facility] [place] [facility]  Office  [phone]  CC: Primary care physician; [name], Sionne A, MD

## 2017-11-26 NOTE — Progress Notes (Addendum)
ANTICOAGULATION CONSULT NOTE - Follow Up Consult  Pharmacy Consult for heparin drip Indication: NSTEMI  No Known Allergies  Patient Measurements: Height: 5\' 4"  (162.6 cm) Weight: 192 lb 14.4 oz (87.5 kg) IBW/kg (Calculated) : 59.2 Heparin Dosing Weight: 81kg  Vital Signs: Temp: 97.4 F (36.3 C) (03/24 0100) Temp Source: Axillary (03/24 0100) BP: 156/73 (03/24 0600) Pulse Rate: 71 (03/24 0600)  Labs: Recent Labs    11/24/17 0621 11/24/17 0749 11/24/17 1407  11/25/17 0502 11/25/17 0743 11/25/17 1519 11/26/17 0522  HGB 11.5*  --   --   --  11.6*  --   --  12.3*  HCT 35.8*  --   --   --  36.3*  --   --  38.5*  PLT 267  --   --   --  294  --   --  302  APTT  --  26  --   --   --   --   --   --   LABPROT  --  13.4  --   --   --   --   --   --   INR  --  1.03  --   --   --   --   --   --   HEPARINUNFRC  --   --  0.11*   < >  --  0.52 0.61 1.23*  CREATININE 0.76  --   --   --  0.86  --   --  0.94  TROPONINI 2.09* 2.19* 1.75*  --   --   --   --   --    < > = values in this interval not displayed.    Estimated Creatinine Clearance: 79.2 mL/min (by C-G formula based on SCr of 0.94 mg/dL).  Medical History: Past Medical History:  Diagnosis Date  . ACS (acute coronary syndrome) (HCC)    unstable angina in 2017 leading to catheterization/PCI by Dr. Juliann Paresallwood  . Arthritis   . Asthma   . CHF (congestive heart failure) (HCC)   . COPD (chronic obstructive pulmonary disease) (HCC)   . Diabetes mellitus without complication (HCC)   . Hepatitis C   . Hypertension     Medications:  No anticoagulation in PTA meds  Assessment: Trop 2.09  Goal of Therapy:  Heparin level 0.3-0.7 units/ml Monitor platelets by anticoagulation protocol: Yes   Plan:  3/22 @ 1407, HL=0.11, subtherapeutic. Will bolus 2400 units followed by an increase in heparin drip to 1250 units/hr. Recheck HL in 6 hours.   3/22  2230 heparin level <0.1. 2400 unit bolus and increase rate to 1550 units/hr.  Recheck in 8 hours.   3/23 at 07:43  HL = 0.52.  Will recheck HL today at 14:00.  3/23 15:19 HL therapeutic x 2. Continue current rate. Will recheck HL and CBC daily per protocol.  03/24 AM heparin level 1.23. Hold drip x1 hour. Restart at 1300 units/hr. Recheck 6 hours after restart.  Lindsie Simar S, RPH  11/26/2017,7:14 AM

## 2017-11-26 NOTE — Consult Note (Signed)
Reason for Consult: Shortness of breath known coronary disease elevated troponin Referring Physician: Dr. Iona Beard primary Dr. Loletha Grayer hospitalist Cardiologist Kyle Rich is an 62 y.o. male.  HPI: Patient has a history of known coronary disease multivessel moderate to severe COPD congestive heart failure diabetes and hypertension patient presented with worsening shortness of breath history failure with hypoxemia he was placed on BiPAP to help with breathing because he was using accessory muscles.  Patient complains of some fever chills weight gain coughing up yellow phlegm with significant wheezing.  Patient still smokes patient states he has had cardiac cath and referred for coronary bypass surgery at [facility] in 2017 the patient was thought to be a poor surgical candidate because of lung disease patient has continued to smoke none the last has not followed up with cardiology now here with respiratory failure for further assessment evaluation denies any significant recent chest pain.  Shortness of breath is better now after BiPAP and aggressive pulmonary therapy  Past Medical History:  Diagnosis Date  . ACS (acute coronary syndrome) (HCC)    unstable angina in 2017 leading to catheterization/PCI by Dr. Clayborn Bigness  . Arthritis   . Asthma   . CHF (congestive heart failure) ([place])   . COPD (chronic obstructive pulmonary disease) ([place])   . Diabetes mellitus without complication ([place])   . Hepatitis C   . Hypertension     Past Surgical History:  Procedure Laterality Date  . CARDIAC CATHETERIZATION N/A 06/15/2016   Procedure: Left Heart Cath and Coronary Angiography PCI;  Surgeon: Yolonda Kida, MD;  Location: [facility];  Service: Cardiovascular;  Laterality: N/A;    Family History  Problem Relation Age of Onset  . Bladder Cancer Mother   . Peripheral vascular disease Mother   . Lung cancer Father     Social History:  reports that he has been smoking cigarettes.   He has a 40.00 pack-year smoking history. He has never used smokeless tobacco. He reports that he drinks about 0.6 oz of alcohol per week. He reports that he does not use drugs.  Allergies: No Known Allergies  Medications: I have reviewed the patient's current medications.  Results for orders placed or performed during the hospital encounter of 11/24/17 (from the past 48 hour(s))  Glucose, capillary     Status: Abnormal   Collection Time: 11/24/17  4:37 PM  Result Value Ref Range   Glucose-Capillary 168 (H) 65 - 99 mg/dL  Glucose, capillary     Status: Abnormal   Collection Time: 11/24/17  9:11 PM  Result Value Ref Range   Glucose-Capillary 139 (H) 65 - 99 mg/dL   Comment 1 Notify RN    Comment 2 Document in Chart   Heparin level (unfractionated)     Status: Abnormal   Collection Time: 11/24/17 10:35 PM  Result Value Ref Range   Heparin Unfractionated <0.10 (L) 0.30 - 0.70 IU/mL    Comment:        IF HEPARIN RESULTS ARE BELOW EXPECTED VALUES, AND PATIENT DOSAGE HAS BEEN CONFIRMED, SUGGEST FOLLOW UP TESTING OF ANTITHROMBIN III LEVELS. RESULT REPEATED AND VERIFIED Performed at [facility], [address] [place], [place] [address]   Basic metabolic panel     Status: Abnormal   Collection Time: 11/25/17  5:02 AM  Result Value Ref Range   Sodium 142 135 - 145 mmol/L   Potassium 3.8 3.5 - 5.1 mmol/L   Chloride 101 101 - 111 mmol/L  CO2 31 22 - 32 mmol/L   Glucose, Bld 122 (H) 65 - 99 mg/dL   BUN 34 (H) 6 - 20 mg/dL   Creatinine, Ser 0.86 0.61 - 1.24 mg/dL   Calcium 8.5 (L) 8.9 - 10.3 mg/dL   GFR calc non Af Amer >60 >60 mL/min   GFR calc Af Amer >60 >60 mL/min    Comment: (NOTE) The eGFR has been calculated using the CKD EPI equation. This calculation has not been validated in all clinical situations. eGFR's persistently <60 mL/min signify possible Chronic Kidney Disease.    Anion gap 10 5 - 15    Comment: Performed at [facility], [address] [place], [place] [address]  CBC     Status: Abnormal   Collection Time: 11/25/17  5:02 AM  Result Value Ref Range   WBC 20.1 (H) 3.8 - 10.6 K/uL   RBC 3.99 (L) 4.40 - 5.90 MIL/uL   Hemoglobin 11.6 (L) 13.0 - 18.0 g/dL   HCT 36.3 (L) 40.0 - 52.0 %   MCV 91.0 80.0 - 100.0 fL   MCH 29.1 26.0 - 34.0 pg   MCHC 32.0 32.0 - 36.0 g/dL   RDW 14.8 (H) 11.5 - 14.5 %   Platelets 294 150 - 440 K/uL    Comment: Performed at [facility], [address] [place], [place] [address]  Procalcitonin     Status: None   Collection Time: 11/25/17  5:02 AM  Result Value Ref Range   Procalcitonin <0.10 ng/mL    Comment:        Interpretation: PCT (Procalcitonin) <= 0.5 ng/mL: Systemic infection (sepsis) is not likely. Local bacterial infection is possible. (NOTE)       Sepsis PCT Algorithm           Lower Respiratory Tract                                      Infection PCT Algorithm    ----------------------------     ----------------------------         PCT < 0.25 ng/mL                PCT < 0.10 ng/mL         Strongly encourage             Strongly discourage   discontinuation of antibiotics    initiation of antibiotics    ----------------------------     -----------------------------       PCT 0.25 - 0.50 ng/mL            PCT 0.10 - 0.25 ng/mL               OR       >80% decrease in PCT            Discourage initiation of                                            antibiotics      Encourage discontinuation           of antibiotics    ----------------------------     -----------------------------         PCT >= 0.50 ng/mL  PCT 0.26 - 0.50 ng/mL               AND        <80% decrease in PCT             Encourage initiation of                                             antibiotics       Encourage continuation           of antibiotics    ----------------------------     -----------------------------        PCT >= 0.50 ng/mL                  PCT > 0.50 ng/mL                AND         increase in PCT                  Strongly encourage                                      initiation of antibiotics    Strongly encourage escalation           of antibiotics                                     -----------------------------                                           PCT <= 0.25 ng/mL                                                 OR                                        > 80% decrease in PCT                                     Discontinue / Do not initiate                                             antibiotics Performed at [facility], [address] [place], [place] [address]   Lipid panel     Status: Abnormal   Collection Time: 11/25/17  5:02 AM  Result Value Ref Range   Cholesterol 96 0 - 200 mg/dL   Triglycerides 79 <150 mg/dL   HDL 31 (L) >40 mg/dL   Total CHOL/HDL Ratio 3.1 RATIO   VLDL 16 0 - 40 mg/dL   LDL Cholesterol 49 0 - 99 mg/dL  Comment:        Total Cholesterol/HDL:CHD Risk Coronary Heart Disease Risk Table                     Men   Women  1/2 Average Risk   3.4   3.3  Average Risk       5.0   4.4  2 X Average Risk   9.6   7.1  3 X Average Risk  23.4   11.0        Use the calculated Patient Ratio above and the CHD Risk Table to determine the patient's CHD Risk.        ATP III CLASSIFICATION (LDL):  <100     mg/dL   Optimal  100-129  mg/dL   Near or Above                    Optimal  130-159  mg/dL   Borderline  160-189  mg/dL   High  >190     mg/dL   Very High Performed at [facility], [address] [place], [place] [address]   Magnesium     Status: None   Collection Time: 11/25/17  5:02 AM  Result Value Ref Range   Magnesium 1.9 1.7 - 2.4 mg/dL    Comment: Performed at [facility], [address] [place], [place] [address]  Glucose, capillary     Status: Abnormal   Collection Time: 11/25/17  7:38 AM  Result Value Ref Range   Glucose-Capillary 105 (H) 65 - 99 mg/dL  Heparin level  (unfractionated)     Status: None   Collection Time: 11/25/17  7:43 AM  Result Value Ref Range   Heparin Unfractionated 0.52 0.30 - 0.70 IU/mL    Comment:        IF HEPARIN RESULTS ARE BELOW EXPECTED VALUES, AND PATIENT DOSAGE HAS BEEN CONFIRMED, SUGGEST FOLLOW UP TESTING OF ANTITHROMBIN III LEVELS. Performed at [facility], [address] [place], [place] [address]   Glucose, capillary     Status: Abnormal   Collection Time: 11/25/17 11:39 AM  Result Value Ref Range   Glucose-Capillary 163 (H) 65 - 99 mg/dL  Heparin level (unfractionated)     Status: None   Collection Time: 11/25/17  3:19 PM  Result Value Ref Range   Heparin Unfractionated 0.61 0.30 - 0.70 IU/mL    Comment:        IF HEPARIN RESULTS ARE BELOW EXPECTED VALUES, AND PATIENT DOSAGE HAS BEEN CONFIRMED, SUGGEST FOLLOW UP TESTING OF ANTITHROMBIN III LEVELS. Performed at [facility], [address] [place], [place] [address]   Glucose, capillary     Status: Abnormal   Collection Time: 11/25/17  4:01 PM  Result Value Ref Range   Glucose-Capillary 155 (H) 65 - 99 mg/dL  Glucose, capillary     Status: Abnormal   Collection Time: 11/25/17 10:15 PM  Result Value Ref Range   Glucose-Capillary 166 (H) 65 - 99 mg/dL  Procalcitonin     Status: None   Collection Time: 11/26/17  5:22 AM  Result Value Ref Range   Procalcitonin <0.10 ng/mL    Comment:        Interpretation: PCT (Procalcitonin) <= 0.5 ng/mL: Systemic infection (sepsis) is not likely. Local bacterial infection is possible. (NOTE)       Sepsis PCT Algorithm           Lower Respiratory Tract  Infection PCT Algorithm    ----------------------------     ----------------------------         PCT < 0.25 ng/mL                PCT < 0.10 ng/mL         Strongly encourage             Strongly discourage   discontinuation of antibiotics    initiation of antibiotics    ----------------------------      -----------------------------       PCT 0.25 - 0.50 ng/mL            PCT 0.10 - 0.25 ng/mL               OR       >80% decrease in PCT            Discourage initiation of                                            antibiotics      Encourage discontinuation           of antibiotics    ----------------------------     -----------------------------         PCT >= 0.50 ng/mL              PCT 0.26 - 0.50 ng/mL               AND        <80% decrease in PCT             Encourage initiation of                                             antibiotics       Encourage continuation           of antibiotics    ----------------------------     -----------------------------        PCT >= 0.50 ng/mL                  PCT > 0.50 ng/mL               AND         increase in PCT                  Strongly encourage                                      initiation of antibiotics    Strongly encourage escalation           of antibiotics                                     -----------------------------                                           PCT <= 0.25 ng/mL  OR                                        > 80% decrease in PCT                                     Discontinue / Do not initiate                                             antibiotics Performed at [facility], [place], [place] [address]   Heparin level (unfractionated)     Status: Abnormal   Collection Time: 11/26/17  5:22 AM  Result Value Ref Range   Heparin Unfractionated 1.23 (H) 0.30 - 0.70 IU/mL    Comment:        IF HEPARIN RESULTS ARE BELOW EXPECTED VALUES, AND PATIENT DOSAGE HAS BEEN CONFIRMED, SUGGEST FOLLOW UP TESTING OF ANTITHROMBIN III LEVELS. Performed at [facility], [address] [place], [place] [address]   CBC     Status: Abnormal   Collection Time: 11/26/17  5:22 AM  Result Value Ref Range   WBC 18.6 (H) 3.8 - 10.6 K/uL   RBC 4.24  (L) 4.40 - 5.90 MIL/uL   Hemoglobin 12.3 (L) 13.0 - 18.0 g/dL   HCT 38.5 (L) 40.0 - 52.0 %   MCV 90.9 80.0 - 100.0 fL   MCH 29.0 26.0 - 34.0 pg   MCHC 31.9 (L) 32.0 - 36.0 g/dL   RDW 14.6 (H) 11.5 - 14.5 %   Platelets 302 150 - 440 K/uL    Comment: Performed at [facility], [address] [place], [place] [address]  Basic metabolic panel     Status: Abnormal   Collection Time: 11/26/17  5:22 AM  Result Value Ref Range   Sodium 138 135 - 145 mmol/L   Potassium 4.0 3.5 - 5.1 mmol/L   Chloride 97 (L) 101 - 111 mmol/L   CO2 31 22 - 32 mmol/L   Glucose, Bld 155 (H) 65 - 99 mg/dL   BUN 43 (H) 6 - 20 mg/dL   Creatinine, Ser 0.94 0.61 - 1.24 mg/dL   Calcium 8.3 (L) 8.9 - 10.3 mg/dL   GFR calc non Af Amer >60 >60 mL/min   GFR calc Af Amer >60 >60 mL/min    Comment: (NOTE) The eGFR has been calculated using the CKD EPI equation. This calculation has not been validated in all clinical situations. eGFR's persistently <60 mL/min signify possible Chronic Kidney Disease.    Anion gap 10 5 - 15    Comment: Performed at [facility], [address] [place], [place] [address]  Glucose, capillary     Status: Abnormal   Collection Time: 11/26/17  7:37 AM  Result Value Ref Range   Glucose-Capillary 146 (H) 65 - 99 mg/dL  Glucose, capillary     Status: Abnormal   Collection Time: 11/26/17 12:11 PM  Result Value Ref Range   Glucose-Capillary 167 (H) 65 - 99 mg/dL  Heparin level (unfractionated)     Status: Abnormal   Collection Time: 11/26/17  3:10 PM  Result Value Ref Range   Heparin Unfractionated 0.87 (H) 0.30 -  0.70 IU/mL    Comment:        IF HEPARIN RESULTS ARE BELOW EXPECTED VALUES, AND PATIENT DOSAGE HAS BEEN CONFIRMED, SUGGEST FOLLOW UP TESTING OF ANTITHROMBIN III LEVELS. Performed at [facility], [address] [place], [place] [address]   Glucose, capillary     Status: Abnormal   Collection Time: 11/26/17  4:03 PM  Result Value Ref Range    Glucose-Capillary 160 (H) 65 - 99 mg/dL    Dg Chest Port 1 View  Result Date: 11/25/2017 CLINICAL DATA:  Respiratory failure, history asthma, CHF, COPD, diabetes mellitus, hypertension EXAM: PORTABLE CHEST 1 VIEW COMPARISON:  Portable exam 0806 hours compared to 11/24/2017 FINDINGS: Upper normal heart size. Mediastinal contours and pulmonary vascularity normal. Interstitial prominence particularly in RIGHT lung appears improved. No segmental consolidation, pleural effusion or pneumothorax. Bones demineralized. IMPRESSION: Improved interstitial prominence question improved mild pulmonary edema. Electronically Signed   By: Lavonia Dana M.D.   On: 11/25/2017 10:10    Review of Systems  Unable to perform ROS: Intubated   Blood pressure 126/72, pulse 70, temperature (!) 97.3 F (36.3 C), resp. rate 20, height 5' 4"  (1.626 m), weight 192 lb 14.4 oz (87.5 kg), SpO2 95 %. Physical Exam  Nursing note and vitals reviewed. Constitutional: He is oriented to person, place, and time. He appears well-developed and well-nourished.  HENT:  Head: Normocephalic and atraumatic.  Eyes: Pupils are equal, round, and reactive to light. Conjunctivae and EOM are normal.  Neck: Normal range of motion. Neck supple.  Cardiovascular: Normal rate, regular rhythm and normal heart sounds.  Respiratory: Effort normal and breath sounds normal.  GI: Soft. Bowel sounds are normal.  Musculoskeletal: Normal range of motion.  Neurological: He is alert and oriented to person, place, and time. He has normal reflexes.  Skin: Skin is warm and dry.  Psychiatric: He has a normal mood and affect. His behavior is normal.    Assessment/Plan: Shortness of breath COPD exacerbation Hypoxemia Multivessel coronary disease Ischemic cardiomyopathy Respiratory distress Congestive heart failure systolic Diabetes Hypertension Hepatitis C GERD Smoking . Plan Agree with respiratory treatment and support Continue inhalers steroids  supplemental oxygen Continue critical care pulmonary input Agree with diuretic therapy for mild heart failure Demand ischemia related to elevated troponins Continue diabetes management control Agree with hypertension management Continue statin and Zet[place] Advised patient to refrain from tobacco abuse Recommend pulmonary rehab Continue aggressive medical therapy for multivessel coronary disease  Charley Miske D Khaliyah [employer] 11/26/2017, 4:04 PM

## 2017-11-26 NOTE — Progress Notes (Signed)
ANTICOAGULATION CONSULT NOTE - Follow Up Consult  Pharmacy Consult for heparin drip Indication: NSTEMI  No Known Allergies  Patient Measurements: Height: 5\' 4"  (162.6 cm) Weight: 192 lb 14.4 oz (87.5 kg) IBW/kg (Calculated) : 59.2 Heparin Dosing Weight: 81kg  Vital Signs: Temp: 97.3 F (36.3 C) (03/24 0847) BP: 126/72 (03/24 1400) Pulse Rate: 70 (03/24 1400)  Labs: Recent Labs    11/24/17 0621 11/24/17 0749 11/24/17 1407  11/25/17 0502  11/25/17 1519 11/26/17 0522 11/26/17 1510  HGB 11.5*  --   --   --  11.6*  --   --  12.3*  --   HCT 35.8*  --   --   --  36.3*  --   --  38.5*  --   PLT 267  --   --   --  294  --   --  302  --   APTT  --  26  --   --   --   --   --   --   --   LABPROT  --  13.4  --   --   --   --   --   --   --   INR  --  1.03  --   --   --   --   --   --   --   HEPARINUNFRC  --   --  0.11*   < >  --    < > 0.61 1.23* 0.87*  CREATININE 0.76  --   --   --  0.86  --   --  0.94  --   TROPONINI 2.09* 2.19* 1.75*  --   --   --   --   --   --    < > = values in this interval not displayed.    Estimated Creatinine Clearance: 79.2 mL/min (by C-G formula based on SCr of 0.94 mg/dL).  Medical History: Past Medical History:  Diagnosis Date  . ACS (acute coronary syndrome) (HCC)    unstable angina in 2017 leading to catheterization/PCI by Dr. Juliann Paresallwood  . Arthritis   . Asthma   . CHF (congestive heart failure) (HCC)   . COPD (chronic obstructive pulmonary disease) (HCC)   . Diabetes mellitus without complication (HCC)   . Hepatitis C   . Hypertension     Medications:  No anticoagulation in PTA meds  Assessment: Trop 2.09  Goal of Therapy:  Heparin level 0.3-0.7 units/ml Monitor platelets by anticoagulation protocol: Yes   Plan:  3/22 @ 1407, HL=0.11, subtherapeutic. Will bolus 2400 units followed by an increase in heparin drip to 1250 units/hr. Recheck HL in 6 hours.   3/22  2230 heparin level <0.1. 2400 unit bolus and increase rate to 1550  units/hr. Recheck in 8 hours.   3/23 at 07:43  HL = 0.52.  Will recheck HL today at 14:00.  3/23 15:19 HL therapeutic x 2. Continue current rate. Will recheck HL and CBC daily per protocol.  03/24 AM heparin level 1.23. Hold drip x1 hour. Restart at 1300 units/hr. Recheck 6 hours after restart.  3/24 1510 HL supratherapeutic x 1. Decrease rate to 1150 units/hr. Will recheck HL in 6 hours.  Carola Frostathan A Armani Gawlik, [facility]incon Medical CenterRPH  11/26/2017,3:43 PM

## 2017-11-27 ENCOUNTER — Telehealth: Payer: Self-pay

## 2017-11-27 DIAGNOSIS — J449 Chronic obstructive pulmonary disease, unspecified: Secondary | ICD-10-CM

## 2017-11-27 DIAGNOSIS — J9601 Acute respiratory failure with hypoxia: Secondary | ICD-10-CM

## 2017-11-27 DIAGNOSIS — J441 Chronic obstructive pulmonary disease with (acute) exacerbation: Principal | ICD-10-CM

## 2017-11-27 DIAGNOSIS — J9602 Acute respiratory failure with hypercapnia: Secondary | ICD-10-CM

## 2017-11-27 DIAGNOSIS — I214 Non-ST elevation (NSTEMI) myocardial infarction: Secondary | ICD-10-CM

## 2017-11-27 LAB — GLUCOSE, CAPILLARY
GLUCOSE-CAPILLARY: 120 mg/dL — AB (ref 65–99)
GLUCOSE-CAPILLARY: 186 mg/dL — AB (ref 65–99)
Glucose-Capillary: 160 mg/dL — ABNORMAL HIGH (ref 65–99)
Glucose-Capillary: 160 mg/dL — ABNORMAL HIGH (ref 65–99)

## 2017-11-27 LAB — CBC
HEMATOCRIT: 39.1 % — AB (ref 40.0–52.0)
Hemoglobin: 12.7 g/dL — ABNORMAL LOW (ref 13.0–18.0)
MCH: 29.2 pg (ref 26.0–34.0)
MCHC: 32.4 g/dL (ref 32.0–36.0)
MCV: 90.1 fL (ref 80.0–100.0)
PLATELETS: 310 10*3/uL (ref 150–440)
RBC: 4.34 MIL/uL — ABNORMAL LOW (ref 4.40–5.90)
RDW: 14.3 % (ref 11.5–14.5)
WBC: 22.9 10*3/uL — AB (ref 3.8–10.6)

## 2017-11-27 LAB — PHOSPHORUS: PHOSPHORUS: 3.3 mg/dL (ref 2.5–4.6)

## 2017-11-27 LAB — MAGNESIUM: MAGNESIUM: 2.2 mg/dL (ref 1.7–2.4)

## 2017-11-27 LAB — HEPARIN LEVEL (UNFRACTIONATED): Heparin Unfractionated: 0.37 IU/mL (ref 0.30–0.70)

## 2017-11-27 MED ORDER — METHYLPREDNISOLONE SODIUM SUCC 40 MG IJ SOLR
40.0000 mg | Freq: Three times a day (TID) | INTRAMUSCULAR | Status: DC
  Administered 2017-11-27 – 2017-11-28 (×3): 40 mg via INTRAVENOUS
  Filled 2017-11-27 (×3): qty 1

## 2017-11-27 MED ORDER — ENOXAPARIN SODIUM 40 MG/0.4ML ~~LOC~~ SOLN
40.0000 mg | SUBCUTANEOUS | Status: DC
  Administered 2017-11-27 – 2017-11-28 (×2): 40 mg via SUBCUTANEOUS
  Filled 2017-11-27 (×2): qty 0.4

## 2017-11-27 NOTE — Care Management Note (Signed)
Case Management Note  Patient Details  Name: SPARROW SANZO MRN: [id number] Date of Birth: October 19, 1955  Subjective/Objective:                   RNCM met with patient to discuss transition of care. He denies problems paying for mediations as he has [place] Medicaid. He states his mother passed away about two months ago and she helped pay mortgage ($700/month).  He has chronic O2 at home.  He states he is independent with mobility. He is current with PCP and cardiologist. He has history of [facility] but appears more appropriate for cardio rehab.  Per note patient still smokes.   Action/Plan: Advised patient to reach out to social services/medicaid officer for assistance with housing.  He agreed.  RNCM will follow.   Expected Discharge Date:                  Expected Discharge Plan:     In-House Referral:     Discharge planning Services  CM Consult  Post Acute Care Choice:    Choice offered to:  Patient  DME Arranged:    DME Agency:     HH Arranged:    [place] Agency:     Status of Service:  Completed, signed off  If discussed at [employer] of Stay Meetings, dates discussed:    Additional Comments:  Marshell Garfinkel, RN 11/27/2017, 11:59 AM

## 2017-11-27 NOTE — Telephone Encounter (Signed)
Visited Kyle Rich today to see how he is feeling. Informed him that he needs a doctors permision to return to [facility]. He states that he had a heart attack and does not know when he will be back. He wants to return to [facility] but may need Cardiac Rehab. Patient verbalizes understanding.

## 2017-11-27 NOTE — Progress Notes (Addendum)
PULMONARY / CRITICAL CARE MEDICINE   Name: Kyle Rich MRN: [id number] DOB: 1955-09-26    ADMISSION DATE:  11/24/2017  HISTORY OF PRESENT ILLNESS:   Mr. Kyle RabonJamesPageis a62 y.o.malewith a known history of CAD, COPD presents with shortness of breath going on for a couple days. The patient is currently on BiPAP in the ER 40% FiO2 and using accessory muscles to breathe. He complains of shortness of breath, fever and chills, weight gain, coughing up yellow phlegm and wheezing. He feels like he is going to die. He stated he had a couple stents a few months ago but I am not seeing any record of that in the chart. Last cardiac catheterization we saw is in 2017. The patient does complain of chest pain but is not the best historian and cannot describe it. States it is severe In the center of his chest. Complains of some abdominal pain. No nausea or vomiting or diaphoresis. Hospitalist services were contacted for further evaluation. In the ER his troponin was 2.    REVIEW OF SYSTEMS:  Persistent dysnoea   SUBJECTIVE:  Patient reports he feels much better and less short of breath.  Remains chest pain free.  VITAL SIGNS: BP (!) 155/87   Pulse 68   Temp 98.4 F (36.9 C) (Oral)   Resp 15   Ht 5\' 4"  (1.626 m)   Wt 192 lb 14.4 oz (87.5 kg)   SpO2 96%   BMI 33.11 kg/m   HEMODYNAMICS:   Nil VENTILATOR SETTINGS: FiO2 (%):  [35 %] 35 %  INTAKE / OUTPUT: I/O last 3 completed shifts: In: 426.6 [I.V.:426.6] Out: 3675 [Urine:3675]  PHYSICAL EXAMINATION: General:  Comfortable in bed on HFO Neuro:  AAO x3, no deficits HEENT:  PERRL Cardiovascular:  RRR, no murmurs Lungs: good air entry bilateral, few wheezes Abdomen:  Soft, audible BS Musculoskeletal:  No deformities Skin:  Warm and dry, no oedema  LABS:  BMET Recent Labs  Lab 11/24/17 0621 11/25/17 0502 11/26/17 0522  NA 139 142 138  K 3.8 3.8 4.0  CL 103 101 97*  CO2 28 31 31   BUN 25* 34* 43*  CREATININE 0.76  0.86 0.94  GLUCOSE 159* 122* 155*    Electrolytes Recent Labs  Lab 11/24/17 0621 11/25/17 0502 11/26/17 0522 11/27/17 0500  CALCIUM 8.3* 8.5* 8.3*  --   MG 2.0 1.9  --  2.2  PHOS  --   --   --  3.3    CBC Recent Labs  Lab 11/25/17 0502 11/26/17 0522 11/27/17 0500  WBC 20.1* 18.6* 22.9*  HGB 11.6* 12.3* 12.7*  HCT 36.3* 38.5* 39.1*  PLT 294 302 310    Coag's Recent Labs  Lab 11/24/17 0749  APTT 26  INR 1.03    Sepsis Markers Recent Labs  Lab 11/24/17 0749 11/25/17 0502 11/26/17 0522  PROCALCITON <0.10 <0.10 <0.10    ABG Recent Labs  Lab 11/24/17 0621  PHART 7.30*  PCO2ART 69*  PO2ART 153*    Liver Enzymes Recent Labs  Lab 11/24/17 0621  AST 39  ALT 29  ALKPHOS 69  BILITOT 0.6  ALBUMIN 3.7    Cardiac Enzymes Recent Labs  Lab 11/24/17 0621 11/24/17 0749 11/24/17 1407  TROPONINI 2.09* 2.19* 1.75*    Glucose Recent Labs  Lab 11/26/17 0737 11/26/17 1211 11/26/17 1603 11/26/17 2222 11/27/17 0748 11/27/17 1139  GLUCAP 146* 167* 160* 152* 120* 160*    Imaging No results found.   STUDIES:  3/22 ECHO- results pending  CULTURES: Nil  ANTIBIOTICS: 3/22 Azithromycin, Rocephin  SIGNIFICANT EVENTS: Nil  LINES/TUBES: Peripheral  DISCUSSION: This 62 year old gentleman with severe COPD and still smoking was admitted with NSTEMI and in pulmonary oedema.  We are also treating him for acute exacerbation of COPD.  ASSESSMENT / PLAN:  1. Acute on chronic hypoxic and hypercarbic Respiratory Failure. He is much improved today on HFO. 2. Acute NSTEMI with acute pulmonary oedema- radiographically and clinically better from cardiac stand point. 3. Acute exacerbation of COPD with probable acute bronchitis- less wheezing today 4. Hx of Type 2 diabetes mellitus 5. Hx of hyperlipidemia 6. Active ongoing nicotine dependency 7. Leukocytosis is likely from steroid therapy.  Plan: 1. Continue on Steroids, continue, inhaled steroids  and nebulizer 2. Continue on beta blocker, ASA; will D/C Heparin drip today.  Switch to DVT prophylaxis dose of Lovenox . ECHO reults pending. 3. Continue on empirical ABX, bronchodilators, steroids 4. Target glucose management 5. Anti-lipid therapy 6. Smoking cessation couseling done.  7. Will add Mucinex  Disp: out of bed; will re- evaluate for transfer out of ICU today  FAMILY  - Updates: when available  I have dedicated a total of 35 minutes in critical care time minus all appropriate exclusion.  The patient requested transfer to [facility].  I have spoken to Dr. Clifton Custard Pulsiphel who has accepted the patient.  However, there are no beds so patient was placed on waiting list.  I have re- evaluated the patient this afternoon and ghe can be transferred to telemetry.  [name], MD Pulmonary and Critical Care Medicine [facility] Pager: [phone]  11/27/2017, 1:34 PM

## 2017-11-27 NOTE — Progress Notes (Signed)
[facility] [place] [facility]   PATIENT NAME: Kyle Rich    MR#:  [id number]  DATE OF BIRTH:  07-09-1956  SUBJECTIVE:   Shortness of breath much improved since admission.  Patient denies any chest pain.  Plan to DC heparin drip today.  No other acute events overnight.  REVIEW OF SYSTEMS:    Review of Systems  Constitutional: Negative for chills and fever.  HENT: Negative for congestion and tinnitus.   Eyes: Negative for blurred vision and double vision.  Respiratory: Negative for cough, shortness of breath and wheezing.   Cardiovascular: Negative for chest pain, orthopnea and PND.  Gastrointestinal: Negative for abdominal pain, diarrhea, nausea and vomiting.  Genitourinary: Negative for dysuria and hematuria.  Neurological: Negative for dizziness, sensory change and focal weakness.  All other systems reviewed and are negative.   Nutrition: Heart Healthy Tolerating Diet: Yes Tolerating PT: Await Eval.   DRUG ALLERGIES:  No Known Allergies  VITALS:  Blood pressure (!) 151/62, pulse 69, temperature (!) 96.4 F (35.8 C), temperature source Axillary, resp. rate 17, height 5\' 4"  (1.626 m), weight 87.5 kg (192 lb 14.4 oz), SpO2 96 %.  PHYSICAL EXAMINATION:   Physical Exam  GENERAL:  62 y.o.-year-old patient lying in bed in mild respiratory distress on Hiflo [place].   EYES: Pupils equal, round, reactive to light and accommodation. No scleral icterus. Extraocular muscles intact.  HEENT: Head atraumatic, normocephalic. Oropharynx and nasopharynx clear.  NECK:  Supple, no jugular venous distention. No thyroid enlargement, no tenderness.  LUNGS: Good air entry bilaterally, diffuse inspiratory and expiratory wheezing.  No rhonchi, rales.  No use of accessory muscles. CARDIOVASCULAR: S1, S2 normal. No murmurs, rubs, or gallops.  ABDOMEN: Soft, nontender, nondistended. Bowel sounds present. No organomegaly or mass.  EXTREMITIES: No cyanosis, clubbing or edema b/l.     NEUROLOGIC: Cranial nerves II through XII are intact. No focal Motor or sensory deficits b/l.   PSYCHIATRIC: The patient is alert and oriented x 3.  SKIN: No obvious rash, lesion, or ulcer.    LABORATORY PANEL:   CBC Recent Labs  Lab 11/27/17 0500  WBC 22.9*  HGB 12.7*  HCT 39.1*  PLT 310   ------------------------------------------------------------------------------------------------------------------  Chemistries  Recent Labs  Lab 11/24/17 0621  11/26/17 0522 11/27/17 0500  NA 139   < > 138  --   K 3.8   < > 4.0  --   CL 103   < > 97*  --   CO2 28   < > 31  --   GLUCOSE 159*   < > 155*  --   BUN 25*   < > 43*  --   CREATININE 0.76   < > 0.94  --   CALCIUM 8.3*   < > 8.3*  --   MG 2.0   < >  --  2.2  AST 39  --   --   --   ALT 29  --   --   --   ALKPHOS 69  --   --   --   BILITOT 0.6  --   --   --    < > = values in this interval not displayed.   ------------------------------------------------------------------------------------------------------------------  Cardiac Enzymes Recent Labs  Lab 11/24/17 1407  TROPONINI 1.75*   ------------------------------------------------------------------------------------------------------------------  RADIOLOGY:  No results found.   ASSESSMENT AND PLAN:   62 year old male with past medical history of hypertension, hepatitis C, diabetes, COPD, history of CHF who presented  to the hospital with chest pain and shortness of breath and noted to be in acute on chronic respiratory failure with hypoxia.  1.  Acute on chronic respiratory failure with hypoxia-secondary to COPD exacerbation. - pt. Is off Bipap now and now on Hiflo [place] and will cont. To wean as tolerated.  -Continue IV steroids, scheduled duo nebs, Pulmicort nebs.  Continue empiric ceftriaxone, Zithromax. -Patient will need to be assessed for home oxygen prior to discharge.  2.  COPD exacerbation-this was the cause of patient's worsening respiratory failure  with hypoxia. -Continue treatment as mentioned above.  3.  Elevated troponin- etiology unclear, supply demand ischemia versus possible non-ST elevation MI.  Patient denies any acute chest pain. - Discussed with cardiology yesterday and patient has had a previous cardiac catheterization and has multivessel disease and would benefit from bypass but he is not a good candidate for surgery given his bad COPD and ongoing tobacco abuse.  Continue medical management for now. -No chest pain presently.  DC heparin drip.  Continue aspirin atorvastatin, Plavix, metoprolol.    4.  GERD-continue Protonix.    5.  Diabetes type 2 without complication-continue sliding scale insulin. - BS stable.   6.  Depression/anxiety-continue Lexapro/Xanax.  Possible transfer to floor later today.  All the records are reviewed and case discussed with Care Management/Social Worker. Management plans discussed with the patient, family and they are in agreement.  CODE STATUS: Full code  DVT Prophylaxis: Lovenox  TOTAL TIME TAKING CARE OF THIS PATIENT: 30 minutes.   POSSIBLE D/C IN 2-3 DAYS, DEPENDING ON CLINICAL CONDITION.   Houston Siren M.D on 11/27/2017 at 3:55 PM  Between 7am to 6pm - Pager - [phone]  After 6pm go to [email or web address] - Social research officer, government  [facility] [place] [facility]  Office  [phone]  CC: Primary care physician; Rayetta Humphrey, MD

## 2017-11-27 NOTE — Progress Notes (Addendum)
ANTICOAGULATION CONSULT NOTE - Follow Up Consult  Pharmacy Consult for heparin drip Indication: NSTEMI  No Known Allergies  Patient Measurements: Height: 5\' 4"  (162.6 cm) Weight: 192 lb 14.4 oz (87.5 kg) IBW/kg (Calculated) : 59.2 Heparin Dosing Weight: 81kg  Vital Signs: Temp: 96.7 F (35.9 C) (03/25 0100) Temp Source: Axillary (03/25 0100) BP: 151/66 (03/25 0200) Pulse Rate: 64 (03/25 0200)  Labs: Recent Labs    11/24/17 0621 11/24/17 0749 11/24/17 1407  11/25/17 0502  11/26/17 0522 11/26/17 1510 11/26/17 2223 11/27/17 0500  HGB 11.5*  --   --   --  11.6*  --  12.3*  --   --  12.7*  HCT 35.8*  --   --   --  36.3*  --  38.5*  --   --  39.1*  PLT 267  --   --   --  294  --  302  --   --  310  APTT  --  26  --   --   --   --   --   --   --   --   LABPROT  --  13.4  --   --   --   --   --   --   --   --   INR  --  1.03  --   --   --   --   --   --   --   --   HEPARINUNFRC  --   --  0.11*   < >  --    < > 1.23* 0.87* 0.48 0.37  CREATININE 0.76  --   --   --  0.86  --  0.94  --   --   --   TROPONINI 2.09* 2.19* 1.75*  --   --   --   --   --   --   --    < > = values in this interval not displayed.    Estimated Creatinine Clearance: 79.2 mL/min (by C-G formula based on SCr of 0.94 mg/dL).  Medical History: Past Medical History:  Diagnosis Date  . ACS (acute coronary syndrome) (HCC)    unstable angina in 2017 leading to catheterization/PCI by Dr. Juliann Pares  . Arthritis   . Asthma   . CHF (congestive heart failure) (HCC)   . COPD (chronic obstructive pulmonary disease) (HCC)   . Diabetes mellitus without complication (HCC)   . Hepatitis C   . Hypertension     Medications:  No anticoagulation in PTA meds  Assessment: Trop 2.09  Goal of Therapy:  Heparin level 0.3-0.7 units/ml Monitor platelets by anticoagulation protocol: Yes   Plan:  3/22 @ 1407, HL=0.11, subtherapeutic. Will bolus 2400 units followed by an increase in heparin drip to 1250 units/hr.  Recheck HL in 6 hours.   3/22  2230 heparin level <0.1. 2400 unit bolus and increase rate to 1550 units/hr. Recheck in 8 hours.   3/23 at 07:43  HL = 0.52.  Will recheck HL today at 14:00.  3/23 15:19 HL therapeutic x 2. Continue current rate. Will recheck HL and CBC daily per protocol.  03/24 AM heparin level 1.23. Hold drip x1 hour. Restart at 1300 units/hr. Recheck 6 hours after restart.  3/24 1510 HL supratherapeutic x 1. Decrease rate to 1150 units/hr. Will recheck HL in 6 hours.  3/24 PM heparin level 0.48. Continue current regimen. Recheck with AM labs to confirm.  3/25 AM heparin level 0.37. Continue  current regimen. Recheck heparin level and CBC with tomorrow AM labs.  3/26 AM heparin level 0.15. Drip is off. Pt now on Lovenox.  Rodneisha Bonnet S, RPH  11/27/2017,5:41 AM

## 2017-11-28 ENCOUNTER — Inpatient Hospital Stay: Payer: Medicaid Other

## 2017-11-28 LAB — HEPARIN LEVEL (UNFRACTIONATED): HEPARIN UNFRACTIONATED: 0.15 [IU]/mL — AB (ref 0.30–0.70)

## 2017-11-28 LAB — CBC
HCT: 40.1 % (ref 40.0–52.0)
HEMOGLOBIN: 12.8 g/dL — AB (ref 13.0–18.0)
MCH: 28.5 pg (ref 26.0–34.0)
MCHC: 31.9 g/dL — ABNORMAL LOW (ref 32.0–36.0)
MCV: 89.5 fL (ref 80.0–100.0)
Platelets: 344 10*3/uL (ref 150–440)
RBC: 4.48 MIL/uL (ref 4.40–5.90)
RDW: 14.3 % (ref 11.5–14.5)
WBC: 25.1 10*3/uL — ABNORMAL HIGH (ref 3.8–10.6)

## 2017-11-28 LAB — GLUCOSE, CAPILLARY
GLUCOSE-CAPILLARY: 119 mg/dL — AB (ref 65–99)
GLUCOSE-CAPILLARY: 236 mg/dL — AB (ref 65–99)
GLUCOSE-CAPILLARY: 184 mg/dL — AB (ref 65–99)
Glucose-Capillary: 132 mg/dL — ABNORMAL HIGH (ref 65–99)

## 2017-11-28 MED ORDER — LISINOPRIL 5 MG PO TABS
5.0000 mg | ORAL_TABLET | Freq: Every day | ORAL | Status: DC
  Administered 2017-11-28 – 2017-11-29 (×2): 5 mg via ORAL
  Filled 2017-11-28 (×2): qty 1

## 2017-11-28 MED ORDER — METHYLPREDNISOLONE SODIUM SUCC 40 MG IJ SOLR
40.0000 mg | Freq: Two times a day (BID) | INTRAMUSCULAR | Status: DC
Start: 2017-11-28 — End: 2017-11-29
  Administered 2017-11-28 – 2017-11-29 (×2): 40 mg via INTRAVENOUS
  Filled 2017-11-28 (×2): qty 1

## 2017-11-28 MED ORDER — SODIUM CHLORIDE 0.9% FLUSH
3.0000 mL | INTRAVENOUS | Status: DC | PRN
  Administered 2017-11-28 – 2017-11-29 (×2): 3 mL via INTRAVENOUS
  Filled 2017-11-28: qty 3

## 2017-11-28 NOTE — Care Management (Signed)
Patient transferred out of icu last night. Was on hfnc but at present, has been  transitioned to nasal cannula. His PCO2 on bipap in icu was 69.  Patient appears to meet the criteria for non invasive ventilator.  Spoke with [facility] representative and informed that this has been discussed as an outpatient.  Patient is in agreement to pursue this. Morrie Sheld[facility]Ashley [facility] will bring the order for signature 3.27.19.

## 2017-11-28 NOTE — Progress Notes (Signed)
[facility] -  at [facility]   PATIENT NAME: Kyle Rich    MR#:  [id number]  DATE OF BIRTH:  [birth year]  SUBJECTIVE:   No Chest Pain.  Shortness of breath improved overall. Pt. Using CPAP/Bipap to sleep with at night and tolerating it.  No other acute events overnight.   REVIEW OF SYSTEMS:    Review of Systems  Constitutional: Negative for chills and fever.  HENT: Negative for congestion and tinnitus.   Eyes: Negative for blurred vision and double vision.  Respiratory: Negative for cough, shortness of breath and wheezing.   Cardiovascular: Negative for chest pain, orthopnea and PND.  Gastrointestinal: Negative for abdominal pain, diarrhea, nausea and vomiting.  Genitourinary: Negative for dysuria and hematuria.  Neurological: Negative for dizziness, sensory change and focal weakness.  All other systems reviewed and are negative.   Nutrition: Heart Healthy Tolerating Diet: Yes Tolerating PT: Eval noted.   DRUG ALLERGIES:  No Known Allergies  VITALS:  Blood pressure (!) 155/65, pulse 69, temperature 98.7 F (37.1 C), resp. rate 19, height 5\' 4"  (1.626 m), weight 84.3 kg (185 lb 12.8 oz), SpO2 100 %.  PHYSICAL EXAMINATION:   Physical Exam  GENERAL:  62 y.o.-year-old patient lying in bed in NAD.   EYES: Pupils equal, round, reactive to light and accommodation. No scleral icterus. Extraocular muscles intact.  HEENT: Head atraumatic, normocephalic. Oropharynx and nasopharynx clear.  NECK:  Supple, no jugular venous distention. No thyroid enlargement, no tenderness.  LUNGS: Good air entry bilaterally, end-exp. Wheezing b/l.  No rhonchi, rales.  No use of accessory muscles. CARDIOVASCULAR: S1, S2 normal. No murmurs, rubs, or gallops.  ABDOMEN: Soft, nontender, nondistended. Bowel sounds present. No organomegaly or mass.  EXTREMITIES: No cyanosis, clubbing or edema b/l.    NEUROLOGIC: Cranial nerves II through XII are intact. No focal Motor or sensory  deficits b/l.   PSYCHIATRIC: The patient is alert and oriented x 3.  SKIN: No obvious rash, lesion, or ulcer.    LABORATORY PANEL:   CBC Recent Labs  Lab 11/28/17 0530  WBC 25.1*  HGB 12.8*  HCT 40.1  PLT 344   ------------------------------------------------------------------------------------------------------------------  Chemistries  Recent Labs  Lab 11/24/17 0621  11/26/17 0522 11/27/17 0500  NA 139   < > 138  --   K 3.8   < > 4.0  --   CL 103   < > 97*  --   CO2 28   < > 31  --   GLUCOSE 159*   < > 155*  --   BUN 25*   < > 43*  --   CREATININE 0.76   < > 0.94  --   CALCIUM 8.3*   < > 8.3*  --   MG 2.0   < >  --  2.2  AST 39  --   --   --   ALT 29  --   --   --   ALKPHOS 69  --   --   --   BILITOT 0.6  --   --   --    < > = values in this interval not displayed.   ------------------------------------------------------------------------------------------------------------------  Cardiac Enzymes Recent Labs  Lab 11/24/17 1407  TROPONINI 1.75*   ------------------------------------------------------------------------------------------------------------------  RADIOLOGY:  Dg Chest Port 1 View  Result Date: 11/28/2017 CLINICAL DATA:  CHF, shortness of breath, weakness. History of asthma-COPD, current smoker, previous MI. EXAM: PORTABLE CHEST 1 VIEW COMPARISON:  Portable chest x-ray of November 25, 2017 FINDINGS: The lungs are well-expanded. There is no focal infiltrate. There is no pleural effusion. The heart and pulmonary vascularity are normal. The mediastinum is normal in width. The trachea is midline. There is calcification in the wall of the aortic arch. IMPRESSION: COPD. No pneumonia, CHF, nor other acute cardiopulmonary abnormality. Thoracic aortic atherosclerosis. Electronically Signed   By: David  [place] M.D.   On: 11/28/2017 09:15     ASSESSMENT AND PLAN:   62 year old male with past medical history of hypertension, hepatitis C, diabetes, COPD, history  of CHF who presented to the hospital with chest pain and shortness of breath and noted to be in acute on chronic respiratory failure with hypoxia.  1.  Acute on chronic respiratory failure with hypoxia-secondary to COPD exacerbation. - off Bipap and now off Hiflo [place] and 3 L [place].   -Continue IV steroids but will taper, scheduled duo nebs, Pulmicort nebs.  Continue empiric ceftriaxone, Zithromax. -Patient will need to be assessed for home oxygen prior to discharge.  2.  COPD exacerbation-this was the cause of patient's worsening respiratory failure with hypoxia. -Continue treatment as mentioned above. - Patient's chronic respiratory failure due to copd is life threatening.  Previous ABGs on bipap have documentation of  PC02 of 69.  Patient would benefit from non-invasive ventilator.  Without this therapy, the patient is at high risk of ending up with worsening symptoms, worsened respiratory failure, need for ER visits and/or recurrent hospitalizations.  Bipap has proven to be ineffective.  Patient would benefit from home ventilator  3.  Elevated troponin- etiology unclear, supply demand ischemia versus possible non-ST elevation MI.  Patient denies any acute chest pain. - Discussed with cardiology and patient has had a previous cardiac catheterization and has multivessel disease and would benefit from bypass but he is not a good candidate for surgery given his bad COPD and ongoing tobacco abuse.  Continue medical management for now. -Continue aspirin atorvastatin, Plavix, metoprolol and refer to cardiology as outpatient.   4.  GERD-continue Protonix.   5.  Diabetes type 2 without complication-continue sliding scale insulin. - BS stable.   6.  Depression/anxiety-continue Lexapro/Xanax.  Discharge home with home health services in the next 1-2 days.  All the records are reviewed and case discussed with Care Management/Social Worker. Management plans discussed with the patient, family and they are  in agreement.  CODE STATUS: Full code  DVT Prophylaxis: Lovenox  TOTAL TIME TAKING CARE OF THIS PATIENT: 30 minutes.   POSSIBLE D/C IN 1-2 DAYS, DEPENDING ON CLINICAL CONDITION.   [place] Siren M.D on 11/28/2017 at 4:05 PM  Between 7am to 6pm - Pager - [phone]  After 6pm go to [email or web address] - Social research officer, government  [facility] [place] [facility]  Office  [phone]  CC: Primary care physician; Rayetta Humphrey, MD

## 2017-11-28 NOTE — Progress Notes (Signed)
Patient transferred from the ICU on a HLNC. On admission to the unit he was A&O X4, denied being in pain, and NSR on the monitor. Patient was oriented to the unit and assisted as needed. Patient was later placed on BiPAP by respiratory therapist until 0600 when he was placed back on HFNC. Patient has no acute event overnight.

## 2017-11-28 NOTE — Plan of Care (Signed)
  Problem: Clinical Measurements: Goal: Ability to maintain clinical measurements within normal limits will improve Outcome: Not Progressing Note:  BUN level is elevated today at 43. Will continue to monitor renal function. Jari FavreSteven M Edward Plainfieldmhoff

## 2017-11-29 LAB — GLUCOSE, CAPILLARY
GLUCOSE-CAPILLARY: 159 mg/dL — AB (ref 65–99)
Glucose-Capillary: 200 mg/dL — ABNORMAL HIGH (ref 65–99)

## 2017-11-29 MED ORDER — METOPROLOL TARTRATE 25 MG PO TABS: 25.0000 mg | ORAL_TABLET | Freq: Two times a day (BID) | ORAL | Status: DC

## 2017-11-29 MED ORDER — ATORVASTATIN CALCIUM 40 MG PO TABS: 40.0000 mg | ORAL_TABLET | Freq: Every day | ORAL | 1 refills | Status: DC

## 2017-11-29 MED ORDER — PREDNISONE 10 MG PO TABS: ORAL_TABLET | ORAL | 0 refills | Status: DC

## 2017-11-29 MED ORDER — IPRATROPIUM-ALBUTEROL 0.5-2.5 (3) MG/3ML IN SOLN: 3.0000 mL | Freq: Four times a day (QID) | RESPIRATORY_TRACT | Status: DC

## 2017-11-29 MED ORDER — LISINOPRIL 5 MG PO TABS: 5.0000 mg | ORAL_TABLET | Freq: Every day | ORAL | 1 refills | Status: DC

## 2017-11-29 NOTE — Plan of Care (Signed)
  Problem: Clinical Measurements: Goal: Respiratory complications will improve Outcome: Progressing   Problem: Respiratory: Goal: Ability to maintain a clear airway will improve Outcome: Progressing   

## 2017-11-29 NOTE — Progress Notes (Signed)
Kyle SkyeJames D Rich to be D/C'd Home per MD order. Patient given discharge teaching and paperwork regarding medications, diet, follow-up appointments and activity. Patient understanding verbalized. No questions or complaints at this time. Skin condition as charted. IV and telemetry removed prior to leaving.     An After Visit Summary was printed and given to the patient.    Kyle Rich, Kyle Rich

## 2017-11-29 NOTE — Discharge Summary (Signed)
[facility] -  at [facility]   PATIENT NAME: Kyle Rich    MR#:  [id number]  DATE OF BIRTH:  [birth year]  DATE OF ADMISSION:  11/24/2017 ADMITTING PHYSICIAN: Alford Highland, MD  DATE OF DISCHARGE: 11/29/2017 12:27 PM  PRIMARY CARE PHYSICIAN: Rayetta Humphrey, MD    ADMISSION DIAGNOSIS:  COPD exacerbation (HCC) [J44.1] NSTEMI (non-ST elevated myocardial infarction) (HCC) [I21.4] Acute respiratory failure with hypoxia and hypercapnia (HCC) [J96.01, J96.02]  DISCHARGE DIAGNOSIS:  Active Problems:   Acute respiratory failure with hypoxia (HCC)   SECONDARY DIAGNOSIS:   Past Medical History:  Diagnosis Date  . ACS (acute coronary syndrome) (HCC)    unstable angina in 2017 leading to catheterization/PCI by Dr. Juliann Pares  . Arthritis   . Asthma   . CHF (congestive heart failure) (HCC)   . COPD (chronic obstructive pulmonary disease) (HCC)   . Diabetes mellitus without complication (HCC)   . Hepatitis C   . Hypertension     HOSPITAL COURSE:   62 year old male with past medical history of hypertension, hepatitis C, diabetes, COPD, history of CHF who presented to the hospital with chest pain and shortness of breath and noted to be in acute on chronic respiratory failure with hypoxia.  1.  Acute on chronic respiratory failure with hypoxia- this was secondary to COPD exacerbation. -Initially was admitted to the intensive care unit and started on BiPAP.  He was placed on scheduled IV steroids, duo nebs, Pulmicort nebs and also empiric ceftriaxone, Zithromax.  Patient was weaned off BiPAP and then placed on high flow nasal cannula and eventually weaned down to just his nasal cannula.  He was ambulated on the day of discharge on his baseline home oxygen at 2-3 L and it was no longer hypoxic and therefore being discharged home.  2.  COPD exacerbation-this was the cause of patient's worsening respiratory failure with hypoxia. -Initially patient was on BiPAP, then  weaned off BiPAP and placed on high flow nasal cannula.  He is currently in his baseline nasal cannula at 2-3 L.  He was treated with IV steroids, scheduled duo nebs, Pulmicort nebs and empiric antibiotics as mentioned above. -Patient is now being discharged in a long prednisone taper, maintenance of his inhalers, duo nebs as needed and Levaquin for 5 more days. - Patient's  Has chronic respiratory failure due to copd is life threatening.  Previous ABGs on bipap have documentation of  PC02 of 69.  Patient would benefit from non-invasive ventilator.  Without this therapy, the patient is at high risk of ending up with worsening symptoms, worsened respiratory failure, need for ER visits and/or recurrent hospitalizations.  Bipap has proven to be ineffective.  Patient would benefit from home ventilator  3.  Elevated troponin- and had elevated troponins as high as over 2 during hospitalization.  A cardiology consult was obtained.  Patient was maintained on a heparin drip for about 48 hours. -After discussed with cardiology they did not recommend any acute intervention as patient was clinically asymptomatic without any chest pain or any acute EKG changes.  Patient has had previous cardiac catheterizations done in the past which showed multivessel disease and they recommend bypass surgery but due to patient's underlying bad COPD and ongoing tobacco abuse he is high risk for surgery.   -At this point patient is being discharged on his maintenance therapy with aspirin, Plavix, metoprolol and atorvastatin.  He will follow-up with Dr. Juliann Pares as outpatient.   4.  GERD- pt. Will continue Protonix.  5.  Diabetes type 2 without complication- on the hospital patient was on sliding scale insulin, but he will resume his metformin upon discharge.  Further changes to his meds can be done as an outpatient.  6.  Depression/anxiety- pt. Will continue Lexapro/Xanax.    DISCHARGE CONDITIONS:   Stable.   CONSULTS  OBTAINED:  Treatment Team:  Alwyn Pea, MD  DRUG ALLERGIES:  No Known Allergies  DISCHARGE MEDICATIONS:   Allergies as of 11/29/2017   No Known Allergies     Medication List    STOP taking these medications   ALPRAZolam 0.25 MG tablet Commonly known as:  XANAX   chlorpheniramine-HYDROcodone 10-8 MG/5ML Suer Commonly known as:  TUSSIONEX     TAKE these medications   aspirin 325 MG EC tablet Take 1 tablet (325 mg total) by mouth daily.   atorvastatin 40 MG tablet Commonly known as:  LIPITOR Take 1 tablet (40 mg total) by mouth daily at 6 PM.   clopidogrel 75 MG tablet Commonly known as:  PLAVIX Take 75 mg by mouth daily.   [employer] SENNA 8.6 MG tablet Generic drug:  senna Take 1 tablet by mouth daily.   escitalopram 20 MG tablet Commonly known as:  LEXAPRO Take 1 tablet (20 mg total) at bedtime by mouth.   ezetimibe 10 MG tablet Commonly known as:  ZETIA Take 10 mg by mouth daily.   Fluticasone-Salmeterol 500-50 MCG/DOSE Aepb Commonly known as:  ADVAIR DISKUS Inhale 1 puff every 12 (twelve) hours into the lungs.   ipratropium-albuterol 0.5-2.5 (3) MG/3ML Soln Commonly known as:  DUONEB Take 3 mLs every 6 (six) hours as needed by nebulization (wheezing).   lisinopril 5 MG tablet Commonly known as:  PRINIVIL,ZESTRIL Take 1 tablet (5 mg total) by mouth daily. Start taking on:  11/30/2017   metFORMIN 500 MG tablet Commonly known as:  GLUCOPHAGE Take 500 mg by mouth daily with breakfast.   metoprolol tartrate 25 MG tablet Commonly known as:  LOPRESSOR Take 1 tablet (25 mg total) by mouth 2 (two) times daily.   nicotine 14 mg/24hr patch Commonly known as:  NICODERM CQ - dosed in mg/24 hours Place 1 patch (14 mg total) daily onto the skin.   nitroGLYCERIN 0.4 MG SL tablet Commonly known as:  NITROSTAT Place 0.4 mg under the tongue every 5 (five) minutes x 3 doses as needed for chest pain.   pantoprazole 40 MG tablet Commonly known as:   PROTONIX Take 1 tablet (40 mg total) daily by mouth.   predniSONE 10 MG tablet Commonly known as:  DELTASONE Label  & dispense according to the schedule below. 5 Pills PO for 2 days then, 4 Pills PO for 2 days, 3 Pills PO for 2 days, 2 Pills PO for 2 days, 1 Pill PO for 2 days then STOP. What changed:    how much to take  how to take this  when to take this  additional instructions  Another medication with the same name was removed. Continue taking this medication, and follow the directions you see here.   PROAIR HFA 108 (90 Base) MCG/ACT inhaler Generic drug:  albuterol Inhale 2 puffs into the lungs every 6 (six) hours as needed for wheezing.   SUBOXONE 8-2 MG Film Generic drug:  Buprenorphine HCl-Naloxone HCl Place 1 Film (8 mg total) under the tongue 2 (two) times daily NADEAN [id number]   tiotropium 18 MCG inhalation capsule Commonly known as:  SPIRIVA HANDIHALER Place 1 capsule (18 mcg total) daily  into inhaler and inhale.         DISCHARGE INSTRUCTIONS:   DIET:  Cardiac diet and Diabetic diet  DISCHARGE CONDITION:  Stable  ACTIVITY:  Activity as tolerated  OXYGEN:  Home Oxygen: Yes.     Oxygen Delivery: 2-3 liters/min via Patient connected to nasal cannula oxygen  DISCHARGE LOCATION:  home   If you experience worsening of your admission symptoms, develop shortness of breath, life threatening emergency, suicidal or homicidal thoughts you must seek medical attention immediately by calling 911 or calling your MD immediately  if symptoms less severe.  You Must read complete instructions/literature along with all the possible adverse reactions/side effects for all the Medicines you take and that have been prescribed to you. Take any new Medicines after you have completely understood and accpet all the possible adverse reactions/side effects.   Please note  You were cared for by a hospitalist during your hospital stay. If you have any questions about your  discharge medications or the care you received while you were in the hospital after you are discharged, you can call the unit and asked to speak with the hospitalist on call if the hospitalist that took care of you is not available. Once you are discharged, your primary care physician will handle any further medical issues. Please note that NO REFILLS for any discharge medications will be authorized once you are discharged, as it is imperative that you return to your primary care physician (or establish a relationship with a primary care physician if you do not have one) for your aftercare needs so that they can reassess your need for medications and monitor your lab values.     Today   Shortness of breath much improved since admission.  Patient denies any chest pain.  Patient ambulated with the nurse did not have any for the hypoxemia less than 90% on his 2 L baseline.  VITAL SIGNS:  Blood pressure (!) 129/54, pulse 74, temperature 97.7 F (36.5 C), resp. rate 18, height 5\' 4"  (1.626 m), weight 83.1 kg (183 lb 3.2 oz), SpO2 92 %.  I/O:    Intake/Output Summary (Last 24 hours) at 11/29/2017 1616 Last data filed at 11/29/2017 1100 Gross per 24 hour  Intake 720 ml  Output 1450 ml  Net -730 ml    PHYSICAL EXAMINATION:   GENERAL:  62 y.o.-year-old patient lying in bed in NAD.   EYES: Pupils equal, round, reactive to light and accommodation. No scleral icterus. Extraocular muscles intact.  HEENT: Head atraumatic, normocephalic. Oropharynx and nasopharynx clear.  NECK:  Supple, no jugular venous distention. No thyroid enlargement, no tenderness.  LUNGS: Good air entry bilaterally, minimal end-exp. Wheezing b/l.  No rhonchi, rales.  No use of accessory muscles. CARDIOVASCULAR: S1, S2 normal. No murmurs, rubs, or gallops.  ABDOMEN: Soft, nontender, nondistended. Bowel sounds present. No organomegaly or mass.  EXTREMITIES: No cyanosis, clubbing or edema b/l.    NEUROLOGIC: Cranial nerves II  through XII are intact. No focal Motor or sensory deficits b/l.   PSYCHIATRIC: The patient is alert and oriented x 3.  SKIN: No obvious rash, lesion, or ulcer.     DATA REVIEW:   CBC Recent Labs  Lab 11/28/17 0530  WBC 25.1*  HGB 12.8*  HCT 40.1  PLT 344    Chemistries  Recent Labs  Lab 11/24/17 0621  11/26/17 0522 11/27/17 0500  NA 139   < > 138  --   K 3.8   < > 4.0  --  CL 103   < > 97*  --   CO2 28   < > 31  --   GLUCOSE 159*   < > 155*  --   BUN 25*   < > 43*  --   CREATININE 0.76   < > 0.94  --   CALCIUM 8.3*   < > 8.3*  --   MG 2.0   < >  --  2.2  AST 39  --   --   --   ALT 29  --   --   --   ALKPHOS 69  --   --   --   BILITOT 0.6  --   --   --    < > = values in this interval not displayed.    Cardiac Enzymes Recent Labs  Lab 11/24/17 1407  TROPONINI 1.75*    Microbiology Results  Results for orders placed or performed during the hospital encounter of 11/24/17  MRSA PCR Screening     Status: None   Collection Time: 11/24/17 11:41 AM  Result Value Ref Range Status   MRSA by PCR NEGATIVE NEGATIVE Final    Comment:        The GeneXpert MRSA Assay (FDA approved for NASAL specimens only), is one component of a comprehensive MRSA colonization surveillance program. It is not intended to diagnose MRSA infection nor to guide or monitor treatment for MRSA infections. Performed at [facility], [address] [place], [place] [address]     RADIOLOGY:  Dg Chest Port 1 View  Result Date: 11/28/2017 CLINICAL DATA:  CHF, shortness of breath, weakness. History of asthma-COPD, current smoker, previous MI. EXAM: PORTABLE CHEST 1 VIEW COMPARISON:  Portable chest x-ray of November 25, 2017 FINDINGS: The lungs are well-expanded. There is no focal infiltrate. There is no pleural effusion. The heart and pulmonary vascularity are normal. The mediastinum is normal in width. The trachea is midline. There is calcification in the wall of the aortic arch.  IMPRESSION: COPD. No pneumonia, CHF, nor other acute cardiopulmonary abnormality. Thoracic aortic atherosclerosis. Electronically Signed   By: David  SwazilandJordan M.D.   On: 11/28/2017 09:15      Management plans discussed with the patient, family and they are in agreement.  CODE STATUS:  Code Status History    Date Active Date Inactive Code Status Order ID Comments User Context   11/24/2017 0808 11/29/2017 1527 Full Code [id number]  Alford HighlandWieting, Richard, MD ED   TOTAL TIME TAKING CARE OF THIS PATIENT: 40 minutes.    Houston SirenSAINANI,Bilan Tedesco J M.D on 11/29/2017 at 4:16 PM  Between 7am to 6pm - Pager - [phone]  After 6pm go to [email or web address] - Social research officer, [email or web address] [email or web address]  [facility]  Office  [phone]  CC: Primary care physician; [name], Sionne A, MD

## 2017-11-29 NOTE — Care Management (Signed)
patient again declines home health RN.  He wants to immediatly go back to his outpatient cardiopulmonary rehab.  Awaiting discharge summary to complete the trilology referral.  per [facility], respiratory therapist will make contact with patient today

## 2017-12-04 ENCOUNTER — Telehealth: Payer: Self-pay

## 2017-12-04 ENCOUNTER — Encounter: Payer: Medicaid Other | Attending: Family Medicine

## 2017-12-04 DIAGNOSIS — J449 Chronic obstructive pulmonary disease, unspecified: Secondary | ICD-10-CM | POA: Insufficient documentation

## 2017-12-04 NOTE — Telephone Encounter (Signed)
Flagged on EMMI report for having unfilled RXs and having other questions/problems.  Called and spoke with patient.  He reports there was an issue with the automated call when he tried answering the questions.   He was able to fill all of his medications and doesn't have any questions or concerns at this time.  I thanked him for his feedback and informed him that he would receive one more automated call checking on him in a few days.

## 2017-12-11 DIAGNOSIS — J449 Chronic obstructive pulmonary disease, unspecified: Secondary | ICD-10-CM

## 2017-12-11 NOTE — Progress Notes (Signed)
Pulmonary Individual Treatment Plan  Patient Details  Name: Kyle Rich MRN: [id number] Date of Birth: 04-18-1956 Referring Provider:     Pulmonary Rehab from 09/11/2017 in [facility] Card[facility] Pulmonary Rehab  Referring Provider  Salome Holmes MD      Initial Encounter Date:    Pulmonary Rehab from 09/11/2017 in [facility] Cardiac and Pulmonary Rehab  Date  09/11/17  Referring Provider  Salome Holmes MD      Visit Diagnosis: Chronic obstructive pulmonary disease, unspecified COPD type ([place])  Patient's Home Medications on Admission:  Current Outpatient Medications:  .  albuterol (PROAIR HFA) 108 (90 BASE) MCG/ACT inhaler, Inhale 2 puffs into the lungs every 6 (six) hours as needed for wheezing. , Disp: , Rfl:  .  aspirin EC 325 MG EC tablet, Take 1 tablet (325 mg total) by mouth daily., Disp: 30 tablet, Rfl: 0 .  atorvastatin (LIPITOR) 40 MG tablet, Take 1 tablet (40 mg total) by mouth daily at 6 PM., Disp: 30 tablet, Rfl: 1 .  clopidogrel (PLAVIX) 75 MG tablet, Take 75 mg by mouth daily., Disp: , Rfl: 11 .  [employer] SENNA 8.6 MG tablet, Take 1 tablet by mouth daily., Disp: , Rfl: 11 .  escitalopram (LEXAPRO) 20 MG tablet, Take 1 tablet (20 mg total) at bedtime by mouth., Disp: 30 tablet, Rfl: 2 .  ezetimibe (ZETIA) 10 MG tablet, Take 10 mg by mouth daily., Disp: , Rfl: 11 .  Fluticasone-Salmeterol (ADVAIR DISKUS) 500-50 MCG/DOSE AEPB, Inhale 1 puff every 12 (twelve) hours into the lungs., Disp: 60 each, Rfl: 2 .  ipratropium-albuterol (DUONEB) 0.5-2.5 (3) MG/3ML SOLN, Take 3 mLs every 6 (six) hours as needed by nebulization (wheezing)., Disp: 360 mL, Rfl: 1 .  lisinopril (PRINIVIL,ZESTRIL) 5 MG tablet, Take 1 tablet (5 mg total) by mouth daily., Disp: 30 tablet, Rfl: 1 .  metFORMIN (GLUCOPHAGE) 500 MG tablet, Take 500 mg by mouth daily with breakfast. , Disp: , Rfl:  .  metoprolol tartrate (LOPRESSOR) 25 MG tablet, Take 1 tablet (25 mg total) by mouth 2 (two) times daily., Disp: , Rfl:  .   nicotine (NICODERM CQ - DOSED IN MG/24 HOURS) 14 mg/24hr patch, Place 1 patch (14 mg total) daily onto the skin. (Patient not taking: Reported on 11/25/2017), Disp: 28 patch, Rfl: 0 .  nitroGLYCERIN (NITROSTAT) 0.4 MG SL tablet, Place 0.4 mg under the tongue every 5 (five) minutes x 3 doses as needed for chest pain., Disp: , Rfl: 0 .  pantoprazole (PROTONIX) 40 MG tablet, Take 1 tablet (40 mg total) daily by mouth., Disp: 30 tablet, Rfl: 2 .  predniSONE (DELTASONE) 10 MG tablet, Label  & dispense according to the schedule below. 5 Pills PO for 2 days then, 4 Pills PO for 2 days, 3 Pills PO for 2 days, 2 Pills PO for 2 days, 1 Pill PO for 2 days then STOP., Disp: 30 tablet, Rfl: 0 .  SUBOXONE 8-2 MG FILM, Place 1 Film (8 mg total) under the tongue 2 (two) times daily NADEAN [id number], Disp: , Rfl: 0 .  tiotropium (SPIRIVA HANDIHALER) 18 MCG inhalation capsule, Place 1 capsule (18 mcg total) daily into inhaler and inhale., Disp: 30 capsule, Rfl: 2  Past Medical History: Past Medical History:  Diagnosis Date  . ACS (acute coronary syndrome) (HCC)    unstable angina in 2017 leading to catheterization/PCI by Dr. Clayborn Bigness  . Arthritis   . Asthma   . CHF (congestive heart failure) ([place])   . COPD (chronic  obstructive pulmonary disease) ([place])   . Diabetes mellitus without complication ([place])   . Hepatitis C   . Hypertension     Tobacco Use: Social History   Tobacco Use  Smoking Status Current Every Day Smoker  . Packs/day: 1.00  . Years: 40.00  . Pack years: 40.00  . Types: Cigarettes  Smokeless Tobacco Never Used  Tobacco Comment   he is interested but doesnt want to quit due to him mother passing away    Labs: Recent Review Flowsheet Data    Labs for ITP Cardiac and Pulmonary Rehab Latest Ref Rng & Units 07/02/2017 07/03/2017 07/19/2017 11/24/2017 11/25/2017   Cholestrol 0 - 200 mg/dL - - - - 96   LDLCALC 0 - 99 mg/dL - - - - 49   HDL >40 mg/dL - - - - 31(L)   Trlycerides <150 mg/dL - -  - - 79   Hemoglobin A1c 4.8 - 5.6 % - 6.6(H) - 6.0(H) -   PHART 7.350 - 7.450 7.41 7.38 - 7.30(L) -   PCO2ART 32.0 - 48.0 mmHg 50(H) 48 - 69(HH) -   HCO3 20.0 - 28.0 mmol/L 31.7(H) 28.4(H) 40.3(H) 34.0(H) -   O2SAT % 98.3 93.4 96.1 99.2 -       Pulmonary Assessment Scores: Pulmonary Assessment Scores    Row Name 09/11/17 1410         ADL UCSD   ADL Phase  Entry     SOB Score total  84     Rest  3     Walk  3     Stairs  5     Bath  4     Dress  3     Shop  4       CAT Score   CAT Score  27       mMRC Score   mMRC Score  3        Pulmonary Function Assessment: Pulmonary Function Assessment - 09/11/17 1428      Initial Spirometry Results   FVC%  33 %    FEV1%  27 %    FEV1/FVC Ratio  60.27    Comments  Best of 2, good patient effort      Post Bronchodilator Spirometry Results   FVC%  38.94 %    FEV1%  32.94 %    FEV1/FVC Ratio  62.12    Comments  Best of 2, good patient effort      Breath   Bilateral Breath Sounds  Wheezes    Shortness of Breath  Limiting activity;Fear of Shortness of Breath;Panic with Shortness of Breath;Yes       Exercise Target Goals:    Exercise Program Goal: Individual exercise prescription set using results from initial 6 min walk test and THRR while considering  patient's activity barriers and safety.    Exercise Prescription Goal: Initial exercise prescription builds to 30-45 minutes a day of aerobic activity, 2-3 days per week.  Home exercise guidelines will be given to patient during program as part of exercise prescription that the participant will acknowledge.  Activity Barriers & Risk Stratification: Activity Barriers & Cardiac Risk Stratification - 09/11/17 1539      Activity Barriers & Cardiac Risk Stratification   Activity Barriers  Deconditioning;Muscular Weakness;Shortness of Breath;Joint Problems;Balance Concerns bilateral hip pain       6 Minute Walk: 6 Minute Walk    Row Name 09/11/17 1530         6  Minute  Walk   Phase  Initial     Distance  570 feet     Walk Time  4.65 minutes     # of Rest Breaks  2 1:20, stopped at 5:01     MPH  1.39     METS  1.59     RPE  13     Perceived Dyspnea   2     VO2 Peak  5.55     Symptoms  Yes (comment)     Comments  leg fatigue/heaviness/pain 10/10     Resting HR  67 bpm     Resting BP  146/76     Resting Oxygen Saturation   94 %     Exercise Oxygen Saturation  during 6 min walk  93 %     Max Ex. HR  85 bpm     Max Ex. BP  154/74     2 Minute Post BP  144/70       Interval HR   1 Minute HR  70     2 Minute HR  74     3 Minute HR  74     4 Minute HR  78     5 Minute HR  85     6 Minute HR  75     2 Minute Post HR  66     Interval Heart Rate?  Yes       Interval Oxygen   Interval Oxygen?  Yes     Baseline Oxygen Saturation %  94 %     1 Minute Oxygen Saturation %  93 %     1 Minute Liters of Oxygen  2 L     2 Minute Oxygen Saturation %  94 %     2 Minute Liters of Oxygen  2 L     3 Minute Oxygen Saturation %  94 %     3 Minute Liters of Oxygen  2 L     4 Minute Oxygen Saturation %  95 %     4 Minute Liters of Oxygen  2 L     5 Minute Oxygen Saturation %  95 %     5 Minute Liters of Oxygen  2 L     6 Minute Oxygen Saturation %  95 %     6 Minute Liters of Oxygen  2 L     2 Minute Post Oxygen Saturation %  96 %     2 Minute Post Liters of Oxygen  2 L       Oxygen Initial Assessment: Oxygen Initial Assessment - 09/11/17 1419      Home Oxygen   Home Oxygen Device  Home Concentrator;E-Tanks    Sleep Oxygen Prescription  Continuous    Liters per minute  2    Home Exercise Oxygen Prescription  Continuous    Liters per minute  2    Home at Rest Exercise Oxygen Prescription  Continuous    Liters per minute  2    Compliance with Home Oxygen Use  Yes      Initial 6 min Walk   Oxygen Used  Continuous    Liters per minute  2      Program Oxygen Prescription   Program Oxygen Prescription  Continuous    Liters per minute  2        Intervention   Short Term Goals  To learn and demonstrate proper use of respiratory medications;To learn  and demonstrate proper pursed lip breathing techniques or other breathing techniques.;To learn and understand importance of monitoring SPO2 with pulse oximeter and demonstrate accurate use of the pulse oximeter.;To learn and understand importance of maintaining oxygen saturations>88%;To learn and exhibit compliance with exercise, home and travel O2 prescription takes albuterol nebulizer, pro air inhaler. Advair and spiriva    Long  Term Goals  Exhibits compliance with exercise, home and travel O2 prescription;Verbalizes importance of monitoring SPO2 with pulse oximeter and return demonstration;Maintenance of O2 saturations>88%;Exhibits proper breathing techniques, such as pursed lip breathing or other method taught during program session;Compliance with respiratory medication;Demonstrates proper use of MDI's       Oxygen Re-Evaluation: Oxygen Re-Evaluation    Row Name 09/18/17 1125 10/13/17 1212 11/08/17 1217         Program Oxygen Prescription   Program Oxygen Prescription  Continuous  Continuous  Continuous     Liters per minute  2  2  2        Home Oxygen   Home Oxygen Device  Home Concentrator;E-Tanks  Home Concentrator;E-Tanks  Home Concentrator;E-Tanks     Sleep Oxygen Prescription  Continuous  Continuous  Continuous     Liters per minute  2  2  2      Home Exercise Oxygen Prescription  Continuous  Continuous  Continuous     Liters per minute  2  2  2      Home at Rest Exercise Oxygen Prescription  Continuous  Continuous  Continuous     Liters per minute  2  -  2     Compliance with Home Oxygen Use  Yes  Yes  Yes       Goals/Expected Outcomes   Short Term Goals  To learn and demonstrate proper use of respiratory medications;To learn and demonstrate proper pursed lip breathing techniques or other breathing techniques.;To learn and understand importance of monitoring SPO2 with  pulse oximeter and demonstrate accurate use of the pulse oximeter.;To learn and understand importance of maintaining oxygen saturations>88%;To learn and exhibit compliance with exercise, home and travel O2 prescription  To learn and demonstrate proper use of respiratory medications;To learn and demonstrate proper pursed lip breathing techniques or other breathing techniques.;To learn and understand importance of monitoring SPO2 with pulse oximeter and demonstrate accurate use of the pulse oximeter.;To learn and understand importance of maintaining oxygen saturations>88%;To learn and exhibit compliance with exercise, home and travel O2 prescription  To learn and demonstrate proper use of respiratory medications;To learn and demonstrate proper pursed lip breathing techniques or other breathing techniques.;To learn and understand importance of monitoring SPO2 with pulse oximeter and demonstrate accurate use of the pulse oximeter.;To learn and understand importance of maintaining oxygen saturations>88%;To learn and exhibit compliance with exercise, home and travel O2 prescription     Long  Term Goals  Exhibits compliance with exercise, home and travel O2 prescription;Verbalizes importance of monitoring SPO2 with pulse oximeter and return demonstration;Maintenance of O2 saturations>88%;Exhibits proper breathing techniques, such as pursed lip breathing or other method taught during program session;Compliance with respiratory medication;Demonstrates proper use of MDI's  Exhibits compliance with exercise, home and travel O2 prescription;Verbalizes importance of monitoring SPO2 with pulse oximeter and return demonstration;Maintenance of O2 saturations>88%;Exhibits proper breathing techniques, such as pursed lip breathing or other method taught during program session;Compliance with respiratory medication;Demonstrates proper use of MDI's  Exhibits compliance with exercise, home and travel O2 prescription;Verbalizes  importance of monitoring SPO2 with pulse oximeter and return demonstration;Maintenance of O2 saturations>88%;Exhibits proper breathing techniques,  such as pursed lip breathing or other method taught during program session;Compliance with respiratory medication;Demonstrates proper use of MDI's     Comments  Reviewed PLB technique with pt.  Talked about how it work and it's important to maintaining his exercise saturations.    Corney states that he checks his oxygen twice a day at home. He has been working on PLB. He uses his nebulizer 3-4 times a day. He is wanting to quit smoking but states not that much. Gave patient information about quitting smoking.  He has been moving recently and was out of class for a little while. He states he can tell that he is a little more short of breath. He states when he checked his oxygen at home when he went and got the mail his oxygen level was 88 percent. He would like to try to use 1 liter of oxygen his next session.     Goals/Expected Outcomes  Short: Become more profiecient at using PLB.   Long: Become independent at using PLB.  Short: work on decreasing how many cigarettes he smokes. Long: Quit smoking.  Short: turn oxygen down 1 liter for exercise. Long: decrease oxygen to room air.        Oxygen Discharge (Final Oxygen Re-Evaluation): Oxygen Re-Evaluation - 11/08/17 1217      Program Oxygen Prescription   Program Oxygen Prescription  Continuous    Liters per minute  2      Home Oxygen   Home Oxygen Device  Home Concentrator;E-Tanks    Sleep Oxygen Prescription  Continuous    Liters per minute  2    Home Exercise Oxygen Prescription  Continuous    Liters per minute  2    Home at Rest Exercise Oxygen Prescription  Continuous    Liters per minute  2    Compliance with Home Oxygen Use  Yes      Goals/Expected Outcomes   Short Term Goals  To learn and demonstrate proper use of respiratory medications;To learn and demonstrate proper pursed lip breathing  techniques or other breathing techniques.;To learn and understand importance of monitoring SPO2 with pulse oximeter and demonstrate accurate use of the pulse oximeter.;To learn and understand importance of maintaining oxygen saturations>88%;To learn and exhibit compliance with exercise, home and travel O2 prescription    Long  Term Goals  Exhibits compliance with exercise, home and travel O2 prescription;Verbalizes importance of monitoring SPO2 with pulse oximeter and return demonstration;Maintenance of O2 saturations>88%;Exhibits proper breathing techniques, such as pursed lip breathing or other method taught during program session;Compliance with respiratory medication;Demonstrates proper use of MDI's    Comments  He has been moving recently and was out of class for a little while. He states he can tell that he is a little more short of breath. He states when he checked his oxygen at home when he went and got the mail his oxygen level was 88 percent. He would like to try to use 1 liter of oxygen his next session.    Goals/Expected Outcomes  Short: turn oxygen down 1 liter for exercise. Long: decrease oxygen to room air.       Initial Exercise Prescription: Initial Exercise Prescription - 09/11/17 1500      Date of Initial Exercise RX and Referring Provider   Date  09/11/17    Referring Provider  Salome Holmes MD      Oxygen   Oxygen  Continuous    Liters  2      Treadmill  MPH  1    Grade  0    Minutes  15    METs  1.77      NuStep   Level  1    SPM  80    Minutes  15    METs  1.7      Arm Ergometer   Level  1    Watts  22    RPM  25    Minutes  15    METs  1.7      Prescription Details   Frequency (times per week)  3    Duration  Progress to 45 minutes of aerobic exercise without signs/symptoms of physical distress      Intensity   THRR 40-80% of Max Heartrate  103-139    Ratings of Perceived Exertion  11-15    Perceived Dyspnea  0-4      Progression    Progression  Continue to progress workloads to maintain intensity without signs/symptoms of physical distress.      Resistance Training   Training Prescription  Yes    Weight  3 lbs    Reps  10-15       Perform Capillary Blood Glucose checks as needed.  Exercise Prescription Changes: Exercise Prescription Changes    Row Name 09/11/17 1500 09/19/17 1600 10/04/17 1200 10/17/17 1500 10/31/17 1200     Response to Exercise   Blood Pressure (Admit)  146/76  104/64  126/62  130/80  134/72   Blood Pressure (Exercise)  154/74  132/76  -  -  -   Blood Pressure (Exit)  144/70  -  112/70  142/82  130/68   Heart Rate (Admit)  67 bpm  61 bpm  70 bpm  62 bpm  68 bpm   Heart Rate (Exercise)  85 bpm  79 bpm  86 bpm  65 bpm  68 bpm   Heart Rate (Exit)  66 bpm  -  66 bpm  62 bpm  60 bpm   Oxygen Saturation (Admit)  94 %  95 %  93 %  95 %  95 %   Oxygen Saturation (Exercise)  93 %  94 %  94 %  98 %  97 %   Oxygen Saturation (Exit)  96 %  -  96 %  95 %  96 %   Rating of Perceived Exertion (Exercise)  13  13  12  15  13    Perceived Dyspnea (Exercise)  2  4  1  3  2    Symptoms  leg fatigue/heaviness/pain 10/10  SOB, fatigue  none  none  none   Comments  walk test results  first full day of exercise  -  -  -   Duration  -  Progress to 45 minutes of aerobic exercise without signs/symptoms of physical distress  Continue with 45 min of aerobic exercise without signs/symptoms of physical distress.  Continue with 45 min of aerobic exercise without signs/symptoms of physical distress.  Continue with 45 min of aerobic exercise without signs/symptoms of physical distress.   Intensity  -  THRR unchanged  THRR unchanged  THRR unchanged  THRR unchanged     Progression   Progression  -  Continue to progress workloads to maintain intensity without signs/symptoms of physical distress.  Continue to progress workloads to maintain intensity without signs/symptoms of physical distress.  Continue to progress workloads to  maintain intensity without signs/symptoms of physical distress.  Continue to progress workloads to  maintain intensity without signs/symptoms of physical distress.   Average METs  -  1.65  2  2.02  2.24     Resistance Training   Training Prescription  -  Yes  Yes  Yes  Yes   Weight  -  3 lbs  3 lbs  4 lbs  4 lbs   Reps  -  10-15  10-15  10-15  10-15     Interval Training   Interval Training  -  No  No  No  No     Oxygen   Oxygen  -  Continuous  Continuous  Continuous  Continuous   Liters  -  2  2  2  2      Treadmill   MPH  -  1  0.8  1  1.2   Grade  -  0  0  0  0   Minutes  -  15  15  15  15    METs  -  1.77  1.7  1.77  1.92     NuStep   Level  -  1  2  4  6    SPM  -  49  84  84  60   Minutes  -  15  15  15  15    METs  -  1.6  2.1  2.2  2.5     Arm Ergometer   Level  -  -  1  1  2    RPM  -  -  36  -  43   Minutes  -  -  15  15  15    METs  -  -  2.2  2.1  2.3     Home Exercise Plan   Plans to continue exercise at  -  -  Home (comment) walking with dog and at stores  Home (comment) walking with dog and at stores  Home (comment) walking with dog and at stores   Frequency  -  -  Add 1 additional day to program exercise sessions.  Add 1 additional day to program exercise sessions.  Add 1 additional day to program exercise sessions.   Initial Home Exercises Provided  -  -  10/04/17  10/04/17  10/04/17   Row Name 11/14/17 1600             Response to Exercise   Blood Pressure (Admit)  124/82       Blood Pressure (Exit)  122/80       Heart Rate (Admit)  115 bpm       Heart Rate (Exercise)  83 bpm       Heart Rate (Exit)  67 bpm       Oxygen Saturation (Admit)  94 %       Oxygen Saturation (Exercise)  93 %       Oxygen Saturation (Exit)  97 %       Rating of Perceived Exertion (Exercise)  13       Perceived Dyspnea (Exercise)  3       Symptoms  SOB       Duration  Continue with 45 min of aerobic exercise without signs/symptoms of physical distress.       Intensity  THRR  unchanged         Progression   Progression  Continue to progress workloads to maintain intensity without signs/symptoms of physical distress.       Average METs  2.12  Resistance Training   Training Prescription  Yes       Weight  10 lbs       Reps  10-15         Interval Training   Interval Training  No         Oxygen   Oxygen  Continuous       Liters  2         Treadmill   MPH  1       Grade  0       Minutes  15       METs  1.77         NuStep   Level  7       SPM  60       Minutes  15       METs  2.5         Arm Ergometer   Level  2       Minutes  15       METs  2.1         Home Exercise Plan   Plans to continue exercise at  Home (comment) walking with dog and at stores       Frequency  Add 2 additional days to program exercise sessions.       Initial Home Exercises Provided  10/04/17          Exercise Comments: Exercise Comments    Row Name 09/18/17 1124           Exercise Comments  First full day of exercise!  Patient was oriented to gym and equipment including functions, settings, policies, and procedures.  Patient's individual exercise prescription and treatment plan were reviewed.  All starting workloads were established based on the results of the 6 minute walk test done at initial orientation visit.  The plan for exercise progression was also introduced and progression will be customized based on patient's performance and goals.          Exercise Goals and Review: Exercise Goals    Row Name 09/11/17 1544             Exercise Goals   Increase Physical Activity  Yes       Intervention  Provide advice, education, support and counseling about physical activity/exercise needs.;Develop an individualized exercise prescription for aerobic and resistive training based on initial evaluation findings, risk stratification, comorbidities and participant's personal goals.       Expected Outcomes  Achievement of increased cardiorespiratory fitness  and enhanced flexibility, muscular endurance and strength shown through measurements of functional capacity and personal statement of participant.       Increase Strength and Stamina  Yes       Intervention  Provide advice, education, support and counseling about physical activity/exercise needs.;Develop an individualized exercise prescription for aerobic and resistive training based on initial evaluation findings, risk stratification, comorbidities and participant's personal goals.       Expected Outcomes  Achievement of increased cardiorespiratory fitness and enhanced flexibility, muscular endurance and strength shown through measurements of functional capacity and personal statement of participant.       Able to understand and use rate of perceived exertion (RPE) scale  Yes       Intervention  Provide education and explanation on how to use RPE scale       Expected Outcomes  Short Term: Able to use RPE daily in rehab to express subjective intensity level;Long Term:  Able  to use RPE to guide intensity level when exercising independently       Able to understand and use Dyspnea scale  Yes       Intervention  Provide education and explanation on how to use Dyspnea scale       Expected Outcomes  Short Term: Able to use Dyspnea scale daily in rehab to express subjective sense of shortness of breath during exertion;Long Term: Able to use Dyspnea scale to guide intensity level when exercising independently       Knowledge and understanding of Target Heart Rate Range (THRR)  Yes       Intervention  Provide education and explanation of THRR including how the numbers were predicted and where they are located for reference       Expected Outcomes  Short Term: Able to state/look up THRR;Long Term: Able to use THRR to govern intensity when exercising independently;Short Term: Able to use daily as guideline for intensity in rehab       Able to check pulse independently  Yes       Intervention  Provide education  and demonstration on how to check pulse in carotid and radial arteries.;Review the importance of being able to check your own pulse for safety during independent exercise       Expected Outcomes  Short Term: Able to explain why pulse checking is important during independent exercise;Long Term: Able to check pulse independently and accurately       Understanding of Exercise Prescription  Yes       Intervention  Provide education, explanation, and written materials on patient's individual exercise prescription       Expected Outcomes  Short Term: Able to explain program exercise prescription;Long Term: Able to explain home exercise prescription to exercise independently          Exercise Goals Re-Evaluation : Exercise Goals Re-Evaluation    Row Name 09/18/17 1124 09/19/17 1612 10/04/17 1223 10/17/17 1456 10/31/17 1246     Exercise Goal Re-Evaluation   Exercise Goals Review  Understanding of Exercise Prescription;Able to understand and use Dyspnea scale;Knowledge and understanding of Target Heart Rate Range (THRR);Able to understand and use rate of perceived exertion (RPE) scale  Increase Physical Activity;Increase Strength and Stamina;Understanding of Exercise Prescription  Increase Physical Activity;Increase Strength and Stamina;Understanding of Exercise Prescription;Able to understand and use rate of perceived exertion (RPE) scale;Able to check pulse independently;Knowledge and understanding of Target Heart Rate Range (THRR);Able to understand and use Dyspnea scale  Increase Physical Activity;Increase Strength and Stamina;Understanding of Exercise Prescription  Increase Physical Activity;Increase Strength and Stamina;Understanding of Exercise Prescription   Comments  Reviewed RPE scale, THR and program prescription with pt today.  Pt voiced understanding and was given a copy of goals to take home.   Simona Huh has completed his first full day of exercise.  We will continue to monitor his progression.    Simona Huh is off to a good start in rehab.  He is already feeling stronger and has more stamina.  He says that he is feeling better overall.  Currently, he is not doing any home exercise so we talked about finding ways to carve out some time to get his home exercise.  Reviewed home exercise with pt today.  Pt plans to walk at home with dog and stores when doing his grocery shopping for exercise.  Reviewed THR, pulse, RPE, sign and symptoms, and when to call 911 or MD.  Also discussed weather considerations and indoor options.  Pt voiced  understanding.  Simona Huh has been doing well in rehab. He now up to 1.0 mph on the treadmill!  We will continue to encourage him to walk more at home.   We will continue to monitor his pogress.  Simona Huh has been out since 2/15.  He had worked up to level 2 on the arm crank.  Once he returns, we will continue to work on increasing workloads.  We will continue to monitor his progression.    Expected Outcomes  Short: Use RPE daily to regulate intensity.  Long: Follow program prescription in THR.  Short: Attend rehab regularly.  Long: Follow program prescription.   Short: Add in at least one extra day at week at home.  Long: Continue to increase physical activity.   Short: Increase speed on treadmill.  Long: Continue to increase activity at home.   Short: Return to rehab regularly.  Long: Continue to try to increase activity at home to build strength and stamina.   [place] Name 11/08/17 1220 11/14/17 1603 11/29/17 1515         Exercise Goal Re-Evaluation   Exercise Goals Review  Increase Physical Activity;Increase Strength and Stamina  Increase Physical Activity;Increase Strength and Stamina;Understanding of Exercise Prescription  -     Comments  Simona Huh is going to try to use 1 liter of oxygen while doing his exercises. He will try to exercise at home but he is in the process of moving. He wants to increase his strength and stamina.  Simona Huh has  been doing well in rehab.  He is now up to  level 2 on the arm crank.  He is also using 10 lbs handweights!!.  We will continue to monitor his progression and remind him to try to exercise on 1L of oxygen.   Out since last review     Expected Outcomes  Short: keep oxygen saturation above 88 percent while exercising. Long: maintain oxygen sats above 88 percent on room air post [facility].  Short: Increase workload on stepper.   Long: Continue to work on increasing strength and stamina.   -        Discharge Exercise Prescription (Final Exercise Prescription Changes): Exercise Prescription Changes - 11/14/17 1600      Response to Exercise   Blood Pressure (Admit)  124/82    Blood Pressure (Exit)  122/80    Heart Rate (Admit)  115 bpm    Heart Rate (Exercise)  83 bpm    Heart Rate (Exit)  67 bpm    Oxygen Saturation (Admit)  94 %    Oxygen Saturation (Exercise)  93 %    Oxygen Saturation (Exit)  97 %    Rating of Perceived Exertion (Exercise)  13    Perceived Dyspnea (Exercise)  3    Symptoms  SOB    Duration  Continue with 45 min of aerobic exercise without signs/symptoms of physical distress.    Intensity  THRR unchanged      Progression   Progression  Continue to progress workloads to maintain intensity without signs/symptoms of physical distress.    Average METs  2.12      Resistance Training   Training Prescription  Yes    Weight  10 lbs    Reps  10-15      Interval Training   Interval Training  No      Oxygen   Oxygen  Continuous    Liters  2      Treadmill   MPH  1  Grade  0    Minutes  15    METs  1.77      NuStep   Level  7    SPM  60    Minutes  15    METs  2.5      Arm Ergometer   Level  2    Minutes  15    METs  2.1      Home Exercise Plan   Plans to continue exercise at  Home (comment) walking with dog and at stores    Frequency  Add 2 additional days to program exercise sessions.    Initial Home Exercises Provided  10/04/17       Nutrition:  Target Goals: Understanding of nutrition  guidelines, daily intake of sodium <1580m, cholesterol <2034m calories 30% from fat and 7% or less from saturated fats, daily to have 5 or more servings of fruits and vegetables.  Biometrics: Pre Biometrics - 09/11/17 1545      Pre Biometrics   Height  5' 3.5" (1.613 m)    Weight  196 lb 11.2 oz (89.2 kg)    Waist Circumference  43 inches    Hip Circumference  42 inches    Waist to Hip Ratio  1.02 %    BMI (Calculated)  34.29        Nutrition Therapy Plan and Nutrition Goals: Nutrition Therapy & Goals - 09/11/17 1409      Personal Nutrition Goals   Comments  Patients wants to lose weight and eat healthier. He would like to meet with the dietician.      Intervention Plan   Intervention  Prescribe, educate and counsel regarding individualized specific dietary modifications aiming towards targeted core components such as weight, hypertension, lipid management, diabetes, heart failure and other comorbidities.;Nutrition handout(s) given to patient.    Expected Outcomes  Short Term Goal: Understand basic principles of dietary content, such as calories, fat, sodium, cholesterol and nutrients.;Long Term Goal: Adherence to prescribed nutrition plan.       Nutrition Assessments:   Nutrition Goals Re-Evaluation: Nutrition Goals Re-Evaluation    [place] 10/13/17 1158 11/08/17 1223           Goals   Current Weight  192 lb (87.1 kg)  193 lb (87.5 kg)      Nutrition Goal  Lose wieght, eat healthier and Meet with the Dietician.  Lose some weight, eat healthier meals and Meet with the dietician.      Comment  JaMasaiants to eat healthier and is willing to make changes in his diet. Money is a factor in his eating decisions. Made an appointment to meet with the dietician on Feb 18th   Made an appointment with the dietician on March 27th.       Expected Outcome  Short: meet with the dietician. Long: adhere to a diet plan.   Short: meet with the dietician. Long: adhere to a diet plan.           Nutrition Goals Discharge (Final Nutrition Goals Re-Evaluation): Nutrition Goals Re-Evaluation - 11/08/17 1223      Goals   Current Weight  193 lb (87.5 kg)    Nutrition Goal  Lose some weight, eat healthier meals and Meet with the dietician.    Comment  Made an appointment with the dietician on March 27th.     Expected Outcome  Short: meet with the dietician. Long: adhere to a diet plan.       Psychosocial:  Target Goals: Acknowledge presence or absence of significant depression and/or stress, maximize coping skills, provide positive support system. Participant is able to verbalize types and ability to use techniques and skills needed for reducing stress and depression.   Initial Review & Psychosocial Screening: Initial Psych Review & Screening - 09/11/17 1404      Initial Review   Current issues with  Current Depression;History of Depression;Current Psychotropic Meds;Current Sleep Concerns;Current Stress Concerns    Source of Stress Concerns  Chronic Illness;Family;Unable to perform yard/household activities;Unable to participate in former interests or hobbies    Comments  His mother died about three weeks ago and it has  been really hard on him.      Family Dynamics   Good Support System?  No    Strains  Intra-family strains    Comments  He blames his sister for putting his mom in a home. He feels like she was not getting the care she needed when she was in the home and thats what killed her.      Barriers   Psychosocial barriers to participate in program  The patient should benefit from training in stress management and relaxation.      Screening Interventions   Interventions  Yes;Encouraged to exercise;Program counselor consult;To provide support and resources with identified psychosocial needs;Provide feedback about the scores to participant    Expected Outcomes  Short Term goal: Utilizing psychosocial counselor, staff and physician to assist with identification of specific  Stressors or current issues interfering with healing process. Setting desired goal for each stressor or current issue identified.;Long Term Goal: Stressors or current issues are controlled or eliminated.;Short Term goal: Identification and review with participant of any Quality of Life or Depression concerns found by scoring the questionnaire.;Long Term goal: The participant improves quality of Life and PHQ9 Scores as seen by post scores and/or verbalization of changes       Quality of Life Scores:  Scores of 19 and below usually indicate a poorer quality of life in these areas.  A difference of  2-3 points is a clinically meaningful difference.  A difference of 2-3 points in the total score of the Quality of Life Index has been associated with significant improvement in overall quality of life, self-image, physical symptoms, and general health in studies assessing change in quality of life.  PHQ-9: Recent Review Flowsheet Data    Depression screen [facility] 2/9 09/11/2017   Decreased Interest 2   Down, Depressed, Hopeless 2   PHQ - 2 Score 4   Altered sleeping 3   Tired, decreased energy 3   Change in appetite 2   Feeling bad or failure about yourself  3   Trouble concentrating 2   Moving slowly or fidgety/restless 1   Suicidal thoughts 2    PHQ-9 Score 20   Difficult doing work/chores Somewhat difficult     Interpretation of Total Score  Total Score Depression Severity:  1-4 = Minimal depression, 5-9 = Mild depression, 10-14 = Moderate depression, 15-19 = Moderately severe depression, 20-27 = Severe depression   Psychosocial Evaluation and Intervention: Psychosocial Evaluation - 10/02/17 1225      Psychosocial Evaluation & Interventions   Interventions  Encouraged to exercise with the program and follow exercise prescription;Stress management education;Relaxation education    Comments  Counselor met with Mr. Slatten Walen) today for initial psychosocial evaluation.  He is a 62 year old who  has COPD and a recent heart attack and stents inserted.  Maya also  was recently diagnosed with diabetes as well.  He has a limited support system with a daughter and her child who lives with him, and some involvement in his local church.  His mother was living with him until she passed recently and there is some conflict with his siblings surrounding that currently.  Kail reports not sleeping well but having a good appetite.  Rohen admits to depression currently as he states his mother was his "best friend".  He has been seeing a psychiatrist in [facility] for these mood issues.  He is also on several meds for depression and anxiety - however his current PHQ-9 is "20" indicating Severe Depression at this time.  Michah also has multiple stressors with his health, the conflict with his siblings and severe financial issues.  He states he will likely be "kicked out" of his rental home soon due to inability to pay rent - and he has no money for heat in his house at this time.  Counselor provided Wanya with information on [employer] for assistance.  Counselor also recommended [facility] support groups to him as he struggles through the loss of his mother.  Maxemiliano checked that he has "thoughts that he would be better off dead."  Counselor assessed with Gearald stating he has no plan and is just feeling sad mostly missing his mother. He is followed by his psychiatrist and he confirmed a plan if these thoughts became worse.  Counselor will follow with Jeneen Rinks on all of this.     Expected Outcomes  Cashus will benefit from consistent exercise to achieve his goals of breathing better and having a  better quality of life.  He will contact [address] for financial and housing assistance.  He will look into Grief Share or Hospice for grief support and will follow with his psychiatrist for mood stabilization.      Continue Psychosocial Services   Follow up required by counselor       Psychosocial Re-Evaluation: Psychosocial  Re-Evaluation    [place] Name 10/13/17 1154 11/08/17 1228           Psychosocial Re-Evaluation   Current issues with  History of Depression;Current Depression;Current Stress Concerns  History of Depression;Current Depression;Current Stress Concerns      Comments  Seldon is trying to pay bills and is having a hard time. He is also having trouble getting a ride to [place]. He is having trouble telling [employer] that he needs his ride to [place] so he can improve his breathing.   Taris is trying to move and is having a lot of trouble getting all the information together for his daughter and her daughter. His ride to [place] has been difficult. He has to use [employer] to get here. He has bought a moped and is trying to fix it up so he can drive himself. He got his license recently and is proud of that. His rides for [employer] have been doing ok but with all of his appointments he has missed a few rides. He can only miss three rides in a month before they stop picking him up.      Expected Outcomes  Short: Call [employer] to get rides to [place]. Long: Maintain getting rides to [facility] to reduce stress.  Short: attened [facility] to decrease stress. Long: maintain an exercise routine to minimize stress.      Interventions  Encouraged to attend Pulmonary Rehabilitation for the exercise  Encouraged to attend Pulmonary Rehabilitation for the exercise;Stress management education;Relaxation education  Continue Psychosocial Services   Follow up required by staff  Follow up required by staff         Psychosocial Discharge (Final Psychosocial Re-Evaluation): Psychosocial Re-Evaluation - 11/08/17 1228      Psychosocial Re-Evaluation   Current issues with  History of Depression;Current Depression;Current Stress Concerns    Comments  Jamaurie is trying to move and is having a lot of trouble getting all the information together for his daughter and her daughter. His ride to [place] has been difficult. He has to use [employer] to get  here. He has bought a moped and is trying to fix it up so he can drive himself. He got his license recently and is proud of that. His rides for [employer] have been doing ok but with all of his appointments he has missed a few rides. He can only miss three rides in a month before they stop picking him up.    Expected Outcomes  Short: attened [facility] to decrease stress. Long: maintain an exercise routine to minimize stress.    Interventions  Encouraged to attend Pulmonary Rehabilitation for the exercise;Stress management education;Relaxation education    Continue Psychosocial Services   Follow up required by staff       Education: Education Goals: Education classes will be provided on a weekly basis, covering required topics. Participant will state understanding/return demonstration of topics presented.  Learning Barriers/Preferences: Learning Barriers/Preferences - 09/11/17 1412      Learning Barriers/Preferences   Learning Barriers  None    Learning Preferences  None       Education Topics:  Initial Evaluation Education: - Verbal, written and demonstration of respiratory meds, oximetry and breathing techniques. Instruction on use of nebulizers and MDIs and importance of monitoring MDI activations.   Pulmonary Rehab from 11/15/2017 in [facility] Cardiac and Pulmonary Rehab  Date  09/11/17  Educator  [facility]  Instruction Review Code  1- Verbalizes Understanding      General Nutrition Guidelines/Fats and Fiber: -Group instruction provided by verbal, written material, models and posters to present the general guidelines for heart healthy nutrition. Gives an explanation and review of dietary fats and fiber.   Pulmonary Rehab from 11/15/2017 in [facility] Cardiac and Pulmonary Rehab  Date  10/02/17  Educator  [facility]  Instruction Review Code  1- Verbalizes Understanding      Controlling Sodium/Reading Food Labels: -Group verbal and written material supporting the discussion of sodium use in heart healthy  nutrition. Review and explanation with models, verbal and written materials for utilization of the food label.   Exercise Physiology & General Exercise Guidelines: - Group verbal and written instruction with models to review the exercise physiology of the cardiovascular system and associated critical values. Provides general exercise guidelines with specific guidelines to those with heart or lung disease.    Aerobic Exercise & Resistance Training: - Gives group verbal and written instruction on the various components of exercise. Focuses on aerobic and resistive training programs and the benefits of this training and how to safely progress through these programs.   Pulmonary Rehab from 11/15/2017 in [facility] Cardiac and Pulmonary Rehab  Date  09/22/17  Educator  [facility]  Instruction Review Code  1- Verbalizes Understanding      Flexibility, Balance, Mind/Body Relaxation: Provides group verbal/written instruction on the benefits of flexibility and balance training, including mind/body exercise modes such as yoga, pilates and tai chi.  Demonstration and skill practice provided.   Pulmonary Rehab from 11/15/2017 in [facility] Cardiac and  Pulmonary Rehab  Date  10/18/17  Educator  [facility]  Instruction Review Code  1- Verbalizes Understanding      Stress and Anxiety: - Provides group verbal and written instruction about the health risks of elevated stress and causes of high stress.  Discuss the correlation between heart/lung disease and anxiety and treatment options. Review healthy ways to manage with stress and anxiety.   Pulmonary Rehab from 11/15/2017 in [facility] Cardiac and Pulmonary Rehab  Date  11/08/17  Educator  [facility]  Instruction Review Code  1- Verbalizes Understanding      Depression: - Provides group verbal and written instruction on the correlation between heart/lung disease and depressed mood, treatment options, and the stigmas associated with seeking treatment.   Exercise & Equipment  Safety: - Individual verbal instruction and demonstration of equipment use and safety with use of the equipment.   Pulmonary Rehab from 11/15/2017 in [facility] Cardiac and Pulmonary Rehab  Date  09/11/17  Educator  [facility]  Instruction Review Code  1- Verbalizes Understanding      Infection Prevention: - Provides verbal and written material to individual with discussion of infection control including proper hand washing and proper equipment cleaning during exercise session.   Pulmonary Rehab from 11/15/2017 in [facility] Cardiac and Pulmonary Rehab  Date  09/11/17  Educator  [facility]  Instruction Review Code  1- Verbalizes Understanding      Falls Prevention: - Provides verbal and written material to individual with discussion of falls prevention and safety.   Pulmonary Rehab from 11/15/2017 in [facility] Cardiac and Pulmonary Rehab  Date  09/11/17  Educator  [facility]  Instruction Review Code  1- Verbalizes Understanding      Diabetes: - Individual verbal and written instruction to review signs/symptoms of diabetes, desired ranges of glucose level fasting, after meals and with exercise. Advice that pre and post exercise glucose checks will be done for 3 sessions at entry of program.   Chronic Lung Diseases: - Group verbal and written instruction to review updates, respiratory medications, advancements in procedures and treatments. Discuss use of supplemental oxygen including available portable oxygen systems, continuous and intermittent flow rates, concentrators, personal use and safety guidelines. Review proper use of inhaler and spacers. Provide informative websites for self-education.    Energy Conservation: - Provide group verbal and written instruction for methods to conserve energy, plan and organize activities. Instruct on pacing techniques, use of adaptive equipment and posture/positioning to relieve shortness of breath.   Triggers and Exacerbations: - Group verbal and written instruction to review types  of environmental triggers and ways to prevent exacerbations. Discuss weather changes, air quality and the benefits of nasal washing. Review warning signs and symptoms to help prevent infections. Discuss techniques for effective airway clearance, coughing, and vibrations.   Pulmonary Rehab from 11/15/2017 in [facility] Cardiac and Pulmonary Rehab  Date  09/20/17  Educator  [facility]  Instruction Review Code  1- Verbalizes Understanding      AED/CPR: - Group verbal and written instruction with the use of models to demonstrate the basic use of the AED with the basic ABC's of resuscitation.   Anatomy and Physiology of the Lungs: - Group verbal and written instruction with the use of models to provide basic lung anatomy and physiology related to function, structure and complications of lung disease.   Pulmonary Rehab from 11/15/2017 in [facility] Cardiac and Pulmonary Rehab  Date  11/15/17  Educator  [facility]  Instruction Review Code  1- Verbalizes Understanding  Anatomy & Physiology of the Heart: - Group verbal and written instruction and models provide basic cardiac anatomy and physiology, with the coronary electrical and arterial systems. Review of Valvular disease and Heart Failure   Pulmonary Rehab from 11/15/2017 in [facility] Cardiac and Pulmonary Rehab  Date  10/04/17  Educator  [facility]  Instruction Review Code  1- Verbalizes Understanding      Cardiac Medications: - Group verbal and written instruction to review commonly prescribed medications for heart disease. Reviews the medication, class of the drug, and side effects.   Pulmonary Rehab from 11/15/2017 in [facility] Cardiac and Pulmonary Rehab  Date  10/20/17  Educator  [facility]  Instruction Review Code  1- Verbalizes Understanding      Know Your Numbers and Risk Factors: -Group verbal and written instruction about important numbers in your health.  Discussion of what are risk factors and how they play a role in the disease process.  Review of Cholesterol, Blood  Pressure, Diabetes, and BMI and the role they play in your overall health.   Sleep Hygiene: -Provides group verbal and written instruction about how sleep can affect your health.  Define sleep hygiene, discuss sleep cycles and impact of sleep habits. Review good sleep hygiene tips.    Other: -Provides group and verbal instruction on various topics (see comments)    Knowledge Questionnaire Score: Knowledge Questionnaire Score - 09/11/17 1412      Knowledge Questionnaire Score   Pre Score  13/18 reviewed with patient        Core Components/Risk Factors/Patient Goals at Admission: Personal Goals and Risk Factors at Admission - 09/11/17 1422      Core Components/Risk Factors/Patient Goals on Admission    Weight Management  Yes;Weight Loss    Intervention  Weight Management: Develop a combined nutrition and exercise program designed to reach desired caloric intake, while maintaining appropriate intake of nutrient and fiber, sodium and fats, and appropriate energy expenditure required for the weight goal.;Weight Management: Provide education and appropriate resources to help participant work on and attain dietary goals.;Weight Management/Obesity: Establish reasonable short term and long term weight goals.    Admit Weight  196 lb 11.2 oz (89.2 kg)    Goal Weight: Short Term  191 lb (86.6 kg)    Goal Weight: Long Term  180 lb (81.6 kg)    Expected Outcomes  Short Term: Continue to assess and modify interventions until short term weight is achieved;Long Term: Adherence to nutrition and physical activity/exercise program aimed toward attainment of established weight goal;Weight Loss: Understanding of general recommendations for a balanced deficit meal plan, which promotes 1-2 lb weight loss per week and includes a negative energy balance of [phone]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

## 2017-12-12 ENCOUNTER — Emergency Department: Payer: Medicaid Other

## 2017-12-12 ENCOUNTER — Other Ambulatory Visit: Payer: Self-pay

## 2017-12-12 ENCOUNTER — Encounter: Payer: Self-pay | Admitting: Emergency Medicine

## 2017-12-12 ENCOUNTER — Observation Stay
Admission: EM | Admit: 2017-12-12 | Discharge: 2017-12-14 | Disposition: A | Discharge status: Home / Self Care | Payer: Medicaid Other | Attending: Internal Medicine | Admitting: Internal Medicine

## 2017-12-12 DIAGNOSIS — Z7982 Long term (current) use of aspirin: Secondary | ICD-10-CM | POA: Diagnosis not present

## 2017-12-12 DIAGNOSIS — Z79899 Other long term (current) drug therapy: Secondary | ICD-10-CM | POA: Diagnosis not present

## 2017-12-12 DIAGNOSIS — I5040 Unspecified combined systolic (congestive) and diastolic (congestive) heart failure: Secondary | ICD-10-CM | POA: Insufficient documentation

## 2017-12-12 DIAGNOSIS — J9611 Chronic respiratory failure with hypoxia: Secondary | ICD-10-CM | POA: Insufficient documentation

## 2017-12-12 DIAGNOSIS — Y9389 Activity, other specified: Secondary | ICD-10-CM | POA: Insufficient documentation

## 2017-12-12 DIAGNOSIS — Z9981 Dependence on supplemental oxygen: Secondary | ICD-10-CM | POA: Insufficient documentation

## 2017-12-12 DIAGNOSIS — I110 Hypertensive heart disease with heart failure: Secondary | ICD-10-CM | POA: Insufficient documentation

## 2017-12-12 DIAGNOSIS — M1990 Unspecified osteoarthritis, unspecified site: Secondary | ICD-10-CM | POA: Diagnosis not present

## 2017-12-12 DIAGNOSIS — S2232XA Fracture of one rib, left side, initial encounter for closed fracture: Principal | ICD-10-CM | POA: Insufficient documentation

## 2017-12-12 DIAGNOSIS — S2239XA Fracture of one rib, unspecified side, initial encounter for closed fracture: Secondary | ICD-10-CM | POA: Diagnosis present

## 2017-12-12 DIAGNOSIS — J441 Chronic obstructive pulmonary disease with (acute) exacerbation: Secondary | ICD-10-CM | POA: Diagnosis not present

## 2017-12-12 DIAGNOSIS — B029 Zoster without complications: Secondary | ICD-10-CM | POA: Diagnosis not present

## 2017-12-12 DIAGNOSIS — F17210 Nicotine dependence, cigarettes, uncomplicated: Secondary | ICD-10-CM | POA: Diagnosis not present

## 2017-12-12 DIAGNOSIS — Y9289 Other specified places as the place of occurrence of the external cause: Secondary | ICD-10-CM | POA: Insufficient documentation

## 2017-12-12 DIAGNOSIS — I252 Old myocardial infarction: Secondary | ICD-10-CM | POA: Insufficient documentation

## 2017-12-12 DIAGNOSIS — I2510 Atherosclerotic heart disease of native coronary artery without angina pectoris: Secondary | ICD-10-CM | POA: Insufficient documentation

## 2017-12-12 DIAGNOSIS — E119 Type 2 diabetes mellitus without complications: Secondary | ICD-10-CM

## 2017-12-12 DIAGNOSIS — B1920 Unspecified viral hepatitis C without hepatic coma: Secondary | ICD-10-CM | POA: Insufficient documentation

## 2017-12-12 DIAGNOSIS — Z7902 Long term (current) use of antithrombotics/antiplatelets: Secondary | ICD-10-CM | POA: Insufficient documentation

## 2017-12-12 DIAGNOSIS — I10 Essential (primary) hypertension: Secondary | ICD-10-CM | POA: Diagnosis present

## 2017-12-12 DIAGNOSIS — J449 Chronic obstructive pulmonary disease, unspecified: Secondary | ICD-10-CM | POA: Diagnosis present

## 2017-12-12 LAB — BASIC METABOLIC PANEL
Anion gap: 5 (ref 5–15)
BUN: 15 mg/dL (ref 6–20)
CHLORIDE: 102 mmol/L (ref 101–111)
CO2: 30 mmol/L (ref 22–32)
Calcium: 8.3 mg/dL — ABNORMAL LOW (ref 8.9–10.3)
Creatinine, Ser: 0.9 mg/dL (ref 0.61–1.24)
GFR calc Af Amer: >60 mL/min (ref >60)
GLUCOSE: 126 mg/dL — AB (ref 65–99)
Potassium: 4.4 mmol/L (ref 3.5–5.1)
Sodium: 137 mmol/L (ref 135–145)

## 2017-12-12 LAB — CBC
HEMATOCRIT: 36.1 % — AB (ref 40.0–52.0)
HEMOGLOBIN: 11.8 g/dL — AB (ref 13.0–18.0)
MCH: 29.8 pg (ref 26.0–34.0)
MCHC: 32.6 g/dL (ref 32.0–36.0)
MCV: 91.3 fL (ref 80.0–100.0)
Platelets: 228 10*3/uL (ref 150–440)
RBC: 3.96 MIL/uL — ABNORMAL LOW (ref 4.40–5.90)
RDW: 15.4 % — ABNORMAL HIGH (ref 11.5–14.5)
WBC: 9.4 10*3/uL (ref 3.8–10.6)

## 2017-12-12 LAB — TROPONIN I
Troponin I: 0.05 ng/mL (ref <0.03)
Troponin I: 0.06 ng/mL (ref <0.03)

## 2017-12-12 MED ORDER — BENZONATATE 100 MG PO CAPS: 200.0000 mg | ORAL_CAPSULE | Freq: Three times a day (TID) | ORAL | Status: DC | PRN

## 2017-12-12 MED ORDER — METOPROLOL TARTRATE 50 MG PO TABS
50.0000 mg | ORAL_TABLET | Freq: Two times a day (BID) | ORAL | Status: DC
  Administered 2017-12-13 – 2017-12-14 (×3): 50 mg via ORAL
  Filled 2017-12-12 (×3): qty 1

## 2017-12-12 MED ORDER — HYDROMORPHONE HCL 1 MG/ML IJ SOLN
1.0000 mg | Freq: Once | INTRAMUSCULAR | Status: AC
  Administered 2017-12-12: 1 mg via INTRAVENOUS
  Filled 2017-12-12: qty 1

## 2017-12-12 MED ORDER — ACETAMINOPHEN 325 MG PO TABS: 650.0000 mg | ORAL_TABLET | Freq: Four times a day (QID) | ORAL | Status: DC | PRN

## 2017-12-12 MED ORDER — HYDROMORPHONE HCL 1 MG/ML IJ SOLN
0.5000 mg | INTRAMUSCULAR | Status: DC | PRN
  Administered 2017-12-14: 0.5 mg via INTRAVENOUS
  Filled 2017-12-12: qty 1

## 2017-12-12 MED ORDER — LISINOPRIL 5 MG PO TABS
5.0000 mg | ORAL_TABLET | Freq: Every day | ORAL | Status: DC
  Administered 2017-12-13 – 2017-12-14 (×2): 5 mg via ORAL
  Filled 2017-12-12 (×2): qty 1

## 2017-12-12 MED ORDER — IPRATROPIUM-ALBUTEROL 0.5-2.5 (3) MG/3ML IN SOLN
3.0000 mL | Freq: Four times a day (QID) | RESPIRATORY_TRACT | Status: DC | PRN
  Administered 2017-12-13: 3 mL via RESPIRATORY_TRACT
  Filled 2017-12-12 (×2): qty 3

## 2017-12-12 MED ORDER — ALBUTEROL SULFATE (2.5 MG/3ML) 0.083% IN NEBU
2.5000 mg | INHALATION_SOLUTION | Freq: Four times a day (QID) | RESPIRATORY_TRACT | Status: DC | PRN
  Administered 2017-12-12: 2.5 mg via RESPIRATORY_TRACT
  Filled 2017-12-12: qty 3

## 2017-12-12 MED ORDER — MOMETASONE FURO-FORMOTEROL FUM 200-5 MCG/ACT IN AERO
2.0000 | INHALATION_SPRAY | Freq: Two times a day (BID) | RESPIRATORY_TRACT | Status: DC
  Administered 2017-12-12 – 2017-12-14 (×3): 2 via RESPIRATORY_TRACT
  Filled 2017-12-12: qty 8.8

## 2017-12-12 MED ORDER — ASPIRIN EC 81 MG PO TBEC
81.0000 mg | DELAYED_RELEASE_TABLET | Freq: Every day | ORAL | Status: DC
  Administered 2017-12-13 – 2017-12-14 (×2): 81 mg via ORAL
  Filled 2017-12-12 (×2): qty 1

## 2017-12-12 MED ORDER — ISOSORBIDE MONONITRATE ER 30 MG PO TB24
30.0000 mg | ORAL_TABLET | Freq: Every day | ORAL | Status: DC
  Administered 2017-12-13 – 2017-12-14 (×2): 30 mg via ORAL
  Filled 2017-12-12 (×2): qty 1

## 2017-12-12 MED ORDER — ENOXAPARIN SODIUM 40 MG/0.4ML ~~LOC~~ SOLN
40.0000 mg | SUBCUTANEOUS | Status: DC
  Administered 2017-12-12 – 2017-12-13 (×2): 40 mg via SUBCUTANEOUS
  Filled 2017-12-12 (×2): qty 0.4

## 2017-12-12 MED ORDER — ROSUVASTATIN CALCIUM 10 MG PO TABS
40.0000 mg | ORAL_TABLET | Freq: Every day | ORAL | Status: DC
  Administered 2017-12-12: 40 mg via ORAL
  Filled 2017-12-12 (×2): qty 4

## 2017-12-12 MED ORDER — HYDROMORPHONE HCL 1 MG/ML IJ SOLN
1.0000 mg | Freq: Once | INTRAMUSCULAR | Status: AC
Start: 2017-12-12 — End: 2017-12-12
  Administered 2017-12-12: 1 mg via INTRAVENOUS
  Filled 2017-12-12: qty 1

## 2017-12-12 MED ORDER — ONDANSETRON HCL 4 MG PO TABS: 4.0000 mg | ORAL_TABLET | Freq: Four times a day (QID) | ORAL | Status: DC | PRN

## 2017-12-12 MED ORDER — OXYCODONE-ACETAMINOPHEN 5-325 MG PO TABS: 1.0000 | ORAL_TABLET | Freq: Three times a day (TID) | ORAL | 0 refills | Status: DC | PRN

## 2017-12-12 MED ORDER — FAMOTIDINE 20 MG PO TABS
20.0000 mg | ORAL_TABLET | Freq: Two times a day (BID) | ORAL | Status: DC
  Administered 2017-12-13 – 2017-12-14 (×3): 20 mg via ORAL
  Filled 2017-12-12 (×3): qty 1

## 2017-12-12 MED ORDER — VALACYCLOVIR HCL 1 G PO TABS: 2000.0000 mg | ORAL_TABLET | Freq: Three times a day (TID) | ORAL | 0 refills | Status: DC

## 2017-12-12 MED ORDER — BUSPIRONE HCL 10 MG PO TABS
10.0000 mg | ORAL_TABLET | Freq: Two times a day (BID) | ORAL | Status: DC
  Administered 2017-12-12 – 2017-12-14 (×4): 10 mg via ORAL
  Filled 2017-12-12 (×5): qty 1

## 2017-12-12 MED ORDER — GUAIFENESIN-DM 100-10 MG/5ML PO SYRP
5.0000 mL | ORAL_SOLUTION | ORAL | Status: DC | PRN
  Administered 2017-12-12 – 2017-12-14 (×3): 5 mL via ORAL
  Filled 2017-12-12 (×4): qty 5

## 2017-12-12 MED ORDER — INSULIN ASPART 100 UNIT/ML ~~LOC~~ SOLN
0.0000 [IU] | Freq: Every day | SUBCUTANEOUS | Status: DC
  Administered 2017-12-13: 3 [IU] via SUBCUTANEOUS
  Filled 2017-12-12: qty 1

## 2017-12-12 MED ORDER — TIOTROPIUM BROMIDE MONOHYDRATE 18 MCG IN CAPS
1.0000 | ORAL_CAPSULE | Freq: Every day | RESPIRATORY_TRACT | Status: DC
  Administered 2017-12-14: 18 ug via RESPIRATORY_TRACT
  Filled 2017-12-12: qty 5

## 2017-12-12 MED ORDER — ONDANSETRON HCL 4 MG/2ML IJ SOLN: 4.0000 mg | Freq: Four times a day (QID) | INTRAMUSCULAR | Status: DC | PRN

## 2017-12-12 MED ORDER — ACETAMINOPHEN 650 MG RE SUPP: 650.0000 mg | Freq: Four times a day (QID) | RECTAL | Status: DC | PRN

## 2017-12-12 MED ORDER — CLOPIDOGREL BISULFATE 75 MG PO TABS
75.0000 mg | ORAL_TABLET | Freq: Every day | ORAL | Status: DC
  Administered 2017-12-13 – 2017-12-14 (×2): 75 mg via ORAL
  Filled 2017-12-12 (×2): qty 1

## 2017-12-12 MED ORDER — ESCITALOPRAM OXALATE 20 MG PO TABS
30.0000 mg | ORAL_TABLET | Freq: Every day | ORAL | Status: DC
  Administered 2017-12-12 – 2017-12-13 (×2): 30 mg via ORAL
  Filled 2017-12-12 (×3): qty 1

## 2017-12-12 MED ORDER — OXYCODONE HCL 5 MG PO TABS
5.0000 mg | ORAL_TABLET | ORAL | Status: DC | PRN
  Administered 2017-12-12 – 2017-12-13 (×3): 5 mg via ORAL
  Filled 2017-12-12 (×3): qty 1

## 2017-12-12 MED ORDER — INSULIN ASPART 100 UNIT/ML ~~LOC~~ SOLN
0.0000 [IU] | Freq: Three times a day (TID) | SUBCUTANEOUS | Status: DC
  Administered 2017-12-13 – 2017-12-14 (×3): 2 [IU] via SUBCUTANEOUS
  Administered 2017-12-14: 1 [IU] via SUBCUTANEOUS
  Filled 2017-12-12 (×4): qty 1
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
## 2017-12-12 NOTE — ED Provider Notes (Signed)
-----------------------------------------   7:09 PM on 12/12/2017 -----------------------------------------  I took over care on this patient from Dr. Mayford KnifeWilliams.  Per Dr. Mayford KnifeWilliams, the plan was to await a repeat troponin and likely discharge the patient home.  Repeat troponin is 0.06, so in the same range as his prior; there is no evidence of active cardiac etiology.  However, the patient still has quite severe rib pain related to the fracture.  He has had minimal relief with 2 additional doses of Dilaudid.  His O2 sat is 93% on nasal cannula, however he states that even with minimal movement or exertion he has severe pain and he cannot stand or get out of bed.  Therefore, patient will require admission for pain control.  I signed the patient out to the hospitalist Dr. Elisabeth PigeonVachhani.   Dionne BucySiadecki, Wray Goehring, MD 12/12/17 1911
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
## 2017-12-12 NOTE — ED Notes (Signed)
This RN to bedside, pt c/o continued 10/10 pain. Explained will speak to MD regarding more pain medication. Pt states understanding.

## 2017-12-12 NOTE — ED Notes (Signed)
Pt resting in bed. NAD noted at this time. Pt states pain went from 10 to an 8. Pt remains on 2L via  at this time. Will continue to monitor for further patient needs.

## 2017-12-12 NOTE — ED Notes (Signed)
MD notified of patient's response to pain. Pt states that "I feel like I've gotten something for pain, but it just doesn't help, I'm still hurting".

## 2017-12-12 NOTE — ED Notes (Signed)
Date and time results received: 12/12/17 2:59 PM  (use smartphrase ".now" to insert current time)  Test: Trop Critical Value: 0.05  Name of Provider Notified: Dr. Mayford KnifeWilliams  Orders Received? Or Actions Taken?: Critical Results acknolwedged

## 2017-12-12 NOTE — ED Notes (Signed)
Dr. Willis at bedside at this time.  
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
## 2017-12-13 LAB — TROPONIN I
TROPONIN I: 0.06 ng/mL — AB (ref <0.03)
TROPONIN I: 0.05 ng/mL — AB (ref <0.03)
Troponin I: 0.06 ng/mL (ref <0.03)

## 2017-12-13 LAB — GLUCOSE, CAPILLARY
GLUCOSE-CAPILLARY: 117 mg/dL — AB (ref 65–99)
GLUCOSE-CAPILLARY: 177 mg/dL — AB (ref 65–99)
GLUCOSE-CAPILLARY: 91 mg/dL (ref 65–99)
Glucose-Capillary: 158 mg/dL — ABNORMAL HIGH (ref 65–99)
Glucose-Capillary: 276 mg/dL — ABNORMAL HIGH (ref 65–99)

## 2017-12-13 LAB — BASIC METABOLIC PANEL
Anion gap: 6 (ref 5–15)
BUN: 16 mg/dL (ref 6–20)
CALCIUM: 8.2 mg/dL — AB (ref 8.9–10.3)
CO2: 28 mmol/L (ref 22–32)
CREATININE: 0.93 mg/dL (ref 0.61–1.24)
Chloride: 103 mmol/L (ref 101–111)
GFR calc Af Amer: >60 mL/min (ref >60)
GLUCOSE: 120 mg/dL — AB (ref 65–99)
Potassium: 4 mmol/L (ref 3.5–5.1)
Sodium: 137 mmol/L (ref 135–145)

## 2017-12-13 LAB — CBC
HCT: 36.9 % — ABNORMAL LOW (ref 40.0–52.0)
Hemoglobin: 12 g/dL — ABNORMAL LOW (ref 13.0–18.0)
MCH: 29.8 pg (ref 26.0–34.0)
MCHC: 32.4 g/dL (ref 32.0–36.0)
MCV: 91.8 fL (ref 80.0–100.0)
PLATELETS: 203 10*3/uL (ref 150–440)
RBC: 4.02 MIL/uL — ABNORMAL LOW (ref 4.40–5.90)
RDW: 16 % — ABNORMAL HIGH (ref 11.5–14.5)
WBC: 9.1 10*3/uL (ref 3.8–10.6)

## 2017-12-13 MED ORDER — METHYLPREDNISOLONE SODIUM SUCC 125 MG IJ SOLR
60.0000 mg | INTRAMUSCULAR | Status: DC
  Administered 2017-12-13 – 2017-12-14 (×2): 60 mg via INTRAVENOUS
  Filled 2017-12-13 (×2): qty 2

## 2017-12-13 MED ORDER — OXYCODONE HCL 5 MG PO TABS
10.0000 mg | ORAL_TABLET | ORAL | Status: DC | PRN
  Administered 2017-12-13 – 2017-12-14 (×5): 10 mg via ORAL
  Filled 2017-12-13 (×5): qty 2

## 2017-12-13 MED ORDER — IPRATROPIUM-ALBUTEROL 0.5-2.5 (3) MG/3ML IN SOLN: 3.0000 mL | RESPIRATORY_TRACT | Status: DC | PRN

## 2017-12-13 MED ORDER — SENNA 8.6 MG PO TABS
1.0000 | ORAL_TABLET | Freq: Every day | ORAL | Status: DC
  Administered 2017-12-13 – 2017-12-14 (×2): 8.6 mg via ORAL
  Filled 2017-12-13 (×2): qty 1

## 2017-12-13 MED ORDER — POLYETHYLENE GLYCOL 3350 17 G PO PACK
17.0000 g | PACK | Freq: Every day | ORAL | Status: DC
  Administered 2017-12-13 – 2017-12-14 (×2): 17 g via ORAL
  Filled 2017-12-13 (×2): qty 1

## 2017-12-13 MED ORDER — IPRATROPIUM-ALBUTEROL 0.5-2.5 (3) MG/3ML IN SOLN
3.0000 mL | RESPIRATORY_TRACT | Status: DC
  Administered 2017-12-13 (×3): 3 mL via RESPIRATORY_TRACT
  Filled 2017-12-13 (×4): qty 3

## 2017-12-13 MED ORDER — IPRATROPIUM-ALBUTEROL 0.5-2.5 (3) MG/3ML IN SOLN
3.0000 mL | Freq: Four times a day (QID) | RESPIRATORY_TRACT | Status: DC
  Administered 2017-12-14 (×2): 3 mL via RESPIRATORY_TRACT
  Filled 2017-12-13 (×2): qty 3
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
## 2017-12-14 LAB — GLUCOSE, CAPILLARY
GLUCOSE-CAPILLARY: 187 mg/dL — AB (ref 65–99)
Glucose-Capillary: 135 mg/dL — ABNORMAL HIGH (ref 65–99)

## 2017-12-14 MED ORDER — OXYCODONE HCL 10 MG PO TABS: 10.0000 mg | ORAL_TABLET | ORAL | 0 refills | Status: DC | PRN

## 2017-12-14 MED ORDER — BENZONATATE 200 MG PO CAPS: 200.0000 mg | ORAL_CAPSULE | Freq: Three times a day (TID) | ORAL | 0 refills | Status: AC | PRN

## 2017-12-14 MED ORDER — METOPROLOL TARTRATE 50 MG PO TABS: 50.0000 mg | ORAL_TABLET | Freq: Two times a day (BID) | ORAL | 0 refills | Status: AC

## 2017-12-14 MED ORDER — PREDNISONE 20 MG PO TABS: 40.0000 mg | ORAL_TABLET | Freq: Every day | ORAL | 0 refills | Status: DC

## 2017-12-14 NOTE — Care Management Note (Signed)
Case Management Note  Patient Details  Name: Kyle Rich MRN: [id number] Date of Birth: 20-Aug-1956   Patient discharged home today.  Sister brought portable tank for discharge.  Patient agreeable to home health services, and did not have a preference of home health agency.  [facility] Judithann SheenSparks.  [facility]  Previous admission patient was ordered NIV.  Confirmed with Morrie SheldonAshley from [facility] that [name] was obtained yesterday.  Patient to call and arrange for delivery after discharge.  Patient provided with information on reli on prime glucometer.   As supplies will be more cost efficient for him. RNCM has requested $326.80 from the [facility] to cover patients [name] that is due on 4/26.  Pending approval.  RNCM signing off.   Subjective/Objective:                    Action/Plan:   Expected Discharge Date:  12/14/17               Expected Discharge Plan:  Home w Home Health Services  In-House Referral:     Discharge planning Services  Medication Assistance, Other - See comment, CM Consult(Charity request to pay for natural gas)  Post Acute Care Choice:    Choice offered to:  Patient  DME Arranged:    [facility] Agency[facility]  HH Arranged:  RN, PT, Social Work [facility]:  [facility]  Status of Service:  Completed, signed off  If discussed at [employer] Length of Stay Meetings, dates discussed:    Additional Comments:  Kyle Rich, Kyle Doutt T, RN 12/14/2017, 3:37 PM
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
## 2017-12-14 NOTE — Progress Notes (Signed)
A & O. 2 L of oxygen. No tele. IV removed. Prescriptions and Incentive spirometer given to pt. Discharge instructions given to pt. Pt reported pain and received meds. Home health set up. Pt has no further concerns at this time.

## 2017-12-14 NOTE — Clinical Social Work Note (Signed)
CSW spoke with patient in regards to going to SNF for short term rehab.  Patient stated he does not want to go to SNF, he feels like he will be fine back home.  Patient is requesting home health, CSW contacted case manager who is aware.  CSW to sign off please reconsult if other social work needs arise.  Ervin KnackEric R. Hazely Sealey, MSW, Theresia MajorsLCSWA [phone]  12/14/2017 11:30 AM

## 2017-12-15 ENCOUNTER — Telehealth: Payer: Self-pay

## 2017-12-15 DIAGNOSIS — J449 Chronic obstructive pulmonary disease, unspecified: Secondary | ICD-10-CM

## 2017-12-15 NOTE — Telephone Encounter (Signed)
Called to let pt know we had received his clearance to return.  Unable to leave message on mobile and home.

## 2017-12-15 NOTE — Progress Notes (Signed)
Pulmonary Individual Treatment Plan  Patient Details  Name: Kyle Rich MRN: [id number] Date of Birth: 02-23-1956 Referring Provider:     Pulmonary Rehab from 09/11/2017 in [facility] Cardiac [facility] Pulmonary Rehab  Referring Provider  Salome Holmes MD      Initial Encounter Date:    Pulmonary Rehab from 09/11/2017 in [facility] Cardiac and Pulmonary Rehab  Date  09/11/17  Referring Provider  Salome Holmes MD      Visit Diagnosis: Chronic obstructive pulmonary disease, unspecified COPD type ([place])  Patient's Home Medications on Admission:  Current Outpatient Medications:  .  albuterol (PROAIR HFA) 108 (90 BASE) MCG/ACT inhaler, Inhale 2 puffs into the lungs every 6 (six) hours as needed for wheezing. , Disp: , Rfl:  .  aspirin EC 81 MG tablet, Take 81 mg by mouth daily., Disp: , Rfl:  .  benzonatate (TESSALON) 200 MG capsule, Take 1 capsule (200 mg total) by mouth 3 (three) times daily as needed for cough., Disp: 20 capsule, Rfl: 0 .  busPIRone (BUSPAR) 10 MG tablet, Take 10 mg by mouth 2 (two) times daily., Disp: , Rfl:  .  clopidogrel (PLAVIX) 75 MG tablet, Take 75 mg by mouth daily., Disp: , Rfl: 11 .  [employer] SENNA 8.6 MG tablet, Take 1 tablet by mouth daily., Disp: , Rfl: 11 .  escitalopram (LEXAPRO) 20 MG tablet, Take 1 tablet (20 mg total) at bedtime by mouth. (Patient taking differently: Take 30 mg by mouth at bedtime. ), Disp: 30 tablet, Rfl: 2 .  ezetimibe (ZETIA) 10 MG tablet, Take 10 mg by mouth daily., Disp: , Rfl: 11 .  Fluticasone-Salmeterol (ADVAIR DISKUS) 500-50 MCG/DOSE AEPB, Inhale 1 puff every 12 (twelve) hours into the lungs., Disp: 60 each, Rfl: 2 .  gabapentin (NEURONTIN) 300 MG capsule, Take 300 mg by mouth 3 (three) times daily., Disp: , Rfl:  .  ipratropium-albuterol (DUONEB) 0.5-2.5 (3) MG/3ML SOLN, Take 3 mLs every 6 (six) hours as needed by nebulization (wheezing)., Disp: 360 mL, Rfl: 1 .  isosorbide mononitrate (IMDUR) 30 MG 24 hr tablet, Take 30 mg by mouth daily., Disp: ,  Rfl:  .  lisinopril (PRINIVIL,ZESTRIL) 5 MG tablet, Take 1 tablet (5 mg total) by mouth daily., Disp: 30 tablet, Rfl: 1 .  metFORMIN (GLUCOPHAGE) 500 MG tablet, Take 500 mg by mouth 2 (two) times daily. , Disp: , Rfl:  .  metoprolol tartrate (LOPRESSOR) 50 MG tablet, Take 1 tablet (50 mg total) by mouth 2 (two) times daily., Disp: 30 tablet, Rfl: 0 .  nicotine (NICODERM CQ - DOSED IN MG/24 HOURS) 14 mg/24hr patch, Place 1 patch (14 mg total) daily onto the skin. (Patient not taking: Reported on 11/25/2017), Disp: 28 patch, Rfl: 0 .  nitroGLYCERIN (NITROSTAT) 0.4 MG SL tablet, Place 0.4 mg under the tongue every 5 (five) minutes x 3 doses as needed for chest pain., Disp: , Rfl: 0 .  oxyCODONE 10 MG TABS, Take 1 tablet (10 mg total) by mouth every 4 (four) hours as needed for moderate pain., Disp: 30 tablet, Rfl: 0 .  pantoprazole (PROTONIX) 40 MG tablet, Take 1 tablet (40 mg total) daily by mouth. (Patient not taking: Reported on 12/12/2017), Disp: 30 tablet, Rfl: 2 .  predniSONE (DELTASONE) 20 MG tablet, Take 2 tablets (40 mg total) by mouth daily with breakfast for 4 days., Disp: 8 tablet, Rfl: 0 .  ranitidine (ZANTAC) 150 MG tablet, Take 150 mg by mouth 2 (two) times daily., Disp: , Rfl:  .  rosuvastatin (CRESTOR) 40 MG tablet, Take 40 mg by mouth daily., Disp: , Rfl:  .  tiotropium (SPIRIVA HANDIHALER) 18 MCG inhalation capsule, Place 1 capsule (18 mcg total) daily into inhaler and inhale., Disp: 30 capsule, Rfl: 2  Past Medical History: Past Medical History:  Diagnosis Date  . ACS (acute coronary syndrome) (HCC)    unstable angina in 2017 leading to catheterization/PCI by Dr. Clayborn Bigness  . Arthritis   . Asthma   . CHF (congestive heart failure) ([place])   . COPD (chronic obstructive pulmonary disease) ([place])   . Diabetes mellitus without complication ([place])   . Hepatitis C   . Hypertension     Tobacco Use: Social History   Tobacco Use  Smoking Status Current Every Day Smoker  . Packs/day:  1.00  . Years: 40.00  . Pack years: 40.00  . Types: Cigarettes  Smokeless Tobacco Never Used  Tobacco Comment   he is interested but doesnt want to quit due to him mother passing away    Labs: Recent Review Flowsheet Data    Labs for ITP Cardiac and Pulmonary Rehab Latest Ref Rng & Units 07/02/2017 07/03/2017 07/19/2017 11/24/2017 11/25/2017   Cholestrol 0 - 200 mg/dL - - - - 96   LDLCALC 0 - 99 mg/dL - - - - 49   HDL >40 mg/dL - - - - 31(L)   Trlycerides <150 mg/dL - - - - 79   Hemoglobin A1c 4.8 - 5.6 % - 6.6(H) - 6.0(H) -   PHART 7.350 - 7.450 7.41 7.38 - 7.30(L) -   PCO2ART 32.0 - 48.0 mmHg 50(H) 48 - 69(HH) -   HCO3 20.0 - 28.0 mmol/L 31.7(H) 28.4(H) 40.3(H) 34.0(H) -   O2SAT % 98.3 93.4 96.1 99.2 -       Pulmonary Assessment Scores: Pulmonary Assessment Scores    Row Name 09/11/17 1410         ADL UCSD   ADL Phase  Entry     SOB Score total  84     Rest  3     Walk  3     Stairs  5     Bath  4     Dress  3     Shop  4       CAT Score   CAT Score  27       mMRC Score   mMRC Score  3        Pulmonary Function Assessment: Pulmonary Function Assessment - 09/11/17 1428      Initial Spirometry Results   FVC%  33 %    FEV1%  27 %    FEV1/FVC Ratio  60.27    Comments  Best of 2, good patient effort      Post Bronchodilator Spirometry Results   FVC%  38.94 %    FEV1%  32.94 %    FEV1/FVC Ratio  62.12    Comments  Best of 2, good patient effort      Breath   Bilateral Breath Sounds  Wheezes    Shortness of Breath  Limiting activity;Fear of Shortness of Breath;Panic with Shortness of Breath;Yes       Exercise Target Goals:    Exercise Program Goal: Individual exercise prescription set using results from initial 6 min walk test and THRR while considering  patient's activity barriers and safety.    Exercise Prescription Goal: Initial exercise prescription builds to 30-45 minutes a day of aerobic activity, 2-3 days per week.  Home exercise  guidelines will be given to patient during program as part of exercise prescription that the participant will acknowledge.  Activity Barriers & Risk Stratification: Activity Barriers & Cardiac Risk Stratification - 09/11/17 1539      Activity Barriers & Cardiac Risk Stratification   Activity Barriers  Deconditioning;Muscular Weakness;Shortness of Breath;Joint Problems;Balance Concerns bilateral hip pain       6 Minute Walk: 6 Minute Walk    Row Name 09/11/17 1530         6 Minute Walk   Phase  Initial     Distance  570 feet     Walk Time  4.65 minutes     # of Rest Breaks  2 1:20, stopped at 5:01     MPH  1.39     METS  1.59     RPE  13     Perceived Dyspnea   2     VO2 Peak  5.55     Symptoms  Yes (comment)     Comments  leg fatigue/heaviness/pain 10/10     Resting HR  67 bpm     Resting BP  146/76     Resting Oxygen Saturation   94 %     Exercise Oxygen Saturation  during 6 min walk  93 %     Max Ex. HR  85 bpm     Max Ex. BP  154/74     2 Minute Post BP  144/70       Interval HR   1 Minute HR  70     2 Minute HR  74     3 Minute HR  74     4 Minute HR  78     5 Minute HR  85     6 Minute HR  75     2 Minute Post HR  66     Interval Heart Rate?  Yes       Interval Oxygen   Interval Oxygen?  Yes     Baseline Oxygen Saturation %  94 %     1 Minute Oxygen Saturation %  93 %     1 Minute Liters of Oxygen  2 L     2 Minute Oxygen Saturation %  94 %     2 Minute Liters of Oxygen  2 L     3 Minute Oxygen Saturation %  94 %     3 Minute Liters of Oxygen  2 L     4 Minute Oxygen Saturation %  95 %     4 Minute Liters of Oxygen  2 L     5 Minute Oxygen Saturation %  95 %     5 Minute Liters of Oxygen  2 L     6 Minute Oxygen Saturation %  95 %     6 Minute Liters of Oxygen  2 L     2 Minute Post Oxygen Saturation %  96 %     2 Minute Post Liters of Oxygen  2 L       Oxygen Initial Assessment: Oxygen Initial Assessment - 09/11/17 1419      Home Oxygen    Home Oxygen Device  Home Concentrator;E-Tanks    Sleep Oxygen Prescription  Continuous    Liters per minute  2    Home Exercise Oxygen Prescription  Continuous    Liters per minute  2    Home at Rest Exercise Oxygen  Prescription  Continuous    Liters per minute  2    Compliance with Home Oxygen Use  Yes      Initial 6 min Walk   Oxygen Used  Continuous    Liters per minute  2      Program Oxygen Prescription   Program Oxygen Prescription  Continuous    Liters per minute  2      Intervention   Short Term Goals  To learn and demonstrate proper use of respiratory medications;To learn and demonstrate proper pursed lip breathing techniques or other breathing techniques.;To learn and understand importance of monitoring SPO2 with pulse oximeter and demonstrate accurate use of the pulse oximeter.;To learn and understand importance of maintaining oxygen saturations>88%;To learn and exhibit compliance with exercise, home and travel O2 prescription takes albuterol nebulizer, pro air inhaler. Advair and spiriva    Long  Term Goals  Exhibits compliance with exercise, home and travel O2 prescription;Verbalizes importance of monitoring SPO2 with pulse oximeter and return demonstration;Maintenance of O2 saturations>88%;Exhibits proper breathing techniques, such as pursed lip breathing or other method taught during program session;Compliance with respiratory medication;Demonstrates proper use of MDI's       Oxygen Re-Evaluation: Oxygen Re-Evaluation    Row Name 09/18/17 1125 10/13/17 1212 11/08/17 1217         Program Oxygen Prescription   Program Oxygen Prescription  Continuous  Continuous  Continuous     Liters per minute  2  2  2        Home Oxygen   Home Oxygen Device  Home Concentrator;E-Tanks  Home Concentrator;E-Tanks  Home Concentrator;E-Tanks     Sleep Oxygen Prescription  Continuous  Continuous  Continuous     Liters per minute  2  2  2      Home Exercise Oxygen Prescription  Continuous   Continuous  Continuous     Liters per minute  2  2  2      Home at Rest Exercise Oxygen Prescription  Continuous  Continuous  Continuous     Liters per minute  2  -  2     Compliance with Home Oxygen Use  Yes  Yes  Yes       Goals/Expected Outcomes   Short Term Goals  To learn and demonstrate proper use of respiratory medications;To learn and demonstrate proper pursed lip breathing techniques or other breathing techniques.;To learn and understand importance of monitoring SPO2 with pulse oximeter and demonstrate accurate use of the pulse oximeter.;To learn and understand importance of maintaining oxygen saturations>88%;To learn and exhibit compliance with exercise, home and travel O2 prescription  To learn and demonstrate proper use of respiratory medications;To learn and demonstrate proper pursed lip breathing techniques or other breathing techniques.;To learn and understand importance of monitoring SPO2 with pulse oximeter and demonstrate accurate use of the pulse oximeter.;To learn and understand importance of maintaining oxygen saturations>88%;To learn and exhibit compliance with exercise, home and travel O2 prescription  To learn and demonstrate proper use of respiratory medications;To learn and demonstrate proper pursed lip breathing techniques or other breathing techniques.;To learn and understand importance of monitoring SPO2 with pulse oximeter and demonstrate accurate use of the pulse oximeter.;To learn and understand importance of maintaining oxygen saturations>88%;To learn and exhibit compliance with exercise, home and travel O2 prescription     Long  Term Goals  Exhibits compliance with exercise, home and travel O2 prescription;Verbalizes importance of monitoring SPO2 with pulse oximeter and return demonstration;Maintenance of O2 saturations>88%;Exhibits proper breathing techniques, such as  pursed lip breathing or other method taught during program session;Compliance with respiratory  medication;Demonstrates proper use of MDI's  Exhibits compliance with exercise, home and travel O2 prescription;Verbalizes importance of monitoring SPO2 with pulse oximeter and return demonstration;Maintenance of O2 saturations>88%;Exhibits proper breathing techniques, such as pursed lip breathing or other method taught during program session;Compliance with respiratory medication;Demonstrates proper use of MDI's  Exhibits compliance with exercise, home and travel O2 prescription;Verbalizes importance of monitoring SPO2 with pulse oximeter and return demonstration;Maintenance of O2 saturations>88%;Exhibits proper breathing techniques, such as pursed lip breathing or other method taught during program session;Compliance with respiratory medication;Demonstrates proper use of MDI's     Comments  Reviewed PLB technique with pt.  Talked about how it work and it's important to maintaining his exercise saturations.    Saunders states that he checks his oxygen twice a day at home. He has been working on PLB. He uses his nebulizer 3-4 times a day. He is wanting to quit smoking but states not that much. Gave patient information about quitting smoking.  He has been moving recently and was out of class for a little while. He states he can tell that he is a little more short of breath. He states when he checked his oxygen at home when he went and got the mail his oxygen level was 88 percent. He would like to try to use 1 liter of oxygen his next session.     Goals/Expected Outcomes  Short: Become more profiecient at using PLB.   Long: Become independent at using PLB.  Short: work on decreasing how many cigarettes he smokes. Long: Quit smoking.  Short: turn oxygen down 1 liter for exercise. Long: decrease oxygen to room air.        Oxygen Discharge (Final Oxygen Re-Evaluation): Oxygen Re-Evaluation - 11/08/17 1217      Program Oxygen Prescription   Program Oxygen Prescription  Continuous    Liters per minute  2       Home Oxygen   Home Oxygen Device  Home Concentrator;E-Tanks    Sleep Oxygen Prescription  Continuous    Liters per minute  2    Home Exercise Oxygen Prescription  Continuous    Liters per minute  2    Home at Rest Exercise Oxygen Prescription  Continuous    Liters per minute  2    Compliance with Home Oxygen Use  Yes      Goals/Expected Outcomes   Short Term Goals  To learn and demonstrate proper use of respiratory medications;To learn and demonstrate proper pursed lip breathing techniques or other breathing techniques.;To learn and understand importance of monitoring SPO2 with pulse oximeter and demonstrate accurate use of the pulse oximeter.;To learn and understand importance of maintaining oxygen saturations>88%;To learn and exhibit compliance with exercise, home and travel O2 prescription    Long  Term Goals  Exhibits compliance with exercise, home and travel O2 prescription;Verbalizes importance of monitoring SPO2 with pulse oximeter and return demonstration;Maintenance of O2 saturations>88%;Exhibits proper breathing techniques, such as pursed lip breathing or other method taught during program session;Compliance with respiratory medication;Demonstrates proper use of MDI's    Comments  He has been moving recently and was out of class for a little while. He states he can tell that he is a little more short of breath. He states when he checked his oxygen at home when he went and got the mail his oxygen level was 88 percent. He would like to try to use 1 liter of  oxygen his next session.    Goals/Expected Outcomes  Short: turn oxygen down 1 liter for exercise. Long: decrease oxygen to room air.       Initial Exercise Prescription: Initial Exercise Prescription - 09/11/17 1500      Date of Initial Exercise RX and Referring Provider   Date  09/11/17    Referring Provider  Salome Holmes MD      Oxygen   Oxygen  Continuous    Liters  2      Treadmill   MPH  1    Grade  0    Minutes   15    METs  1.77      NuStep   Level  1    SPM  80    Minutes  15    METs  1.7      Arm Ergometer   Level  1    Watts  22    RPM  25    Minutes  15    METs  1.7      Prescription Details   Frequency (times per week)  3    Duration  Progress to 45 minutes of aerobic exercise without signs/symptoms of physical distress      Intensity   THRR 40-80% of Max Heartrate  103-139    Ratings of Perceived Exertion  11-15    Perceived Dyspnea  0-4      Progression   Progression  Continue to progress workloads to maintain intensity without signs/symptoms of physical distress.      Resistance Training   Training Prescription  Yes    Weight  3 lbs    Reps  10-15       Perform Capillary Blood Glucose checks as needed.  Exercise Prescription Changes: Exercise Prescription Changes    Row Name 09/11/17 1500 09/19/17 1600 10/04/17 1200 10/17/17 1500 10/31/17 1200     Response to Exercise   Blood Pressure (Admit)  146/76  104/64  126/62  130/80  134/72   Blood Pressure (Exercise)  154/74  132/76  -  -  -   Blood Pressure (Exit)  144/70  -  112/70  142/82  130/68   Heart Rate (Admit)  67 bpm  61 bpm  70 bpm  62 bpm  68 bpm   Heart Rate (Exercise)  85 bpm  79 bpm  86 bpm  65 bpm  68 bpm   Heart Rate (Exit)  66 bpm  -  66 bpm  62 bpm  60 bpm   Oxygen Saturation (Admit)  94 %  95 %  93 %  95 %  95 %   Oxygen Saturation (Exercise)  93 %  94 %  94 %  98 %  97 %   Oxygen Saturation (Exit)  96 %  -  96 %  95 %  96 %   Rating of Perceived Exertion (Exercise)  13  13  12  15  13    Perceived Dyspnea (Exercise)  2  4  1  3  2    Symptoms  leg fatigue/heaviness/pain 10/10  SOB, fatigue  none  none  none   Comments  walk test results  first full day of exercise  -  -  -   Duration  -  Progress to 45 minutes of aerobic exercise without signs/symptoms of physical distress  Continue with 45 min of aerobic exercise without signs/symptoms of physical distress.  Continue with 45 min of aerobic exercise  without signs/symptoms of physical distress.  Continue with 45 min of aerobic exercise without signs/symptoms of physical distress.   Intensity  -  THRR unchanged  THRR unchanged  THRR unchanged  THRR unchanged     Progression   Progression  -  Continue to progress workloads to maintain intensity without signs/symptoms of physical distress.  Continue to progress workloads to maintain intensity without signs/symptoms of physical distress.  Continue to progress workloads to maintain intensity without signs/symptoms of physical distress.  Continue to progress workloads to maintain intensity without signs/symptoms of physical distress.   Average METs  -  1.65  2  2.02  2.24     Resistance Training   Training Prescription  -  Yes  Yes  Yes  Yes   Weight  -  3 lbs  3 lbs  4 lbs  4 lbs   Reps  -  10-15  10-15  10-15  10-15     Interval Training   Interval Training  -  No  No  No  No     Oxygen   Oxygen  -  Continuous  Continuous  Continuous  Continuous   Liters  -  2  2  2  2      Treadmill   MPH  -  1  0.8  1  1.2   Grade  -  0  0  0  0   Minutes  -  15  15  15  15    METs  -  1.77  1.7  1.77  1.92     NuStep   Level  -  1  2  4  6    SPM  -  49  84  84  60   Minutes  -  15  15  15  15    METs  -  1.6  2.1  2.2  2.5     Arm Ergometer   Level  -  -  1  1  2    RPM  -  -  36  -  43   Minutes  -  -  15  15  15    METs  -  -  2.2  2.1  2.3     Home Exercise Plan   Plans to continue exercise at  -  -  Home (comment) walking with dog and at stores  Home (comment) walking with dog and at stores  Home (comment) walking with dog and at stores   Frequency  -  -  Add 1 additional day to program exercise sessions.  Add 1 additional day to program exercise sessions.  Add 1 additional day to program exercise sessions.   Initial Home Exercises Provided  -  -  10/04/17  10/04/17  10/04/17   Row Name 11/14/17 1600             Response to Exercise   Blood Pressure (Admit)  124/82       Blood  Pressure (Exit)  122/80       Heart Rate (Admit)  115 bpm       Heart Rate (Exercise)  83 bpm       Heart Rate (Exit)  67 bpm       Oxygen Saturation (Admit)  94 %       Oxygen Saturation (Exercise)  93 %       Oxygen Saturation (Exit)  97 %       Rating of Perceived  Exertion (Exercise)  13       Perceived Dyspnea (Exercise)  3       Symptoms  SOB       Duration  Continue with 45 min of aerobic exercise without signs/symptoms of physical distress.       Intensity  THRR unchanged         Progression   Progression  Continue to progress workloads to maintain intensity without signs/symptoms of physical distress.       Average METs  2.12         Resistance Training   Training Prescription  Yes       Weight  10 lbs       Reps  10-15         Interval Training   Interval Training  No         Oxygen   Oxygen  Continuous       Liters  2         Treadmill   MPH  1       Grade  0       Minutes  15       METs  1.77         NuStep   Level  7       SPM  60       Minutes  15       METs  2.5         Arm Ergometer   Level  2       Minutes  15       METs  2.1         Home Exercise Plan   Plans to continue exercise at  Home (comment) walking with dog and at stores       Frequency  Add 2 additional days to program exercise sessions.       Initial Home Exercises Provided  10/04/17          Exercise Comments: Exercise Comments    Row Name 09/18/17 1124           Exercise Comments  First full day of exercise!  Patient was oriented to gym and equipment including functions, settings, policies, and procedures.  Patient's individual exercise prescription and treatment plan were reviewed.  All starting workloads were established based on the results of the 6 minute walk test done at initial orientation visit.  The plan for exercise progression was also introduced and progression will be customized based on patient's performance and goals.          Exercise Goals and  Review: Exercise Goals    Row Name 09/11/17 1544             Exercise Goals   Increase Physical Activity  Yes       Intervention  Provide advice, education, support and counseling about physical activity/exercise needs.;Develop an individualized exercise prescription for aerobic and resistive training based on initial evaluation findings, risk stratification, comorbidities and participant's personal goals.       Expected Outcomes  Achievement of increased cardiorespiratory fitness and enhanced flexibility, muscular endurance and strength shown through measurements of functional capacity and personal statement of participant.       Increase Strength and Stamina  Yes       Intervention  Provide advice, education, support and counseling about physical activity/exercise needs.;Develop an individualized exercise prescription for aerobic and resistive training based on initial evaluation findings, risk stratification, comorbidities and participant's  personal goals.       Expected Outcomes  Achievement of increased cardiorespiratory fitness and enhanced flexibility, muscular endurance and strength shown through measurements of functional capacity and personal statement of participant.       Able to understand and use rate of perceived exertion (RPE) scale  Yes       Intervention  Provide education and explanation on how to use RPE scale       Expected Outcomes  Short Term: Able to use RPE daily in rehab to express subjective intensity level;Long Term:  Able to use RPE to guide intensity level when exercising independently       Able to understand and use Dyspnea scale  Yes       Intervention  Provide education and explanation on how to use Dyspnea scale       Expected Outcomes  Short Term: Able to use Dyspnea scale daily in rehab to express subjective sense of shortness of breath during exertion;Long Term: Able to use Dyspnea scale to guide intensity level when exercising independently       Knowledge  and understanding of Target Heart Rate Range (THRR)  Yes       Intervention  Provide education and explanation of THRR including how the numbers were predicted and where they are located for reference       Expected Outcomes  Short Term: Able to state/look up THRR;Long Term: Able to use THRR to govern intensity when exercising independently;Short Term: Able to use daily as guideline for intensity in rehab       Able to check pulse independently  Yes       Intervention  Provide education and demonstration on how to check pulse in carotid and radial arteries.;Review the importance of being able to check your own pulse for safety during independent exercise       Expected Outcomes  Short Term: Able to explain why pulse checking is important during independent exercise;Long Term: Able to check pulse independently and accurately       Understanding of Exercise Prescription  Yes       Intervention  Provide education, explanation, and written materials on patient's individual exercise prescription       Expected Outcomes  Short Term: Able to explain program exercise prescription;Long Term: Able to explain home exercise prescription to exercise independently          Exercise Goals Re-Evaluation : Exercise Goals Re-Evaluation    [place] Name 09/18/17 1124 09/19/17 1612 10/04/17 1223 10/17/17 1456 10/31/17 1246     Exercise Goal Re-Evaluation   Exercise Goals Review  Understanding of Exercise Prescription;Able to understand and use Dyspnea scale;Knowledge and understanding of Target Heart Rate Range (THRR);Able to understand and use rate of perceived exertion (RPE) scale  Increase Physical Activity;Increase Strength and Stamina;Understanding of Exercise Prescription  Increase Physical Activity;Increase Strength and Stamina;Understanding of Exercise Prescription;Able to understand and use rate of perceived exertion (RPE) scale;Able to check pulse independently;Knowledge and understanding of Target Heart Rate  Range (THRR);Able to understand and use Dyspnea scale  Increase Physical Activity;Increase Strength and Stamina;Understanding of Exercise Prescription  Increase Physical Activity;Increase Strength and Stamina;Understanding of Exercise Prescription   Comments  Reviewed RPE scale, THR and program prescription with pt today.  Pt voiced understanding and was given a copy of goals to take home.   Simona Huh has completed his first full day of exercise.  We will continue to monitor his progression.   Simona Huh is off to a good start  in rehab.  He is already feeling stronger and has more stamina.  He says that he is feeling better overall.  Currently, he is not doing any home exercise so we talked about finding ways to carve out some time to get his home exercise.  Reviewed home exercise with pt today.  Pt plans to walk at home with dog and stores when doing his grocery shopping for exercise.  Reviewed THR, pulse, RPE, sign and symptoms, and when to call 911 or MD.  Also discussed weather considerations and indoor options.  Pt voiced understanding.  Simona Huh has been doing well in rehab. He now up to 1.0 mph on the treadmill!  We will continue to encourage him to walk more at home.   We will continue to monitor his pogress.  Simona Huh has been out since 2/15.  He had worked up to level 2 on the arm crank.  Once he returns, we will continue to work on increasing workloads.  We will continue to monitor his progression.    Expected Outcomes  Short: Use RPE daily to regulate intensity.  Long: Follow program prescription in THR.  Short: Attend rehab regularly.  Long: Follow program prescription.   Short: Add in at least one extra day at week at home.  Long: Continue to increase physical activity.   Short: Increase speed on treadmill.  Long: Continue to increase activity at home.   Short: Return to rehab regularly.  Long: Continue to try to increase activity at home to build strength and stamina.   [place] Name 11/08/17 1220 11/14/17 1603  11/29/17 1515 12/11/17 1630       Exercise Goal Re-Evaluation   Exercise Goals Review  Increase Physical Activity;Increase Strength and Stamina  Increase Physical Activity;Increase Strength and Stamina;Understanding of Exercise Prescription  -  -    Comments  Simona Huh is going to try to use 1 liter of oxygen while doing his exercises. He will try to exercise at home but he is in the process of moving. He wants to increase his strength and stamina.  Simona Huh has  been doing well in rehab.  He is now up to level 2 on the arm crank.  He is also using 10 lbs handweights!!.  We will continue to monitor his progression and remind him to try to exercise on 1L of oxygen.   Out since last review  Out since last review    Expected Outcomes  Short: keep oxygen saturation above 88 percent while exercising. Long: maintain oxygen sats above 88 percent on room air post [facility].  Short: Increase workload on stepper.   Long: Continue to work on Printmaker and stamina.   -  -       Discharge Exercise Prescription (Final Exercise Prescription Changes): Exercise Prescription Changes - 11/14/17 1600      Response to Exercise   Blood Pressure (Admit)  124/82    Blood Pressure (Exit)  122/80    Heart Rate (Admit)  115 bpm    Heart Rate (Exercise)  83 bpm    Heart Rate (Exit)  67 bpm    Oxygen Saturation (Admit)  94 %    Oxygen Saturation (Exercise)  93 %    Oxygen Saturation (Exit)  97 %    Rating of Perceived Exertion (Exercise)  13    Perceived Dyspnea (Exercise)  3    Symptoms  SOB    Duration  Continue with 45 min of aerobic exercise without signs/symptoms of physical distress.  Intensity  THRR unchanged      Progression   Progression  Continue to progress workloads to maintain intensity without signs/symptoms of physical distress.    Average METs  2.12      Resistance Training   Training Prescription  Yes    Weight  10 lbs    Reps  10-15      Interval Training   Interval Training  No       Oxygen   Oxygen  Continuous    Liters  2      Treadmill   MPH  1    Grade  0    Minutes  15    METs  1.77      NuStep   Level  7    SPM  60    Minutes  15    METs  2.5      Arm Ergometer   Level  2    Minutes  15    METs  2.1      Home Exercise Plan   Plans to continue exercise at  Home (comment) walking with dog and at stores    Frequency  Add 2 additional days to program exercise sessions.    Initial Home Exercises Provided  10/04/17       Nutrition:  Target Goals: Understanding of nutrition guidelines, daily intake of sodium <1529m, cholesterol <2051m calories 30% from fat and 7% or less from saturated fats, daily to have 5 or more servings of fruits and vegetables.  Biometrics: Pre Biometrics - 09/11/17 1545      Pre Biometrics   Height  5' 3.5" (1.613 m)    Weight  196 lb 11.2 oz (89.2 kg)    Waist Circumference  43 inches    Hip Circumference  42 inches    Waist to Hip Ratio  1.02 %    BMI (Calculated)  34.29        Nutrition Therapy Plan and Nutrition Goals: Nutrition Therapy & Goals - 09/11/17 1409      Personal Nutrition Goals   Comments  Patients wants to lose weight and eat healthier. He would like to meet with the dietician.      Intervention Plan   Intervention  Prescribe, educate and counsel regarding individualized specific dietary modifications aiming towards targeted core components such as weight, hypertension, lipid management, diabetes, heart failure and other comorbidities.;Nutrition handout(s) given to patient.    Expected Outcomes  Short Term Goal: Understand basic principles of dietary content, such as calories, fat, sodium, cholesterol and nutrients.;Long Term Goal: Adherence to prescribed nutrition plan.       Nutrition Assessments:   Nutrition Goals Re-Evaluation: Nutrition Goals Re-Evaluation    Ro[place] 10/13/17 1158 11/08/17 1223           Goals   Current Weight  192 lb (87.1 kg)  193 lb (87.5 kg)      Nutrition  Goal  Lose wieght, eat healthier and Meet with the Dietician.  Lose some weight, eat healthier meals and Meet with the dietician.      Comment  JaConlinants to eat healthier and is willing to make changes in his diet. Money is a factor in his eating decisions. Made an appointment to meet with the dietician on Feb 18th   Made an appointment with the dietician on March 27th.       Expected Outcome  Short: meet with the dietician. Long: adhere to a diet plan.   Short:  meet with the dietician. Long: adhere to a diet plan.         Nutrition Goals Discharge (Final Nutrition Goals Re-Evaluation): Nutrition Goals Re-Evaluation - 11/08/17 1223      Goals   Current Weight  193 lb (87.5 kg)    Nutrition Goal  Lose some weight, eat healthier meals and Meet with the dietician.    Comment  Made an appointment with the dietician on March 27th.     Expected Outcome  Short: meet with the dietician. Long: adhere to a diet plan.       Psychosocial: Target Goals: Acknowledge presence or absence of significant depression and/or stress, maximize coping skills, provide positive support system. Participant is able to verbalize types and ability to use techniques and skills needed for reducing stress and depression.   Initial Review & Psychosocial Screening: Initial Psych Review & Screening - 09/11/17 1404      Initial Review   Current issues with  Current Depression;History of Depression;Current Psychotropic Meds;Current Sleep Concerns;Current Stress Concerns    Source of Stress Concerns  Chronic Illness;Family;Unable to perform yard/household activities;Unable to participate in former interests or hobbies    Comments  His mother died about three weeks ago and it has  been really hard on him.      Family Dynamics   Good Support System?  No    Strains  Intra-family strains    Comments  He blames his sister for putting his mom in a home. He feels like she was not getting the care she needed when she was in the  home and thats what killed her.      Barriers   Psychosocial barriers to participate in program  The patient should benefit from training in stress management and relaxation.      Screening Interventions   Interventions  Yes;Encouraged to exercise;Program counselor consult;To provide support and resources with identified psychosocial needs;Provide feedback about the scores to participant    Expected Outcomes  Short Term goal: Utilizing psychosocial counselor, staff and physician to assist with identification of specific Stressors or current issues interfering with healing process. Setting desired goal for each stressor or current issue identified.;Long Term Goal: Stressors or current issues are controlled or eliminated.;Short Term goal: Identification and review with participant of any Quality of Life or Depression concerns found by scoring the questionnaire.;Long Term goal: The participant improves quality of Life and PHQ9 Scores as seen by post scores and/or verbalization of changes       Quality of Life Scores:  Scores of 19 and below usually indicate a poorer quality of life in these areas.  A difference of  2-3 points is a clinically meaningful difference.  A difference of 2-3 points in the total score of the Quality of Life Index has been associated with significant improvement in overall quality of life, self-image, physical symptoms, and general health in studies assessing change in quality of life.  PHQ-9: Recent Review Flowsheet Data    Depression screen [facility] 2/9 09/11/2017   Decreased Interest 2   Down, Depressed, Hopeless 2   PHQ - 2 Score 4   Altered sleeping 3   Tired, decreased energy 3   Change in appetite 2   Feeling bad or failure about yourself  3   Trouble concentrating 2   Moving slowly or fidgety/restless 1   Suicidal thoughts 2    PHQ-9 Score 20   Difficult doing work/chores Somewhat difficult     Interpretation of Total Score  Total Score Depression Severity:   1-4 = Minimal depression, 5-9 = Mild depression, 10-14 = Moderate depression, 15-19 = Moderately severe depression, 20-27 = Severe depression   Psychosocial Evaluation and Intervention: Psychosocial Evaluation - 10/02/17 1225      Psychosocial Evaluation & Interventions   Interventions  Encouraged to exercise with the program and follow exercise prescription;Stress management education;Relaxation education    Comments  Counselor met with Mr. Dewey Palma) today for initial psychosocial evaluation.  He is a 62 year old who has COPD and a recent heart attack and stents inserted.  Elva also was recently diagnosed with diabetes as well.  He has a limited support system with a daughter and her child who lives with him, and some involvement in his local church.  His mother was living with him until she passed recently and there is some conflict with his siblings surrounding that currently.  Aydenn reports not sleeping well but having a good appetite.  Carrell admits to depression currently as he states his mother was his "best friend".  He has been seeing a psychiatrist in [facility] for these mood issues.  He is also on several meds for depression and anxiety - however his current PHQ-9 is "20" indicating Severe Depression at this time.  Lopaka also has multiple stressors with his health, the conflict with his siblings and severe financial issues.  He states he will likely be "kicked out" of his rental home soon due to inability to pay rent - and he has no money for heat in his house at this time.  Counselor provided Shawntez with information on [employer] for assistance.  Counselor also recommended Grief Share support groups to him as he struggles through the loss of his mother.  Ahamed checked that he has "thoughts that he would be better off dead."  Counselor assessed with Randeep stating he has no plan and is just feeling sad mostly missing his mother. He is followed by his psychiatrist and he confirmed a plan if these  thoughts became worse.  Counselor will follow with Jeneen Rinks on all of this.     Expected Outcomes  Eldrige will benefit from consistent exercise to achieve his goals of breathing better and having a  better quality of life.  He will contact [address] for financial and housing assistance.  He will look into Grief Share or Hospice for grief support and will follow with his psychiatrist for mood stabilization.      Continue Psychosocial Services   Follow up required by counselor       Psychosocial Re-Evaluation: Psychosocial Re-Evaluation    [employer] Name 10/13/17 1154 11/08/17 1228           Psychosocial Re-Evaluation   Current issues with  History of Depression;Current Depression;Current Stress Concerns  History of Depression;Current Depression;Current Stress Concerns      Comments  Kash is trying to pay bills and is having a hard time. He is also having trouble getting a ride to [place]. He is having trouble telling [employer] that he needs his ride to [place] so he can improve his breathing.   Krishiv is trying to move and is having a lot of trouble getting all the information together for his daughter and her daughter. His ride to [place] has been difficult. He has to use [employer] to get here. He has bought a moped and is trying to fix it up so he can drive himself. He got his license recently and is proud of that. His  rides for [employer] have been doing ok but with all of his appointments he has missed a few rides. He can only miss three rides in a month before they stop picking him up.      Expected Outcomes  Short: Call [employer] to get rides to [place]. Long: Maintain getting rides to [facility] to reduce stress.  Short: attened [facility] to decrease stress. Long: maintain an exercise routine to minimize stress.      Interventions  Encouraged to attend Pulmonary Rehabilitation for the exercise  Encouraged to attend Pulmonary Rehabilitation for the exercise;Stress management education;Relaxation education       Continue Psychosocial Services   Follow up required by staff  Follow up required by staff         Psychosocial Discharge (Final Psychosocial Re-Evaluation): Psychosocial Re-Evaluation - 11/08/17 1228      Psychosocial Re-Evaluation   Current issues with  History of Depression;Current Depression;Current Stress Concerns    Comments  Daxtyn is trying to move and is having a lot of trouble getting all the information together for his daughter and her daughter. His ride to [place] has been difficult. He has to use [employer] to get here. He has bought a moped and is trying to fix it up so he can drive himself. He got his license recently and is proud of that. His rides for [employer] have been doing ok but with all of his appointments he has missed a few rides. He can only miss three rides in a month before they stop picking him up.    Expected Outcomes  Short: attened [facility] to decrease stress. Long: maintain an exercise routine to minimize stress.    Interventions  Encouraged to attend Pulmonary Rehabilitation for the exercise;Stress management education;Relaxation education    Continue Psychosocial Services   Follow up required by staff       Education: Education Goals: Education classes will be provided on a weekly basis, covering required topics. Participant will state understanding/return demonstration of topics presented.  Learning Barriers/Preferences: Learning Barriers/Preferences - 09/11/17 1412      Learning Barriers/Preferences   Learning Barriers  None    Learning Preferences  None       Education Topics:  Initial Evaluation Education: - Verbal, written and demonstration of respiratory meds, oximetry and breathing techniques. Instruction on use of nebulizers and MDIs and importance of monitoring MDI activations.   Pulmonary Rehab from 11/15/2017 in [facility] Cardiac and Pulmonary Rehab  Date  09/11/17  Educator  [facility]  Instruction Review Code  1- Verbalizes Understanding      General  Nutrition Guidelines/Fats and Fiber: -Group instruction provided by verbal, written material, models and posters to present the general guidelines for heart healthy nutrition. Gives an explanation and review of dietary fats and fiber.   Pulmonary Rehab from 11/15/2017 in [facility] Cardiac and Pulmonary Rehab  Date  10/02/17  Educator  [facility]  Instruction Review Code  1- Verbalizes Understanding      Controlling Sodium/Reading Food Labels: -Group verbal and written material supporting the discussion of sodium use in heart healthy nutrition. Review and explanation with models, verbal and written materials for utilization of the food label.   Exercise Physiology & General Exercise Guidelines: - Group verbal and written instruction with models to review the exercise physiology of the cardiovascular system and associated critical values. Provides general exercise guidelines with specific guidelines to those with heart or lung disease.    Aerobic Exercise & Resistance Training: - Gives group verbal and written  instruction on the various components of exercise. Focuses on aerobic and resistive training programs and the benefits of this training and how to safely progress through these programs.   Pulmonary Rehab from 11/15/2017 in [facility] Cardiac and Pulmonary Rehab  Date  09/22/17  Educator  [facility]  Instruction Review Code  1- Verbalizes Understanding      Flexibility, Balance, Mind/Body Relaxation: Provides group verbal/written instruction on the benefits of flexibility and balance training, including mind/body exercise modes such as yoga, pilates and tai chi.  Demonstration and skill practice provided.   Pulmonary Rehab from 11/15/2017 in [facility] Cardiac and Pulmonary Rehab  Date  10/18/17  Educator  [facility]  Instruction Review Code  1- Verbalizes Understanding      Stress and Anxiety: - Provides group verbal and written instruction about the health risks of elevated stress and causes of high stress.   Discuss the correlation between heart/lung disease and anxiety and treatment options. Review healthy ways to manage with stress and anxiety.   Pulmonary Rehab from 11/15/2017 in [facility] Cardiac and Pulmonary Rehab  Date  11/08/17  Educator  [facility]  Instruction Review Code  1- Verbalizes Understanding      Depression: - Provides group verbal and written instruction on the correlation between heart/lung disease and depressed mood, treatment options, and the stigmas associated with seeking treatment.   Exercise & Equipment Safety: - Individual verbal instruction and demonstration of equipment use and safety with use of the equipment.   Pulmonary Rehab from 11/15/2017 in [facility] Cardiac and Pulmonary Rehab  Date  09/11/17  Educator  [facility]  Instruction Review Code  1- Verbalizes Understanding      Infection Prevention: - Provides verbal and written material to individual with discussion of infection control including proper hand washing and proper equipment cleaning during exercise session.   Pulmonary Rehab from 11/15/2017 in [facility] Cardiac and Pulmonary Rehab  Date  09/11/17  Educator  [facility]  Instruction Review Code  1- Verbalizes Understanding      Falls Prevention: - Provides verbal and written material to individual with discussion of falls prevention and safety.   Pulmonary Rehab from 11/15/2017 in [facility] Cardiac and Pulmonary Rehab  Date  09/11/17  Educator  [facility]  Instruction Review Code  1- Verbalizes Understanding      Diabetes: - Individual verbal and written instruction to review signs/symptoms of diabetes, desired ranges of glucose level fasting, after meals and with exercise. Advice that pre and post exercise glucose checks will be done for 3 sessions at entry of program.   Chronic Lung Diseases: - Group verbal and written instruction to review updates, respiratory medications, advancements in procedures and treatments. Discuss use of supplemental oxygen including available portable oxygen  systems, continuous and intermittent flow rates, concentrators, personal use and safety guidelines. Review proper use of inhaler and spacers. Provide informative websites for self-education.    Energy Conservation: - Provide group verbal and written instruction for methods to conserve energy, plan and organize activities. Instruct on pacing techniques, use of adaptive equipment and posture/positioning to relieve shortness of breath.   Triggers and Exacerbations: - Group verbal and written instruction to review types of environmental triggers and ways to prevent exacerbations. Discuss weather changes, air quality and the benefits of nasal washing. Review warning signs and symptoms to help prevent infections. Discuss techniques for effective airway clearance, coughing, and vibrations.   Pulmonary Rehab from 11/15/2017 in [facility] Cardiac and Pulmonary Rehab  Date  09/20/17  Educator  [facility]  Instruction Review Code  1- Verbalizes Understanding      AED/CPR: - Group verbal and written instruction with the use of models to demonstrate the basic use of the AED with the basic ABC's of resuscitation.   Anatomy and Physiology of the Lungs: - Group verbal and written instruction with the use of models to provide basic lung anatomy and physiology related to function, structure and complications of lung disease.   Pulmonary Rehab from 11/15/2017 in [facility] Cardiac and Pulmonary Rehab  Date  11/15/17  Educator  [facility]  Instruction Review Code  1- Verbalizes Understanding      Anatomy & Physiology of the Heart: - Group verbal and written instruction and models provide basic cardiac anatomy and physiology, with the coronary electrical and arterial systems. Review of Valvular disease and Heart Failure   Pulmonary Rehab from 11/15/2017 in [facility] Cardiac and Pulmonary Rehab  Date  10/04/17  Educator  [facility]  Instruction Review Code  1- Verbalizes Understanding      Cardiac Medications: - Group verbal and written  instruction to review commonly prescribed medications for heart disease. Reviews the medication, class of the drug, and side effects.   Pulmonary Rehab from 11/15/2017 in [facility] Cardiac and Pulmonary Rehab  Date  10/20/17  Educator  [facility]  Instruction Review Code  1- Verbalizes Understanding      Know Your Numbers and Risk Factors: -Group verbal and written instruction about important numbers in your health.  Discussion of what are risk factors and how they play a role in the disease process.  Review of Cholesterol, Blood Pressure, Diabetes, and BMI and the role they play in your overall health.   Sleep Hygiene: -Provides group verbal and written instruction about how sleep can affect your health.  Define sleep hygiene, discuss sleep cycles and impact of sleep habits. Review good sleep hygiene tips.    Other: -Provides group and verbal instruction on various topics (see comments)    Knowledge Questionnaire Score: Knowledge Questionnaire Score - 09/11/17 1412      Knowledge Questionnaire Score   Pre Score  13/18 reviewed with patient        Core Components/Risk Factors/Patient Goals at Admission: Personal Goals and Risk Factors at Admission - 09/11/17 1422      Core Components/Risk Factors/Patient Goals on Admission    Weight Management  Yes;Weight Loss    Intervention  Weight Management: Develop a combined nutrition and exercise program designed to reach desired caloric intake, while maintaining appropriate intake of nutrient and fiber, sodium and fats, and appropriate energy expenditure required for the weight goal.;Weight Management: Provide education and appropriate resources to help participant work on and attain dietary goals.;Weight Management/Obesity: Establish reasonable short term and long term weight goals.    Admit Weight  196 lb 11.2 oz (89.2 kg)    Goal Weight: Short Term  191 lb (86.6 kg)    Goal Weight: Long Term  180 lb (81.6 kg)    Expected Outcomes  Short Term:  Continue to assess and modify interventions until short term weight is achieved;Long Term: Adherence to nutrition and physical activity/exercise program aimed toward attainment of established weight goal;Weight Loss: Understanding of general recommendations for a balanced deficit meal plan, which promotes 1-2 lb weight loss per week and includes a negative energy balance of [phone] kcal/d;Understanding recommendations for meals to include 15-35% energy as protein, 25-35% energy from fat, 35-60% energy from carbohydrates, less than 243m of dietary cholesterol, 20-35 gm of total fiber daily;Understanding of distribution  of calorie intake throughout the day with the consumption of 4-5 meals/snacks    Tobacco Cessation  Yes    Number of packs per day  1 pack a day    Intervention  Assist the participant in steps to quit. Provide individualized education and counseling about committing to Tobacco Cessation, relapse prevention, and pharmacological support that can be provided by physician.;Advice worker, assist with locating and accessing local/national Quit Smoking programs, and support quit date choice.    Expected Outcomes  Short Term: Will demonstrate readiness to quit, by selecting a quit date.;Short Term: Will quit all tobacco product use, adhering to prevention of relapse plan.;Long Term: Complete abstinence from all tobacco products for at least 12 months from quit date.    Improve shortness of breath with ADL's  Yes    Intervention  Provide education, individualized exercise plan and daily activity instruction to help decrease symptoms of SOB with activities of daily living.    Expected Outcomes  Short Term: Achieves a reduction of symptoms when performing activities of daily living.    Diabetes  Yes    Intervention  Provide education about signs/symptoms and action to take for hypo/hyperglycemia.;Provide education about proper nutrition, including hydration, and aerobic/resistive  exercise prescription along with prescribed medications to achieve blood glucose in normal ranges: Fasting glucose 65-99 mg/dL    Expected Outcomes  Short Term: Participant verbalizes understanding of the signs/symptoms and immediate care of hyper/hypoglycemia, proper foot care and importance of medication, aerobic/resistive exercise and nutrition plan for blood glucose control.;Long Term: Attainment of HbA1C < 7%.    Heart Failure  Yes    Intervention  Provide a combined exercise and nutrition program that is supplemented with education, support and counseling about heart failure. Directed toward relieving symptoms such as shortness of breath, decreased exercise tolerance, and extremity edema.    Expected Outcomes  Improve functional capacity of life;Short term: Attendance in program 2-3 days a week with increased exercise capacity. Reported lower sodium intake. Reported increased fruit and vegetable intake. Reports medication compliance.;Short term: Daily weights obtained and reported for increase. Utilizing diuretic protocols set by physician.;Long term: Adoption of self-care skills and reduction of barriers for early signs and symptoms recognition and intervention leading to self-care maintenance.    Hypertension  Yes    Intervention  Provide education on lifestyle modifcations including regular physical activity/exercise, weight management, moderate sodium restriction and increased consumption of fresh fruit, vegetables, and low fat dairy, alcohol moderation, and smoking cessation.;Monitor prescription use compliance.    Expected Outcomes  Short Term: Continued assessment and intervention until BP is < 140/22m HG in hypertensive participants. < 130/822mHG in hypertensive participants with diabetes, heart failure or chronic kidney disease.;Long Term: Maintenance of blood pressure at goal levels.       Core Components/Risk Factors/Patient Goals Review:  Goals and Risk Factor Review    Row Name  10/13/17 1223 10/20/17 1431           Core Components/Risk Factors/Patient Goals Review   Personal Goals Review  Weight Management/Obesity;Hypertension;Tobacco Cessation;Improve shortness of breath with ADL's;Diabetes;Heart Failure  Diabetes      Review  [place] when [place] get a ride. He is a little bit interested in quitting smoking. Informed him of the benifits, told him about the free classes and gave him a packet about how to quit.  JaJhaydenad questions about his new glucometer and when/how to check his sugar. Handouts were given  and reviewed with Sharif about keeping a log of his readings, normal ranges for diabetics, and a step by step how to check his sugar. He was encouraged to bring his meter in so staff can help him with manipulating equipment.       Expected Outcomes  Short: sign up for a class to quit smoking. Long: Quit smoking.  Short: Murray will learn how to use his glucometer properly and keep track of his CBG readings. Long: Kenta's CBG numbers will become more controlled through diet, exercise, and maintenence.          Core Components/Risk Factors/Patient Goals at Discharge (Final Review):  Goals and Risk Factor Review - 10/20/17 1431      Core Components/Risk Factors/Patient Goals Review   Personal Goals Review  Diabetes    Review  Ricco had questions about his new glucometer and when/how to check his sugar. Handouts were given and reviewed with Jeneen Rinks about keeping a log of his readings, normal ranges for diabetics, and a step by step how to check his sugar. He was encouraged to bring his meter in so staff can help him with manipulating equipment.     Expected Outcomes  Short: Arren will learn how to use his glucometer properly and keep track of his CBG readings. Long: Yosiah's CBG numbers will become more controlled through diet, exercise, and maintenence.        ITP Comments: ITP Comments    Row Name 09/11/17 1350 09/18/17  0838 09/22/17 1140 10/06/17 1159 10/16/17 0938   ITP Comments  Medical Evaluation completed. Chart sent for review and changes to Dr. Emily Filbert Director of [place]. Diagnosis can be found in [facility] encounter 09/11/17  30 day review completed. ITP sent to Dr. Emily Filbert Director of [place]. Continue with ITP unless changes are made by physician.  Tabb will not be in class on Monday due to trasportation.  Amedio will not be in class Wednesday 2/6 due to doctor's appointment  30 day review completed. ITP sent to Dr. Emily Filbert Director of [place]. Continue with ITP unless changes are made by physician.   [place] Name 10/31/17 [id number] 11/02/17 0954 11/13/17 0841 11/27/17 0901 11/29/17 1516   ITP Comments  Called patients cell phone. A women answered and was told to call the house number. No answer after call was made to the house. Message left for patient to call back.  Simona Huh states he is trying to move and will be back soon. He is going to let us know if he can continue to get rides to class and if not he will call us back. He wants to continue the program. Will check on patient if he does not attended [place].  30 day review completed. ITP sent to Dr. Emily Filbert Director of [place]. Continue with ITP unless changes are made by physician.  Visited [place] today to see how he is feeling. Informed him that he needs a doctors permision to return to [place]. He states that he had a heart attack and does not know when he will be back. He wants to return to [place] but may need Cardiac Rehab. Patient verbalizes understanding.  Simona Huh was ruled as a COPD exacerbation and respiratory failure.  The doctors ruled the elevated tropin to be demand ischemia versus a NSTEMI.  He will be able to continue in [facility] once cleared to return.    [place] Name 12/11/17 [id number] 12/11/17 1630 12/15/17 1141 12/15/17 1146     ITP Comments  30 day review completed. ITP sent to Dr. Emily Filbert Director of [place]. Continue with ITP  unless changes are made by physician  out since last review  Called to let pt know we had received his clearance to return.  Unable to leave message on mobile and home.  According to [place] records he is suppose to recieve home health. He has not been in [place] since 11/15/17. We have tried to contact him with no response. Patient will be sent a discharge notice letter by mail.       Comments: Discharge ITP

## 2017-12-15 NOTE — Progress Notes (Signed)
Discharge Progress Report  Patient Details  Name: Kyle Rich MRN: [id number] Date of Birth: [birth year] Referring Provider:     Pulmonary Rehab [facility]11/[facility]  Referring Provider  Angus PalmsGeorge, Sionne MD       Number of Visits: 16/36  Reason for Discharge:  Early Exit:  Personal, Lack of attendance and has not attended since 11/15/17  Smoking History:  Social History   Tobacco Use  Smoking Status Current Every Day Smoker  . Packs/day: 1.00  . Years: 40.00  . Pack years: 40.00  . Types: Cigarettes  Smokeless Tobacco Never Used  Tobacco Comment   he is interested but doesnt want to quit due to him mother passing away    Diagnosis:  Chronic obstructive pulmonary disease, unspecified COPD type (HCC)  ADL UCSD: Pulmonary Assessment Scores    Row Name 09/11/17 1410         ADL UCSD   ADL Phase  Entry     SOB Score total  84     Rest  3     Walk  3     Stairs  5     Bath  4     Dress  3     Shop  4       CAT Score   CAT Score  27       mMRC Score   mMRC Score  3        Initial Exercise Prescription: Initial Exercise Prescription - 09/11/17 1500      Date of Initial Exercise RX and Referring Provider   Date  09/11/17    Referring Provider  Angus PalmsGeorge, Sionne MD      Oxygen   Oxygen  Continuous    Liters  2      Treadmill   MPH  1    Grade  0    Minutes  15    METs  1.77      NuStep   Level  1    SPM  80    Minutes  15    METs  1.7      Arm Ergometer   Level  1    Watts  22    RPM  25    Minutes  15    METs  1.7      Prescription Details   Frequency (times per week)  3    Duration  Progress to 45 minutes of aerobic exercise without signs/symptoms of physical distress      Intensity   THRR 40-80% of Max Heartrate  103-139    Ratings of Perceived Exertion  11-15    Perceived Dyspnea  0-4      Progression   Progression  Continue to progress workloads to maintain intensity without signs/symptoms of physical  distress.      Resistance Training   Training Prescription  Yes    Weight  3 lbs    Reps  10-15       Discharge Exercise Prescription (Final Exercise Prescription Changes): Exercise Prescription Changes - 11/14/17 1600      Response to Exercise   Blood Pressure (Admit)  124/82    Blood Pressure (Exit)  122/80    Heart Rate (Admit)  115 bpm    Heart Rate (Exercise)  83 bpm    Heart Rate (Exit)  67 bpm    Oxygen Saturation (Admit)  94 %    Oxygen Saturation (Exercise)  93 %  Oxygen Saturation (Exit)  97 %    Rating of Perceived Exertion (Exercise)  13    Perceived Dyspnea (Exercise)  3    Symptoms  SOB    Duration  Continue with 45 min of aerobic exercise without signs/symptoms of physical distress.    Intensity  THRR unchanged      Progression   Progression  Continue to progress workloads to maintain intensity without signs/symptoms of physical distress.    Average METs  2.12      Resistance Training   Training Prescription  Yes    Weight  10 lbs    Reps  10-15      Interval Training   Interval Training  No      Oxygen   Oxygen  Continuous    Liters  2      Treadmill   MPH  1    Grade  0    Minutes  15    METs  1.77      NuStep   Level  7    SPM  60    Minutes  15    METs  2.5      Arm Ergometer   Level  2    Minutes  15    METs  2.1      Home Exercise Plan   Plans to continue exercise at  Home (comment) walking with dog and at stores    Frequency  Add 2 additional days to program exercise sessions.    Initial Home Exercises Provided  10/04/17       Functional Capacity: 6 Minute Walk    Row Name 09/11/17 1530         6 Minute Walk   Phase  Initial     Distance  570 feet     Walk Time  4.65 minutes     # of Rest Breaks  2 1:20, stopped at 5:01     MPH  1.39     METS  1.59     RPE  13     Perceived Dyspnea   2     VO2 Peak  5.55     Symptoms  Yes (comment)     Comments  leg fatigue/heaviness/pain 10/10     Resting HR  67 bpm      Resting BP  146/76     Resting Oxygen Saturation   94 %     Exercise Oxygen Saturation  during 6 min walk  93 %     Max Ex. HR  85 bpm     Max Ex. BP  154/74     2 Minute Post BP  144/70       Interval HR   1 Minute HR  70     2 Minute HR  74     3 Minute HR  74     4 Minute HR  78     5 Minute HR  85     6 Minute HR  75     2 Minute Post HR  66     Interval Heart Rate?  Yes       Interval Oxygen   Interval Oxygen?  Yes     Baseline Oxygen Saturation %  94 %     1 Minute Oxygen Saturation %  93 %     1 Minute Liters of Oxygen  2 L     2 Minute Oxygen Saturation %  94 %  2 Minute Liters of Oxygen  2 L     3 Minute Oxygen Saturation %  94 %     3 Minute Liters of Oxygen  2 L     4 Minute Oxygen Saturation %  95 %     4 Minute Liters of Oxygen  2 L     5 Minute Oxygen Saturation %  95 %     5 Minute Liters of Oxygen  2 L     6 Minute Oxygen Saturation %  95 %     6 Minute Liters of Oxygen  2 L     2 Minute Post Oxygen Saturation %  96 %     2 Minute Post Liters of Oxygen  2 L        Psychological, QOL, Others - Outcomes: PHQ 2/9: Depression screen PHQ 2/9 09/11/2017  Decreased Interest 2  Down, Depressed, Hopeless 2  PHQ - 2 Score 4  Altered sleeping 3  Tired, decreased energy 3  Change in appetite 2  Feeling bad or failure about yourself  3  Trouble concentrating 2  Moving slowly or fidgety/restless 1  Suicidal thoughts 2  PHQ-9 Score 20  Difficult doing work/chores Somewhat difficult    Quality of Life:   Personal Goals: Goals established at orientation with interventions provided to work toward goal. Personal Goals and Risk Factors at Admission - 09/11/17 1422      Core Components/Risk Factors/Patient Goals on Admission    Weight Management  Yes;Weight Loss    Intervention  Weight Management: Develop a combined nutrition and exercise program designed to reach desired caloric intake, while maintaining appropriate intake of nutrient and fiber, sodium and  fats, and appropriate energy expenditure required for the weight goal.;Weight Management: Provide education and appropriate resources to help participant work on and attain dietary goals.;Weight Management/Obesity: Establish reasonable short term and long term weight goals.    Admit Weight  196 lb 11.2 oz (89.2 kg)    Goal Weight: Short Term  191 lb (86.6 kg)    Goal Weight: Long Term  180 lb (81.6 kg)    Expected Outcomes  Short Term: Continue to assess and modify interventions until short term weight is achieved;Long Term: Adherence to nutrition and physical activity/exercise program aimed toward attainment of established weight goal;Weight Loss: Understanding of general recommendations for a balanced deficit meal plan, which promotes 1-2 lb weight loss per week and includes a negative energy balance of [phone] kcal/d;Understanding recommendations for meals to include 15-35% energy as protein, 25-35% energy from fat, 35-60% energy from carbohydrates, less than 200mg  of dietary cholesterol, 20-35 gm of total fiber daily;Understanding of distribution of calorie intake throughout the day with the consumption of 4-5 meals/snacks    Tobacco Cessation  Yes    Number of packs per day  1 pack a day    Intervention  Assist the participant in steps to quit. Provide individualized education and counseling about committing to Tobacco Cessation, relapse prevention, and pharmacological support that can be provided by physician.;Education officer, environmental, assist with locating and accessing local/national Quit Smoking programs, and support quit date choice.    Expected Outcomes  Short Term: Will demonstrate readiness to quit, by selecting a quit date.;Short Term: Will quit all tobacco product use, adhering to prevention of relapse plan.;Long Term: Complete abstinence from all tobacco products for at least 12 months from quit date.    Improve shortness of breath with ADL's  Yes    Intervention  Provide education,  individualized exercise plan and daily activity instruction to help decrease symptoms of SOB with activities of daily living.    Expected Outcomes  Short Term: Achieves a reduction of symptoms when performing activities of daily living.    Diabetes  Yes    Intervention  Provide education about signs/symptoms and action to take for hypo/hyperglycemia.;Provide education about proper nutrition, including hydration, and aerobic/resistive exercise prescription along with prescribed medications to achieve blood glucose in normal ranges: Fasting glucose 65-99 mg/dL    Expected Outcomes  Short Term: Participant verbalizes understanding of the signs/symptoms and immediate care of hyper/hypoglycemia, proper foot care and importance of medication, aerobic/resistive exercise and nutrition plan for blood glucose control.;Long Term: Attainment of HbA1C < 7%.    Heart Failure  Yes    Intervention  Provide a combined exercise and nutrition program that is supplemented with education, support and counseling about heart failure. Directed toward relieving symptoms such as shortness of breath, decreased exercise tolerance, and extremity edema.    Expected Outcomes  Improve functional capacity of life;Short term: Attendance in program 2-3 days a week with increased exercise capacity. Reported lower sodium intake. Reported increased fruit and vegetable intake. Reports medication compliance.;Short term: Daily weights obtained and reported for increase. Utilizing diuretic protocols set by physician.;Long term: Adoption of self-care skills and reduction of barriers for early signs and symptoms recognition and intervention leading to self-care maintenance.    Hypertension  Yes    Intervention  Provide education on lifestyle modifcations including regular physical activity/exercise, weight management, moderate sodium restriction and increased consumption of fresh fruit, vegetables, and low fat dairy, alcohol moderation, and smoking  cessation.;Monitor prescription use compliance.    Expected Outcomes  Short Term: Continued assessment and intervention until BP is < 140/21mm HG in hypertensive participants. < 130/62mm HG in hypertensive participants with diabetes, heart failure or chronic kidney disease.;Long Term: Maintenance of blood pressure at goal levels.        Personal Goals Discharge: Goals and Risk Factor Review    Row Name 10/13/17 1223 10/20/17 1431           Core Components/Risk Factors/Patient Goals Review   Personal Goals Review  Weight Management/Obesity;Hypertension;Tobacco Cessation;Improve shortness of breath with ADL's;Diabetes;Heart Failure  Diabetes      Review  Kyle Rich has been coming to [facility] routinely except for when he cannot get a ride. He is a little bit interested in quitting smoking. Informed him of the benifits, told him about the free classes and gave him a packet about how to quit.  Kyle Rich had questions about his new glucometer and when/how to check his sugar. Handouts were given and reviewed with Kyle Rich about keeping a log of his readings, normal ranges for diabetics, and a step by step how to check his sugar. He was encouraged to bring his meter in so staff can help him with manipulating equipment.       Expected Outcomes  Short: sign up for a class to quit smoking. Long: Quit smoking.  Short: Kyle Rich will learn how to use his glucometer properly and keep track of his CBG readings. Long: Kyle Rich's CBG numbers will become more controlled through diet, exercise, and maintenence.          Exercise Goals and Review: Exercise Goals    Row Name 09/11/17 1544             Exercise Goals   Increase Physical Activity  Yes       Intervention  Provide  advice, education, support and counseling about physical activity/exercise needs.;Develop an individualized exercise prescription for aerobic and resistive training based on initial evaluation findings, risk stratification, comorbidities and  participant's personal goals.       Expected Outcomes  Achievement of increased cardiorespiratory fitness and enhanced flexibility, muscular endurance and strength shown through measurements of functional capacity and personal statement of participant.       Increase Strength and Stamina  Yes       Intervention  Provide advice, education, support and counseling about physical activity/exercise needs.;Develop an individualized exercise prescription for aerobic and resistive training based on initial evaluation findings, risk stratification, comorbidities and participant's personal goals.       Expected Outcomes  Achievement of increased cardiorespiratory fitness and enhanced flexibility, muscular endurance and strength shown through measurements of functional capacity and personal statement of participant.       Able to understand and use rate of perceived exertion (RPE) scale  Yes       Intervention  Provide education and explanation on how to use RPE scale       Expected Outcomes  Short Term: Able to use RPE daily in rehab to express subjective intensity level;Long Term:  Able to use RPE to guide intensity level when exercising independently       Able to understand and use Dyspnea scale  Yes       Intervention  Provide education and explanation on how to use Dyspnea scale       Expected Outcomes  Short Term: Able to use Dyspnea scale daily in rehab to express subjective sense of shortness of breath during exertion;Long Term: Able to use Dyspnea scale to guide intensity level when exercising independently       Knowledge and understanding of Target Heart Rate Range (THRR)  Yes       Intervention  Provide education and explanation of THRR including how the numbers were predicted and where they are located for reference       Expected Outcomes  Short Term: Able to state/look up THRR;Long Term: Able to use THRR to govern intensity when exercising independently;Short Term: Able to use daily as guideline  for intensity in rehab       Able to check pulse independently  Yes       Intervention  Provide education and demonstration on how to check pulse in carotid and radial arteries.;Review the importance of being able to check your own pulse for safety during independent exercise       Expected Outcomes  Short Term: Able to explain why pulse checking is important during independent exercise;Long Term: Able to check pulse independently and accurately       Understanding of Exercise Prescription  Yes       Intervention  Provide education, explanation, and written materials on patient's individual exercise prescription       Expected Outcomes  Short Term: Able to explain program exercise prescription;Long Term: Able to explain home exercise prescription to exercise independently          Nutrition & Weight - Outcomes: Pre Biometrics - 09/11/17 1545      Pre Biometrics   Height  5' 3.5" (1.613 m)    Weight  196 lb 11.2 oz (89.2 kg)    Waist Circumference  43 inches    Hip Circumference  42 inches    Waist to Hip Ratio  1.02 %    BMI (Calculated)  34.29  Nutrition: Nutrition Therapy & Goals - 09/11/17 1409      Personal Nutrition Goals   Comments  Patients wants to lose weight and eat healthier. He would like to meet with the dietician.      Intervention Plan   Intervention  Prescribe, educate and counsel regarding individualized specific dietary modifications aiming towards targeted core components such as weight, hypertension, lipid management, diabetes, heart failure and other comorbidities.;Nutrition handout(s) given to patient.    Expected Outcomes  Short Term Goal: Understand basic principles of dietary content, such as calories, fat, sodium, cholesterol and nutrients.;Long Term Goal: Adherence to prescribed nutrition plan.       Nutrition Discharge:   Education Questionnaire Score: Knowledge Questionnaire Score - 09/11/17 1412      Knowledge Questionnaire Score   Pre  Score  13/18 reviewed with patient       Goals reviewed with patient; copy given to patient.

## 2017-12-18 ENCOUNTER — Emergency Department: Payer: Medicaid Other

## 2017-12-18 ENCOUNTER — Inpatient Hospital Stay
Admission: EM | Admit: 2017-12-18 | Discharge: 2017-12-24 | DRG: 193 | Disposition: A | Discharge status: Home Health | Payer: Medicaid Other | Attending: Internal Medicine | Admitting: Internal Medicine

## 2017-12-18 ENCOUNTER — Other Ambulatory Visit: Payer: Self-pay

## 2017-12-18 ENCOUNTER — Inpatient Hospital Stay: Payer: Medicaid Other

## 2017-12-18 DIAGNOSIS — J441 Chronic obstructive pulmonary disease with (acute) exacerbation: Secondary | ICD-10-CM | POA: Diagnosis not present

## 2017-12-18 DIAGNOSIS — I5022 Chronic systolic (congestive) heart failure: Secondary | ICD-10-CM | POA: Diagnosis present

## 2017-12-18 DIAGNOSIS — Z8619 Personal history of other infectious and parasitic diseases: Secondary | ICD-10-CM | POA: Diagnosis not present

## 2017-12-18 DIAGNOSIS — J9621 Acute and chronic respiratory failure with hypoxia: Secondary | ICD-10-CM | POA: Diagnosis present

## 2017-12-18 DIAGNOSIS — Y95 Nosocomial condition: Secondary | ICD-10-CM | POA: Diagnosis present

## 2017-12-18 DIAGNOSIS — R402414 Glasgow coma scale score 13-15, 24 hours or more after hospital admission: Secondary | ICD-10-CM | POA: Diagnosis not present

## 2017-12-18 DIAGNOSIS — I249 Acute ischemic heart disease, unspecified: Secondary | ICD-10-CM | POA: Diagnosis present

## 2017-12-18 DIAGNOSIS — I2510 Atherosclerotic heart disease of native coronary artery without angina pectoris: Secondary | ICD-10-CM | POA: Diagnosis present

## 2017-12-18 DIAGNOSIS — M1990 Unspecified osteoarthritis, unspecified site: Secondary | ICD-10-CM | POA: Diagnosis present

## 2017-12-18 DIAGNOSIS — I252 Old myocardial infarction: Secondary | ICD-10-CM

## 2017-12-18 DIAGNOSIS — Z79899 Other long term (current) drug therapy: Secondary | ICD-10-CM | POA: Diagnosis not present

## 2017-12-18 DIAGNOSIS — E1165 Type 2 diabetes mellitus with hyperglycemia: Secondary | ICD-10-CM | POA: Diagnosis present

## 2017-12-18 DIAGNOSIS — J440 Chronic obstructive pulmonary disease with acute lower respiratory infection: Secondary | ICD-10-CM | POA: Diagnosis present

## 2017-12-18 DIAGNOSIS — X58XXXA Exposure to other specified factors, initial encounter: Secondary | ICD-10-CM | POA: Diagnosis present

## 2017-12-18 DIAGNOSIS — Z7982 Long term (current) use of aspirin: Secondary | ICD-10-CM | POA: Diagnosis not present

## 2017-12-18 DIAGNOSIS — F419 Anxiety disorder, unspecified: Secondary | ICD-10-CM | POA: Diagnosis present

## 2017-12-18 DIAGNOSIS — Z7984 Long term (current) use of oral hypoglycemic drugs: Secondary | ICD-10-CM | POA: Diagnosis not present

## 2017-12-18 DIAGNOSIS — Z7902 Long term (current) use of antithrombotics/antiplatelets: Secondary | ICD-10-CM | POA: Diagnosis not present

## 2017-12-18 DIAGNOSIS — J181 Lobar pneumonia, unspecified organism: Principal | ICD-10-CM | POA: Diagnosis present

## 2017-12-18 DIAGNOSIS — T380X5A Adverse effect of glucocorticoids and synthetic analogues, initial encounter: Secondary | ICD-10-CM | POA: Diagnosis present

## 2017-12-18 DIAGNOSIS — F17210 Nicotine dependence, cigarettes, uncomplicated: Secondary | ICD-10-CM | POA: Diagnosis present

## 2017-12-18 DIAGNOSIS — J189 Pneumonia, unspecified organism: Secondary | ICD-10-CM

## 2017-12-18 DIAGNOSIS — Z7952 Long term (current) use of systemic steroids: Secondary | ICD-10-CM | POA: Diagnosis not present

## 2017-12-18 DIAGNOSIS — I110 Hypertensive heart disease with heart failure: Secondary | ICD-10-CM | POA: Diagnosis present

## 2017-12-18 DIAGNOSIS — Z7951 Long term (current) use of inhaled steroids: Secondary | ICD-10-CM | POA: Diagnosis not present

## 2017-12-18 DIAGNOSIS — S2232XA Fracture of one rib, left side, initial encounter for closed fracture: Secondary | ICD-10-CM | POA: Diagnosis present

## 2017-12-18 LAB — BASIC METABOLIC PANEL
Anion gap: 6 (ref 5–15)
BUN: 31 mg/dL — ABNORMAL HIGH (ref 6–20)
CALCIUM: 8.8 mg/dL — AB (ref 8.9–10.3)
CO2: 32 mmol/L (ref 22–32)
CREATININE: 1.15 mg/dL (ref 0.61–1.24)
Chloride: 102 mmol/L (ref 101–111)
GFR calc Af Amer: >60 mL/min (ref >60)
GFR calc non Af Amer: >60 mL/min (ref >60)
GLUCOSE: 110 mg/dL — AB (ref 65–99)
Potassium: 4.2 mmol/L (ref 3.5–5.1)
Sodium: 140 mmol/L (ref 135–145)

## 2017-12-18 LAB — GLUCOSE, CAPILLARY
GLUCOSE-CAPILLARY: 193 mg/dL — AB (ref 65–99)
Glucose-Capillary: 152 mg/dL — ABNORMAL HIGH (ref 65–99)

## 2017-12-18 LAB — LACTIC ACID, PLASMA
Lactic Acid, Venous: 1.3 mmol/L (ref 0.5–1.9)
Lactic Acid, Venous: 1.7 mmol/L (ref 0.5–1.9)

## 2017-12-18 LAB — CBC WITH DIFFERENTIAL/PLATELET
BASOS PCT: 0 %
Basophils Absolute: 0 10*3/uL (ref 0–0.1)
EOS PCT: 1 %
Eosinophils Absolute: 0.1 10*3/uL (ref 0–0.7)
HCT: 36 % — ABNORMAL LOW (ref 40.0–52.0)
Hemoglobin: 11.8 g/dL — ABNORMAL LOW (ref 13.0–18.0)
Lymphocytes Relative: 36 %
Lymphs Abs: 4.3 10*3/uL — ABNORMAL HIGH (ref 1.0–3.6)
MCH: 29.8 pg (ref 26.0–34.0)
MCHC: 32.8 g/dL (ref 32.0–36.0)
MCV: 90.8 fL (ref 80.0–100.0)
MONO ABS: 1 10*3/uL (ref 0.2–1.0)
MONOS PCT: 9 %
NEUTROS ABS: 6.3 10*3/uL (ref 1.4–6.5)
Neutrophils Relative %: 54 %
PLATELETS: 339 10*3/uL (ref 150–440)
RBC: 3.96 MIL/uL — ABNORMAL LOW (ref 4.40–5.90)
RDW: 15.5 % — AB (ref 11.5–14.5)
WBC: 11.8 10*3/uL — ABNORMAL HIGH (ref 3.8–10.6)

## 2017-12-18 LAB — BRAIN NATRIURETIC PEPTIDE: B Natriuretic Peptide: 186 pg/mL — ABNORMAL HIGH (ref 0.0–100.0)

## 2017-12-18 LAB — TROPONIN I: Troponin I: 0.05 ng/mL (ref <0.03)

## 2017-12-18 LAB — HEMOGLOBIN A1C
HEMOGLOBIN A1C: 6.8 % — AB (ref 4.8–5.6)
MEAN PLASMA GLUCOSE: 148.46 mg/dL

## 2017-12-18 MED ORDER — ONDANSETRON HCL 4 MG/2ML IJ SOLN
4.0000 mg | Freq: Once | INTRAMUSCULAR | Status: AC
  Administered 2017-12-18: 4 mg via INTRAVENOUS
  Filled 2017-12-18: qty 2

## 2017-12-18 MED ORDER — DOCUSATE SODIUM 100 MG PO CAPS
100.0000 mg | ORAL_CAPSULE | Freq: Two times a day (BID) | ORAL | Status: DC
  Administered 2017-12-18 – 2017-12-24 (×12): 100 mg via ORAL
  Filled 2017-12-18 (×12): qty 1

## 2017-12-18 MED ORDER — EZETIMIBE 10 MG PO TABS
10.0000 mg | ORAL_TABLET | Freq: Every day | ORAL | Status: DC
  Administered 2017-12-19 – 2017-12-24 (×6): 10 mg via ORAL
  Filled 2017-12-18 (×6): qty 1

## 2017-12-18 MED ORDER — METHYLPREDNISOLONE SODIUM SUCC 125 MG IJ SOLR
60.0000 mg | Freq: Two times a day (BID) | INTRAMUSCULAR | Status: DC
  Administered 2017-12-18 – 2017-12-22 (×7): 60 mg via INTRAVENOUS
  Filled 2017-12-18 (×7): qty 2

## 2017-12-18 MED ORDER — METFORMIN HCL 500 MG PO TABS
500.0000 mg | ORAL_TABLET | Freq: Two times a day (BID) | ORAL | Status: DC
  Administered 2017-12-18 – 2017-12-24 (×11): 500 mg via ORAL
  Filled 2017-12-18 (×12): qty 1

## 2017-12-18 MED ORDER — INSULIN ASPART 100 UNIT/ML ~~LOC~~ SOLN
0.0000 [IU] | Freq: Every day | SUBCUTANEOUS | Status: DC
  Administered 2017-12-22: 2 [IU] via SUBCUTANEOUS
  Administered 2017-12-23: 3 [IU] via SUBCUTANEOUS
  Filled 2017-12-18 (×2): qty 1

## 2017-12-18 MED ORDER — MOMETASONE FURO-FORMOTEROL FUM 200-5 MCG/ACT IN AERO
2.0000 | INHALATION_SPRAY | Freq: Two times a day (BID) | RESPIRATORY_TRACT | Status: DC
  Administered 2017-12-18: 2 via RESPIRATORY_TRACT
  Filled 2017-12-18: qty 8.8

## 2017-12-18 MED ORDER — ACETAMINOPHEN 325 MG PO TABS: 650.0000 mg | ORAL_TABLET | Freq: Four times a day (QID) | ORAL | Status: DC | PRN

## 2017-12-18 MED ORDER — ENOXAPARIN SODIUM 40 MG/0.4ML ~~LOC~~ SOLN
40.0000 mg | SUBCUTANEOUS | Status: DC
  Administered 2017-12-18 – 2017-12-23 (×6): 40 mg via SUBCUTANEOUS
  Filled 2017-12-18 (×6): qty 0.4

## 2017-12-18 MED ORDER — ALBUTEROL SULFATE (2.5 MG/3ML) 0.083% IN NEBU
5.0000 mg | INHALATION_SOLUTION | Freq: Once | RESPIRATORY_TRACT | Status: AC
  Administered 2017-12-18: 5 mg via RESPIRATORY_TRACT
  Filled 2017-12-18: qty 6

## 2017-12-18 MED ORDER — ASPIRIN EC 81 MG PO TBEC
81.0000 mg | DELAYED_RELEASE_TABLET | Freq: Every day | ORAL | Status: DC
  Administered 2017-12-19 – 2017-12-24 (×6): 81 mg via ORAL
  Filled 2017-12-18 (×6): qty 1

## 2017-12-18 MED ORDER — IPRATROPIUM-ALBUTEROL 0.5-2.5 (3) MG/3ML IN SOLN
3.0000 mL | Freq: Four times a day (QID) | RESPIRATORY_TRACT | Status: DC
  Administered 2017-12-18 – 2017-12-22 (×15): 3 mL via RESPIRATORY_TRACT
  Filled 2017-12-18 (×16): qty 3

## 2017-12-18 MED ORDER — POLYETHYLENE GLYCOL 3350 17 G PO PACK
17.0000 g | PACK | Freq: Every day | ORAL | Status: DC | PRN
  Administered 2017-12-20 – 2017-12-21 (×2): 17 g via ORAL
  Filled 2017-12-18 (×2): qty 1

## 2017-12-18 MED ORDER — MORPHINE SULFATE (PF) 4 MG/ML IV SOLN
4.0000 mg | Freq: Once | INTRAVENOUS | Status: AC
  Administered 2017-12-18: 4 mg via INTRAVENOUS
  Filled 2017-12-18: qty 1

## 2017-12-18 MED ORDER — AZITHROMYCIN 500 MG PO TABS
500.0000 mg | ORAL_TABLET | Freq: Every day | ORAL | Status: DC
  Administered 2017-12-18 – 2017-12-22 (×5): 500 mg via ORAL
  Filled 2017-12-18 (×5): qty 1

## 2017-12-18 MED ORDER — METOPROLOL TARTRATE 50 MG PO TABS
50.0000 mg | ORAL_TABLET | Freq: Two times a day (BID) | ORAL | Status: DC
  Administered 2017-12-18 – 2017-12-24 (×12): 50 mg via ORAL
  Filled 2017-12-18 (×11): qty 1

## 2017-12-18 MED ORDER — CLOPIDOGREL BISULFATE 75 MG PO TABS
75.0000 mg | ORAL_TABLET | Freq: Every day | ORAL | Status: DC
  Administered 2017-12-19 – 2017-12-24 (×6): 75 mg via ORAL
  Filled 2017-12-18 (×6): qty 1

## 2017-12-18 MED ORDER — ENOXAPARIN SODIUM 40 MG/0.4ML ~~LOC~~ SOLN: 40.0000 mg | SUBCUTANEOUS | Status: DC

## 2017-12-18 MED ORDER — BENZONATATE 100 MG PO CAPS
200.0000 mg | ORAL_CAPSULE | Freq: Three times a day (TID) | ORAL | Status: DC | PRN
  Administered 2017-12-19: 200 mg via ORAL
  Filled 2017-12-18: qty 2

## 2017-12-18 MED ORDER — METHYLPREDNISOLONE SODIUM SUCC 125 MG IJ SOLR
125.0000 mg | Freq: Once | INTRAMUSCULAR | Status: AC
  Administered 2017-12-18: 125 mg via INTRAVENOUS
  Filled 2017-12-18: qty 2

## 2017-12-18 MED ORDER — MORPHINE SULFATE (PF) 2 MG/ML IV SOLN
2.0000 mg | INTRAVENOUS | Status: DC | PRN
  Administered 2017-12-19 – 2017-12-24 (×9): 2 mg via INTRAVENOUS
  Filled 2017-12-18 (×9): qty 1

## 2017-12-18 MED ORDER — GABAPENTIN 300 MG PO CAPS
300.0000 mg | ORAL_CAPSULE | Freq: Three times a day (TID) | ORAL | Status: DC
  Administered 2017-12-18 – 2017-12-24 (×18): 300 mg via ORAL
  Filled 2017-12-18 (×18): qty 1

## 2017-12-18 MED ORDER — TIOTROPIUM BROMIDE MONOHYDRATE 18 MCG IN CAPS
1.0000 | ORAL_CAPSULE | Freq: Every day | RESPIRATORY_TRACT | Status: DC
  Administered 2017-12-18 – 2017-12-21 (×4): 18 ug via RESPIRATORY_TRACT
  Filled 2017-12-18: qty 5

## 2017-12-18 MED ORDER — SODIUM CHLORIDE 0.9 % IV SOLN: 2.0000 g | Freq: Once | INTRAVENOUS | Status: DC

## 2017-12-18 MED ORDER — ONDANSETRON HCL 4 MG/2ML IJ SOLN: 4.0000 mg | Freq: Four times a day (QID) | INTRAMUSCULAR | Status: DC | PRN

## 2017-12-18 MED ORDER — ACETAMINOPHEN 650 MG RE SUPP: 650.0000 mg | Freq: Four times a day (QID) | RECTAL | Status: DC | PRN

## 2017-12-18 MED ORDER — SODIUM CHLORIDE 0.9 % IV SOLN
1.0000 g | INTRAVENOUS | Status: DC
  Administered 2017-12-18 – 2017-12-21 (×4): 1 g via INTRAVENOUS
  Filled 2017-12-18 (×5): qty 10

## 2017-12-18 MED ORDER — ALBUTEROL SULFATE (2.5 MG/3ML) 0.083% IN NEBU
2.5000 mg | INHALATION_SOLUTION | RESPIRATORY_TRACT | Status: DC | PRN
  Administered 2017-12-19 – 2017-12-22 (×3): 2.5 mg via RESPIRATORY_TRACT
  Filled 2017-12-18 (×3): qty 3

## 2017-12-18 MED ORDER — ISOSORBIDE MONONITRATE ER 30 MG PO TB24
30.0000 mg | ORAL_TABLET | Freq: Every day | ORAL | Status: DC
  Administered 2017-12-19 – 2017-12-24 (×6): 30 mg via ORAL
  Filled 2017-12-18 (×6): qty 1

## 2017-12-18 MED ORDER — VANCOMYCIN HCL IN DEXTROSE 1-5 GM/200ML-% IV SOLN: 1000.0000 mg | Freq: Once | INTRAVENOUS | Status: DC

## 2017-12-18 MED ORDER — BUSPIRONE HCL 10 MG PO TABS
10.0000 mg | ORAL_TABLET | Freq: Two times a day (BID) | ORAL | Status: DC
  Administered 2017-12-18 – 2017-12-24 (×12): 10 mg via ORAL
  Filled 2017-12-18 (×12): qty 1

## 2017-12-18 MED ORDER — SODIUM CHLORIDE 0.9 % IV SOLN
INTRAVENOUS | Status: DC
  Administered 2017-12-18 – 2017-12-19 (×2): via INTRAVENOUS

## 2017-12-18 MED ORDER — BISACODYL 10 MG RE SUPP
10.0000 mg | Freq: Every day | RECTAL | Status: DC | PRN
  Filled 2017-12-18: qty 1

## 2017-12-18 MED ORDER — OXYCODONE-ACETAMINOPHEN 7.5-325 MG PO TABS
1.0000 | ORAL_TABLET | Freq: Four times a day (QID) | ORAL | Status: DC | PRN
  Administered 2017-12-18 – 2017-12-20 (×5): 1 via ORAL
  Filled 2017-12-18 (×5): qty 1

## 2017-12-18 MED ORDER — LISINOPRIL 5 MG PO TABS
5.0000 mg | ORAL_TABLET | Freq: Every day | ORAL | Status: DC
  Administered 2017-12-19 – 2017-12-24 (×6): 5 mg via ORAL
  Filled 2017-12-18 (×6): qty 1

## 2017-12-18 MED ORDER — NICOTINE 14 MG/24HR TD PT24
14.0000 mg | MEDICATED_PATCH | Freq: Every day | TRANSDERMAL | Status: DC
  Administered 2017-12-18 – 2017-12-24 (×7): 14 mg via TRANSDERMAL
  Filled 2017-12-18 (×7): qty 1

## 2017-12-18 MED ORDER — ONDANSETRON HCL 4 MG PO TABS: 4.0000 mg | ORAL_TABLET | Freq: Four times a day (QID) | ORAL | Status: DC | PRN

## 2017-12-18 MED ORDER — ESCITALOPRAM OXALATE 10 MG PO TABS
30.0000 mg | ORAL_TABLET | Freq: Every day | ORAL | Status: DC
  Administered 2017-12-18: 30 mg via ORAL
  Filled 2017-12-18 (×2): qty 1

## 2017-12-18 MED ORDER — INSULIN ASPART 100 UNIT/ML ~~LOC~~ SOLN
0.0000 [IU] | Freq: Three times a day (TID) | SUBCUTANEOUS | Status: DC
  Administered 2017-12-18: 3 [IU] via SUBCUTANEOUS
  Administered 2017-12-19 (×2): 2 [IU] via SUBCUTANEOUS
  Administered 2017-12-20 – 2017-12-23 (×7): 3 [IU] via SUBCUTANEOUS
  Administered 2017-12-24: 2 [IU] via SUBCUTANEOUS
  Filled 2017-12-18 (×10): qty 1
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
## 2017-12-18 NOTE — Progress Notes (Signed)
Patient Kyle Rich 1.7. MD notified. Orders received.

## 2017-12-18 NOTE — H&P (Signed)
[facility] [facility] [place] [facility]   PATIENT NAME: Kyle Rich    MR#:  [id number]  DATE OF BIRTH:  [birth year]  DATE OF ADMISSION:  12/18/2017  PRIMARY CARE PHYSICIAN: [name], Sionne A, MD   REQUESTING/REFERRING PHYSICIAN: Dr. Pershing ProudSchaevitz  CHIEF COMPLAINT:   Chief Complaint  Patient presents with  . Shortness of Breath  . Back Pain    HISTORY OF PRESENT ILLNESS:  Kyle Kyle Rich  is a 62 y.o. male with a known history of hypertension, diabetes, COPD, tobacco use who was here in the hospital recently for left sixth rib fracture presents to the ER again due to worsening shortness of breath and wheezing.  Patient continued to smoke in spite of his shortness of breath.  Today he has COPD exacerbation.  There is some concern about a left lower lobe infiltrate versus chest wall mass.  Patient has been given IV antibiotics and is being admitted to the hospital.  Normally he wears 2 L oxygen and is needing 4 L oxygen at this time to keep her saturations over 90%. Afebrile.  Normal WBC. He continues to have left-sided chest pain since his fall from his rib fracture.  PAST MEDICAL HISTORY:   Past Medical History:  Diagnosis Date  . ACS (acute coronary syndrome) (HCC)    unstable angina in 2017 leading to catheterization/PCI by Dr. Juliann Paresallwood  . Arthritis   . Asthma   . CHF (congestive heart failure) (HCC)   . COPD (chronic obstructive pulmonary disease) (HCC)   . Diabetes mellitus without complication (HCC)   . Hepatitis C   . Hypertension     PAST SURGICAL HISTORY:   Past Surgical History:  Procedure Laterality Date  . CARDIAC CATHETERIZATION N/A 06/15/2016   Procedure: Left Heart Cath and Coronary Angiography PCI;  Surgeon: Alwyn Peawayne D Callwood, MD;  Location: [facility] INVASIVE CV LAB;  Service: Cardiovascular;  Laterality: N/A;    SOCIAL HISTORY:   Social History   Tobacco Use  . Smoking status: Current Every Day Smoker    Packs/day: 1.00    Years: 40.00    Pack  years: 40.00    Types: Cigarettes  . Smokeless tobacco: Never Used  . Tobacco comment: he is interested but doesnt want to quit due to him mother passing away  Substance Use Topics  . Alcohol use: Yes    Alcohol/week: 0.6 oz    Types: 1 Cans of beer per week    FAMILY HISTORY:   Family History  Problem Relation Age of Onset  . Bladder Cancer Mother   . Peripheral vascular disease Mother   . Lung cancer Father     DRUG ALLERGIES:  No Known Allergies  REVIEW OF SYSTEMS:   Review of Systems  Constitutional: Positive for malaise/fatigue. Negative for chills and fever.  HENT: Negative for sore throat.   Eyes: Negative for blurred vision, double vision and pain.  Respiratory: Positive for cough, sputum production, shortness of breath and wheezing. Negative for hemoptysis.   Cardiovascular: Positive for chest pain. Negative for palpitations, orthopnea and leg swelling.  Gastrointestinal: Negative for abdominal pain, constipation, diarrhea, heartburn, nausea and vomiting.  Genitourinary: Negative for dysuria and hematuria.  Musculoskeletal: Negative for back pain and joint pain.  Skin: Negative for rash.  Neurological: Negative for sensory change, speech change, focal weakness and headaches.  Endo/Heme/Allergies: Does not bruise/bleed easily.  Psychiatric/Behavioral: Negative for depression. The patient is not nervous/anxious.     MEDICATIONS AT HOME:   Prior to Admission  medications   Medication Sig Start Date End Date Taking? Authorizing Provider  aspirin EC 81 MG tablet Take 81 mg by mouth daily.   Yes [provider]  metFORMIN (GLUCOPHAGE) 500 MG tablet Take 500 mg by mouth 2 (two) times daily.    Yes [provider]  albuterol (PROAIR HFA) 108 (90 BASE) MCG/ACT inhaler Inhale 2 puffs into the lungs every 6 (six) hours as needed for wheezing.     [provider]  benzonatate (TESSALON) 200 MG capsule Take 1 capsule (200 mg total) by mouth 3  (three) times daily as needed for cough. 12/14/17   Adrian Saran, MD  busPIRone (BUSPAR) 10 MG tablet Take 10 mg by mouth 2 (two) times daily.    [provider]  clopidogrel (PLAVIX) 75 MG tablet Take 75 mg by mouth daily. 11/03/17   [provider]  [facility] SENNA 8.6 MG tablet Take 1 tablet by mouth daily. 10/31/17   [provider]  escitalopram (LEXAPRO) 20 MG tablet Take 1 tablet (20 mg total) at bedtime by mouth. Patient taking differently: Take 30 mg by mouth at bedtime.  07/23/17   Enid Baas, MD  ezetimibe (ZETIA) 10 MG tablet Take 10 mg by mouth daily. 10/31/17   [provider]  Fluticasone-Salmeterol (ADVAIR DISKUS) 500-50 MCG/DOSE AEPB Inhale 1 puff every 12 (twelve) hours into the lungs. 07/23/17 07/23/18  Enid Baas, MD  gabapentin (NEURONTIN) 300 MG capsule Take 300 mg by mouth 3 (three) times daily.    [provider]  ipratropium-albuterol (DUONEB) 0.5-2.5 (3) MG/3ML SOLN Take 3 mLs every 6 (six) hours as needed by nebulization (wheezing). 07/23/17   Enid Baas, MD  isosorbide mononitrate (IMDUR) 30 MG 24 hr tablet Take 30 mg by mouth daily.    [provider]  lisinopril (PRINIVIL,ZESTRIL) 5 MG tablet Take 1 tablet (5 mg total) by mouth daily. 11/30/17   Houston Siren, MD  metoprolol tartrate (LOPRESSOR) 50 MG tablet Take 1 tablet (50 mg total) by mouth 2 (two) times daily. 12/14/17   Adrian Saran, MD  nicotine (NICODERM CQ - DOSED IN MG/24 HOURS) 14 mg/24hr patch Place 1 patch (14 mg total) daily onto the skin. Patient not taking: Reported on 11/25/2017 07/24/17   Enid Baas, MD  nitroGLYCERIN (NITROSTAT) 0.4 MG SL tablet Place 0.4 mg under the tongue every 5 (five) minutes x 3 doses as needed for chest pain. 10/31/17   [provider]  oxyCODONE 10 MG TABS Take 1 tablet (10 mg total) by mouth every 4 (four) hours as needed for moderate pain. 12/14/17   Adrian Saran, MD  pantoprazole (PROTONIX) 40 MG  tablet Take 1 tablet (40 mg total) daily by mouth. Patient not taking: Reported on 12/12/2017 07/23/17   Enid Baas, MD  predniSONE (DELTASONE) 20 MG tablet Take 2 tablets (40 mg total) by mouth daily with breakfast for 4 days. 12/14/17 12/18/17  Adrian Saran, MD  ranitidine (ZANTAC) 150 MG tablet Take 150 mg by mouth 2 (two) times daily.    [provider]  rosuvastatin (CRESTOR) 40 MG tablet Take 40 mg by mouth daily.    [provider]  tiotropium (SPIRIVA HANDIHALER) 18 MCG inhalation capsule Place 1 capsule (18 mcg total) daily into inhaler and inhale. 07/23/17 07/23/18  Enid Baas, MD     VITAL SIGNS:  Blood pressure (!) 134/58, pulse 65, temperature 98.1 F (36.7 C), temperature source Oral, resp. rate (!) 23, height 5\' 4"  (1.626 m), weight 90.7 kg (  200 lb), SpO2 97 %.  PHYSICAL EXAMINATION:  Physical Exam  GENERAL:  61 y.o.-year-old patient lying in the bed with respiratory distress. EYES: Pupils equal, round, reactive to light and accommodation. No scleral icterus. Extraocular muscles intact.  HEENT: Head atraumatic, normocephalic. Oropharynx and nasopharynx clear. No oropharyngeal erythema, moist oral mucosa  NECK:  Supple, no jugular venous distention. No thyroid enlargement, no tenderness.  LUNGS: Decreased breath sounds bilaterally.  Bilateral wheezing. CARDIOVASCULAR: S1, S2 normal. No murmurs, rubs, or gallops.  ABDOMEN: Soft, nontender, nondistended. Bowel sounds present. No organomegaly or mass.  EXTREMITIES: No pedal edema, cyanosis, or clubbing. + 2 pedal & radial pulses b/l.   NEUROLOGIC: Cranial nerves II through XII are intact. No focal Motor or sensory deficits appreciated b/l PSYCHIATRIC: The patient is alert and oriented x 3.  Extremely anxious.  SKIN: No obvious rash, lesion, or ulcer.   LABORATORY PANEL:   CBC Recent Labs  Lab 12/18/17 1355  WBC 11.8*  HGB 11.8*  HCT 36.0*  PLT 339    ------------------------------------------------------------------------------------------------------------------  Chemistries  Recent Labs  Lab 12/18/17 1355  NA 140  K 4.2  CL 102  CO2 32  GLUCOSE 110*  BUN 31*  CREATININE 1.15  CALCIUM 8.8*   ------------------------------------------------------------------------------------------------------------------  Cardiac Enzymes Recent Labs  Lab 12/18/17 1355  TROPONINI 0.05*   ------------------------------------------------------------------------------------------------------------------  RADIOLOGY:  Dg Chest 1 View  Result Date: 12/18/2017 CLINICAL DATA:  Shortness of breath. EXAM: CHEST  1 VIEW COMPARISON:  12/12/2017. FINDINGS: Cardiomegaly. There is there is a focal opacity just above the LEFT costophrenic angle, not clearly present on priors, of uncertain significance. This is ovoid, approximately 1.5 x 3 cm in cross-section. Two-view chest recommended. No central edema or consolidation. No osseous findings. IMPRESSION: Focal ovoid opacity along the LEFT chest wall of uncertain significance. This was not seen on priors. Two-view chest is recommended for further evaluation. No consolidation, edema, or pneumothorax. Electronically Signed   By: Elsie Stain M.D.   On: 12/18/2017 14:12     IMPRESSION AND PLAN:   *Acute COPD exacerbation with acute on chronic hypoxic respiratory failure -IV steroids, Antibiotics - Scheduled Nebulizers - Inhalers -Wean O2 as tolerated - Consult pulmonary if no improvement  *Left lower lobe community-acquired pneumonia It is unclear if this is an infiltrate versus a chest wall mass.  Will get CT chest.  At this time will start patient on IV ceftriaxone and azithromycin. Sputum cultures  *Left rib fracture.  Pain medications added. CT chest pending to rule out hematoma  *Tobacco abuse Counseled to quit > 3 minutes spent  *Diabetes mellitus type 2.  Sliding scale insulin.  Home  medications.  Likely be uncontrolled with hyperglycemia due to IV steroids.  All the records are reviewed and case discussed with ED provider. Management plans discussed with the patient, family and they are in agreement.  CODE STATUS: FULL CODE  TOTAL TIME TAKING CARE OF THIS PATIENT: 40 minutes.   Orie Fisherman M.D on 12/18/2017 at 3:06 PM  Between 7am to 6pm - Pager - [phone]  After 6pm go to [email or web address] - password [email or web address] [facility]  [facility] [place] [facility]  Office  [phone]  CC: Primary care physician; Rayetta Humphrey, MD  Note: This dictation was prepared with Dragon dictation along with smaller phrase technology. Any transcriptional errors that result from this process are unintentional.

## 2017-12-18 NOTE — ED Notes (Signed)
Patient transported to CT 

## 2017-12-18 NOTE — Progress Notes (Signed)
Patient was admitted to room 149 from ED.  Transferred with with SBA. On 3L O2, Sats 92%. Pain 10/10. A&O x4. SOB with activity. Bed alarm on for safety. Oriented to room, call light, TV and bed control. POC reviewed

## 2017-12-18 NOTE — Progress Notes (Signed)
CODE SEPSIS - PHARMACY COMMUNICATION  **Broad Spectrum Antibiotics should be administered within 1 hour of Sepsis diagnosis**  Time Code Sepsis Called/Leiphart Received: 14:22  Antibiotics Ordered: cefepime/vancomycin  Time of 1st antibiotic administration: Orders for cefepime/vancomycin were discontinued by admitting MD before being administered. He wants rocephin 1 gm Q24H for CAP  Additional action taken by pharmacy: 14:51 spoke with RN Lelon MastSamantha - made aware of abx order and time to admin = 15:22.   15:08 called flow coordinator Judeth CornfieldStephanie to see if another RN is available to help.  If necessary, Name of Provider/Nurse Contacted:     Carola FrostNathan A Elga Santy ,PharmD Clinical Pharmacist  12/18/2017  2:28 PM

## 2017-12-18 NOTE — ED Notes (Signed)
Attempted to call report, RN not available at this time. RN to call back for report
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
## 2017-12-18 NOTE — ED Notes (Signed)
Date and time results received: 12/18/17 1448 (use smartphrase ".now" to insert current time)  Test: troponin Critical Value: 0.05  Name of Provider Notified: MD Schaevitz  Orders Received? Or Actions Taken?: Orders Received - See Orders for details

## 2017-12-19 LAB — BLOOD CULTURE ID PANEL (REFLEXED)
Acinetobacter baumannii: NOT DETECTED
CANDIDA KRUSEI: NOT DETECTED
CANDIDA PARAPSILOSIS: NOT DETECTED
CANDIDA TROPICALIS: NOT DETECTED
Candida albicans: NOT DETECTED
Candida glabrata: NOT DETECTED
ESCHERICHIA COLI: NOT DETECTED
Enterobacter cloacae complex: NOT DETECTED
Enterobacteriaceae species: NOT DETECTED
Enterococcus species: NOT DETECTED
Haemophilus influenzae: NOT DETECTED
KLEBSIELLA PNEUMONIAE: NOT DETECTED
Klebsiella oxytoca: NOT DETECTED
LISTERIA MONOCYTOGENES: NOT DETECTED
Methicillin resistance: DETECTED — AB
Neisseria meningitidis: NOT DETECTED
PROTEUS SPECIES: NOT DETECTED
Pseudomonas aeruginosa: NOT DETECTED
SERRATIA MARCESCENS: NOT DETECTED
STAPHYLOCOCCUS AUREUS BCID: NOT DETECTED
STAPHYLOCOCCUS SPECIES: DETECTED — AB
Streptococcus agalactiae: NOT DETECTED
Streptococcus pneumoniae: NOT DETECTED
Streptococcus pyogenes: NOT DETECTED
Streptococcus species: NOT DETECTED

## 2017-12-19 LAB — BASIC METABOLIC PANEL
Anion gap: 8 (ref 5–15)
BUN: 33 mg/dL — AB (ref 6–20)
CO2: 28 mmol/L (ref 22–32)
CREATININE: 0.85 mg/dL (ref 0.61–1.24)
Calcium: 8.3 mg/dL — ABNORMAL LOW (ref 8.9–10.3)
Chloride: 100 mmol/L — ABNORMAL LOW (ref 101–111)
GFR calc Af Amer: >60 mL/min (ref >60)
GFR calc non Af Amer: >60 mL/min (ref >60)
Glucose, Bld: 165 mg/dL — ABNORMAL HIGH (ref 65–99)
POTASSIUM: 4.6 mmol/L (ref 3.5–5.1)
Sodium: 136 mmol/L (ref 135–145)

## 2017-12-19 LAB — GLUCOSE, CAPILLARY
GLUCOSE-CAPILLARY: 120 mg/dL — AB (ref 65–99)
GLUCOSE-CAPILLARY: 199 mg/dL — AB (ref 65–99)
Glucose-Capillary: 129 mg/dL — ABNORMAL HIGH (ref 65–99)
Glucose-Capillary: 152 mg/dL — ABNORMAL HIGH (ref 65–99)

## 2017-12-19 LAB — CBC
HEMATOCRIT: 34.6 % — AB (ref 40.0–52.0)
Hemoglobin: 11.3 g/dL — ABNORMAL LOW (ref 13.0–18.0)
MCH: 29.7 pg (ref 26.0–34.0)
MCHC: 32.6 g/dL (ref 32.0–36.0)
MCV: 91.1 fL (ref 80.0–100.0)
PLATELETS: 312 10*3/uL (ref 150–440)
RBC: 3.79 MIL/uL — AB (ref 4.40–5.90)
RDW: 15.6 % — AB (ref 11.5–14.5)
WBC: 13.7 10*3/uL — ABNORMAL HIGH (ref 3.8–10.6)

## 2017-12-19 MED ORDER — HYDROCOD POLST-CPM POLST ER 10-8 MG/5ML PO SUER
5.0000 mL | Freq: Two times a day (BID) | ORAL | Status: DC | PRN
  Administered 2017-12-19 – 2017-12-24 (×7): 5 mL via ORAL
  Filled 2017-12-19 (×7): qty 5

## 2017-12-19 MED ORDER — BUDESONIDE 0.5 MG/2ML IN SUSP
0.5000 mg | Freq: Two times a day (BID) | RESPIRATORY_TRACT | Status: DC
  Administered 2017-12-19 – 2017-12-24 (×10): 0.5 mg via RESPIRATORY_TRACT
  Filled 2017-12-19 (×10): qty 2

## 2017-12-19 MED ORDER — ESCITALOPRAM OXALATE 10 MG PO TABS
30.0000 mg | ORAL_TABLET | Freq: Every day | ORAL | Status: DC
  Administered 2017-12-19 – 2017-12-23 (×5): 30 mg via ORAL
  Filled 2017-12-19 (×5): qty 3

## 2017-12-19 NOTE — Progress Notes (Signed)
PHARMACY - PHYSICIAN COMMUNICATION CRITICAL VALUE ALERT - BLOOD CULTURE IDENTIFICATION (BCID)  Results for orders placed or performed during the hospital encounter of 12/18/17  Blood Culture ID Panel (Reflexed) (Collected: 12/18/2017  3:15 PM)  Result Value Ref Range   Enterococcus species NOT DETECTED NOT DETECTED   Listeria monocytogenes NOT DETECTED NOT DETECTED   Staphylococcus species DETECTED (A) NOT DETECTED   Staphylococcus aureus NOT DETECTED NOT DETECTED   Methicillin resistance DETECTED (A) NOT DETECTED   Streptococcus species NOT DETECTED NOT DETECTED   Streptococcus agalactiae NOT DETECTED NOT DETECTED   Streptococcus pneumoniae NOT DETECTED NOT DETECTED   Streptococcus pyogenes NOT DETECTED NOT DETECTED   Acinetobacter baumannii NOT DETECTED NOT DETECTED   Enterobacteriaceae species NOT DETECTED NOT DETECTED   Enterobacter cloacae complex NOT DETECTED NOT DETECTED   Escherichia coli NOT DETECTED NOT DETECTED   Klebsiella oxytoca NOT DETECTED NOT DETECTED   Klebsiella pneumoniae NOT DETECTED NOT DETECTED   Proteus species NOT DETECTED NOT DETECTED   Serratia marcescens NOT DETECTED NOT DETECTED   Haemophilus influenzae NOT DETECTED NOT DETECTED   Neisseria meningitidis NOT DETECTED NOT DETECTED   Pseudomonas aeruginosa NOT DETECTED NOT DETECTED   Candida albicans NOT DETECTED NOT DETECTED   Candida glabrata NOT DETECTED NOT DETECTED   Candida krusei NOT DETECTED NOT DETECTED   Candida parapsilosis NOT DETECTED NOT DETECTED   Candida tropicalis NOT DETECTED NOT DETECTED    Name of physician (or Provider) Contacted:  [place]   Changes to prescribed antibiotics required: No, will continue pt on current regimen (Ceftriaxone and Azithromycin).   Tonnie Stillman D 12/19/2017  4:44 PM
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
## 2017-12-20 LAB — GLUCOSE, CAPILLARY
GLUCOSE-CAPILLARY: 154 mg/dL — AB (ref 65–99)
GLUCOSE-CAPILLARY: 83 mg/dL (ref 65–99)
Glucose-Capillary: 105 mg/dL — ABNORMAL HIGH (ref 65–99)
Glucose-Capillary: 176 mg/dL — ABNORMAL HIGH (ref 65–99)

## 2017-12-20 MED ORDER — ACETYLCYSTEINE 20 % IN SOLN
3.0000 mL | Freq: Two times a day (BID) | RESPIRATORY_TRACT | Status: DC
  Administered 2017-12-20 – 2017-12-22 (×5): 3 mL via RESPIRATORY_TRACT
  Administered 2017-12-22: 4 mL via RESPIRATORY_TRACT
  Administered 2017-12-23 – 2017-12-24 (×3): 3 mL via RESPIRATORY_TRACT
  Filled 2017-12-20 (×10): qty 4

## 2017-12-20 MED ORDER — OXYCODONE HCL 5 MG PO TABS
10.0000 mg | ORAL_TABLET | ORAL | Status: DC | PRN
  Administered 2017-12-20 – 2017-12-24 (×18): 10 mg via ORAL
  Filled 2017-12-20 (×18): qty 2

## 2017-12-20 NOTE — Care Management (Addendum)
Consult received for medication needs. Patient has Medicaid. His prescriptions are all $3. RNCM can offer not further assistance.
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
## 2017-12-20 NOTE — Progress Notes (Signed)
Patient ID: Kyle Rich, male   DOB: 1956/01/16, 62 y.o.   MRN: [id number]  [employer] PROGRESS NOTE  Kyle Rich WUJ:[id number] DOB: March 02, 1956 DOA: 12/18/2017 PCP: Kyle Humphrey, MD  HPI/Subjective: Patient states he feels worse today.  States he has anxiety about his breathing.  Still coughing wheezing and short of breath.  Having severe pain in his ribs.  Objective: Vitals:   12/20/17 0848 12/20/17 1340  BP:    Pulse:    Resp:    Temp:    SpO2: 93% 92%    Filed Weights   12/18/17 1325 12/18/17 1605 12/20/17 0444  Weight: 90.7 kg (200 lb) 93 kg (205 lb) 88.6 kg (195 lb 4.8 oz)    ROS: Review of Systems  Constitutional: Negative for chills and fever.  Eyes: Negative for blurred vision.  Respiratory: Positive for cough, shortness of breath and wheezing.   Cardiovascular: Negative for chest pain.  Gastrointestinal: Negative for abdominal pain, constipation, diarrhea, nausea and vomiting.  Genitourinary: Negative for dysuria.  Musculoskeletal: Positive for joint pain.  Neurological: Negative for dizziness and headaches.   Exam: Physical Exam  Constitutional: He is oriented to person, place, and time.  HENT:  Nose: No mucosal edema.  Mouth/Throat: No oropharyngeal exudate or posterior oropharyngeal edema.  Eyes: Pupils are equal, round, and reactive to light. Conjunctivae, EOM and lids are normal.  Neck: No JVD present. Carotid bruit is not present. No edema present. No thyroid mass and no thyromegaly present.  Cardiovascular: S1 normal and S2 normal. Exam reveals no gallop.  No murmur heard. Pulses:      Dorsalis pedis pulses are 2+ on the right side, and 2+ on the left side.  Respiratory: No respiratory distress. He has decreased breath sounds in the right middle field, the right lower field, the left middle field and the left lower field. He has wheezes in the right middle field, the right lower field, the left middle field and the left lower field. He has no  rhonchi. He has no rales.  GI: Soft. Bowel sounds are normal. There is no tenderness.  Musculoskeletal:       Right ankle: He exhibits no swelling.       Left ankle: He exhibits no swelling.  Lymphadenopathy:    He has no cervical adenopathy.  Neurological: He is alert and oriented to person, place, and time. No cranial nerve deficit.  Skin: Skin is warm. No rash noted. Nails show no clubbing.  Psychiatric: He has a normal mood and affect.      Data Reviewed: Basic Metabolic Panel: Recent Labs  Lab 12/18/17 1355 12/19/17 0853  NA 140 136  K 4.2 4.6  CL 102 100*  CO2 32 28  GLUCOSE 110* 165*  BUN 31* 33*  CREATININE 1.15 0.85  CALCIUM 8.8* 8.3*   CBC: Recent Labs  Lab 12/18/17 1355 12/19/17 0853  WBC 11.8* 13.7*  NEUTROABS 6.3  --   HGB 11.8* 11.3*  HCT 36.0* 34.6*  MCV 90.8 91.1  PLT 339 312   Cardiac Enzymes: Recent Labs  Lab 12/13/17 1652 12/18/17 1355  TROPONINI 0.05* 0.05*   BNP (last 3 results) Recent Labs    07/19/17 0826 11/24/17 0621 12/18/17 1355  BNP 246.0* 384.0* 186.0*     CBG: Recent Labs  Lab 12/19/17 1212 12/19/17 1708 12/19/17 2117 12/20/17 0806 12/20/17 1208  GLUCAP 129* 152* 199* 105* 154*    Recent Results (from the past 240 hour(s))  Blood Culture (routine  x 2)     Status: None (Preliminary result)   Collection Time: 12/18/17  3:15 PM  Result Value Ref Range Status   Specimen Description   Final    BLOOD BLOOD RIGHT HAND Performed at [facility], [address] [place], [place] [address]    Special Requests   Final    BOTTLES DRAWN AEROBIC AND ANAEROBIC Blood Culture adequate volume Performed at [facility], [address] [place], [place] [address]    Culture  Setup Time   Final    Organism ID to follow GRAM POSITIVE COCCI AEROBIC BOTTLE ONLY CRITICAL RESULT CALLED TO, READ BACK BY AND VERIFIED WITH: JASON ROBBINS AT 1632 ON 12/19/2017 JJB Performed at [facility], [address] [place], [place] [address]    Culture   Final    Romie MinusGRAM POSITIVE COCCI CULTURE REINCUBATED FOR BETTER GROWTH Performed at [facility], [address] [place], [place] [address]    Report Status PENDING  Incomplete  Blood Culture (routine x 2)     Status: None (Preliminary result)   Collection Time: 12/18/17  3:15 PM  Result Value Ref Range Status   Specimen Description BLOOD RIGHT ANTECUBITAL  Final   Special Requests   Final    BOTTLES DRAWN AEROBIC AND ANAEROBIC Blood Culture adequate volume   Culture  Setup Time   Final    GRAM POSITIVE COCCI ANAEROBIC BOTTLE ONLY CRITICAL VALUE NOTED.  VALUE IS CONSISTENT WITH PREVIOUSLY REPORTED AND CALLED VALUE. Performed at [facility], [address] [place], [place] [address]    Culture [facility]I  Final   Report Status PENDING  Incomplete  Blood Culture ID Panel (Reflexed)     Status: Abnormal   Collection Time: 12/18/17  3:15 PM  Result Value Ref Range Status   Enterococcus species NOT DETECTED NOT DETECTED Final   Listeria monocytogenes NOT DETECTED NOT DETECTED Final   Staphylococcus species DETECTED (A) NOT DETECTED Final    Comment: Methicillin (oxacillin) resistant coagulase negative staphylococcus. Possible blood culture contaminant (unless isolated from more than one blood culture draw or clinical case suggests pathogenicity). No antibiotic treatment is indicated for blood  culture contaminants. CRITICAL RESULT CALLED TO, READ BACK BY AND VERIFIED WITH: JASON ROBBINS AT 1632 ON 12/19/2017 JJB    Staphylococcus aureus NOT DETECTED NOT DETECTED Final   Methicillin resistance DETECTED (A) NOT DETECTED Final    Comment: CRITICAL RESULT CALLED TO, READ BACK BY AND VERIFIED WITH: JASON ROBBINS AT 1632 ON 12/19/2017 JJB    Streptococcus species NOT DETECTED NOT DETECTED Final   Streptococcus agalactiae NOT DETECTED NOT DETECTED Final   Streptococcus pneumoniae NOT DETECTED NOT DETECTED Final    Streptococcus pyogenes NOT DETECTED NOT DETECTED Final   Acinetobacter baumannii NOT DETECTED NOT DETECTED Final   Enterobacteriaceae species NOT DETECTED NOT DETECTED Final   Enterobacter cloacae complex NOT DETECTED NOT DETECTED Final   Escherichia coli NOT DETECTED NOT DETECTED Final   Klebsiella oxytoca NOT DETECTED NOT DETECTED Final   Klebsiella pneumoniae NOT DETECTED NOT DETECTED Final   Proteus species NOT DETECTED NOT DETECTED Final   Serratia marcescens NOT DETECTED NOT DETECTED Final   Haemophilus influenzae NOT DETECTED NOT DETECTED Final   Neisseria meningitidis NOT DETECTED NOT DETECTED Final   Pseudomonas aeruginosa NOT DETECTED NOT DETECTED Final   Candida albicans NOT DETECTED NOT DETECTED Final   Candida glabrata NOT DETECTED NOT DETECTED Final   Candida krusei NOT DETECTED NOT  DETECTED Final   Candida parapsilosis NOT DETECTED NOT DETECTED Final   Candida tropicalis NOT DETECTED NOT DETECTED Final    Comment: Performed at [facility], [address] [place], [place] [address]     Studies: Dg Chest 1 View  Result Date: 12/18/2017 CLINICAL DATA:  Shortness of breath. EXAM: CHEST  1 VIEW COMPARISON:  12/12/2017. FINDINGS: Cardiomegaly. There is there is a focal opacity just above the LEFT costophrenic angle, not clearly present on priors, of uncertain significance. This is ovoid, approximately 1.5 x 3 cm in cross-section. Two-view chest recommended. No central edema or consolidation. No osseous findings. IMPRESSION: Focal ovoid opacity along the LEFT chest wall of uncertain significance. This was not seen on priors. Two-view chest is recommended for further evaluation. No consolidation, edema, or pneumothorax. Electronically Signed   By: Elsie Stain M.D.   On: 12/18/2017 14:12   Ct Chest Wo Contrast  Result Date: 12/18/2017 CLINICAL DATA:  Hypertension and diabetes. COPD. Tobacco use. Worsening shortness of breath and wheezing. Abnormal density in the left  lower chest by radiography. EXAM: CT CHEST WITHOUT CONTRAST TECHNIQUE: Multidetector CT imaging of the chest was performed following the standard protocol without IV contrast. COMPARISON:  Radiography same day.  CT 07/03/2017. FINDINGS: Cardiovascular: Aortic atherosclerosis. Extensive coronary artery calcification. Heart size is normal. Mediastinum/Nodes: No sign of mass or lymphadenopathy. Lungs/Pleura: Background pattern of centrilobular emphysema. No focal or acute process seen otherwise on the right. On the left, there is a small amount of dependent pleural fluid. There is worsened patchy density in the lingula most consistent with mild lingular pneumonia. Atelectasis and or scarring could have this appearance, but the findings are clearly progressive compared to the CT of last October. Additionally, plain radiographs have changed over the last week. Upper Abdomen: Negative Musculoskeletal: Negative IMPRESSION: New patchy density in the lingula most consistent with lingular pneumonia. Aortic Atherosclerosis (ICD10-I70.0) and Emphysema (ICD10-J43.9). Extensive coronary artery calcification as well. Electronically Signed   By: Paulina Fusi M.D.   On: 12/18/2017 15:39    Scheduled Meds: . aspirin EC  81 mg Oral Daily  . azithromycin  500 mg Oral Daily  . budesonide (PULMICORT) nebulizer solution  0.5 mg Nebulization BID  . busPIRone  10 mg Oral BID  . clopidogrel  75 mg Oral Daily  . docusate sodium  100 mg Oral BID  . enoxaparin (LOVENOX) injection  40 mg Subcutaneous Q24H  . escitalopram  30 mg Oral QHS  . ezetimibe  10 mg Oral Daily  . gabapentin  300 mg Oral TID  . insulin aspart  0-15 Units Subcutaneous TID WC  . insulin aspart  0-5 Units Subcutaneous QHS  . ipratropium-albuterol  3 mL Nebulization Q6H  . isosorbide mononitrate  30 mg Oral Daily  . lisinopril  5 mg Oral Daily  . metFORMIN  500 mg Oral BID WC  . methylPREDNISolone (SOLU-MEDROL) injection  60 mg Intravenous Q12H  .  metoprolol tartrate  50 mg Oral BID  . nicotine  14 mg Transdermal Daily  . tiotropium  1 capsule Inhalation Daily   Continuous Infusions: . cefTRIAXone (ROCEPHIN)  IV Stopped (12/19/17 1759)    Assessment/Plan:  1. Acute on chronic hypoxic respiratory failure.  Continue oxygen supplementation.  Normally wears 2 L. 2. COPD exacerbation.  Continue Solu-Medrol 60 mg IV every 12 hours.  Change Dulera inhaler over to budesonide.  Continue DuoNeb nebulizer solution 3. Pneumonia left lower lobe.  Patient on Rocephin and Zithromax 4. Essential hypertension  on lisinopril, metoprolol. 5. History of coronary artery disease on aspirin, Plavix, metoprolol and restart Crestor. 6. Tobacco abuse on nicotine patch 7. Rib fracture.  Patient asking to increase his Roxicodone to 10 mg. 8. Type 2 diabetes mellitus on sliding scale insulin and metformin 9. History of hepatitis C 10. History of chronic systolic congestive heart failure.  Currently no signs.  Code Status:     Code Status Orders  (From admission, onward)        Start     Ordered   12/18/17 1503  Full code  Continuous     12/18/17 1504    Code Status History    Date Active Date Inactive Code Status Order ID Comments User Context   12/12/2017 2256 12/14/2017 1612 Full Code [id number]  Oralia Manis, MD Inpatient   11/24/2017 0808 11/29/2017 1527 Full Code [id number]  Alford Highland, MD ED   07/19/2017 1030 07/23/2017 1513 Full Code [id number]  Alford Highland, MD ED   07/08/2017 1412 07/08/2017 1913 Partial Code [id number]  Milagros Loll, MD Inpatient   07/03/2017 0111 07/08/2017 1412 Full Code [id number]  Bertrum Sol, MD Inpatient   06/15/2016 1007 06/15/2016 2242 Full Code [id number]  Shaune Pollack, MD Inpatient   06/09/2015 1159 06/10/2015 1527 Full Code [id number]  Houston Siren, MD Inpatient    Advance Directive Documentation     Most Recent Value  Type of Advance Directive  Healthcare Power of Attorney  Pre-existing out of  facility DNR order (yellow form or pink MOST form)  -  "MOST" Form in Place?  -     Time spent: 27 minutes  Terrance Usery [employer]

## 2017-12-21 LAB — GLUCOSE, CAPILLARY
GLUCOSE-CAPILLARY: 104 mg/dL — AB (ref 65–99)
GLUCOSE-CAPILLARY: 154 mg/dL — AB (ref 65–99)
GLUCOSE-CAPILLARY: 105 mg/dL — AB (ref 65–99)
Glucose-Capillary: 171 mg/dL — ABNORMAL HIGH (ref 65–99)

## 2017-12-21 NOTE — Progress Notes (Signed)
Patient ID: Kyle Rich, male   DOB: [birth year], 62 y.o.   MRN: [id number]   [employer] PROGRESS NOTE  Kyle Rich DOB: [birth year] DOA: 12/18/2017 PCP: Kyle Rich, Kyle Rich, Kyle Rich  HPI/Subjective: Patient feeling slightly better today.  Still coughing wheezing and shortness of breath.  Still having Rich lot of rib pain but better controlled today with increased pain medication.  Objective: Vitals:   12/21/17 0721 12/21/17 0741  BP:  119/90  Pulse:  66  Resp:    Temp:  (!) 97.4 F (36.3 C)  SpO2: 93% 99%    Filed Weights   12/18/17 1605 12/20/17 0444 12/21/17 0431  Weight: 93 kg (205 lb) 88.6 kg (195 lb 4.8 oz) 85.6 kg (188 lb 11.4 oz)    ROS: Review of Systems  Constitutional: Negative for chills and fever.  Eyes: Negative for blurred vision.  Respiratory: Positive for cough, shortness of breath and wheezing.   Cardiovascular: Negative for chest pain.  Gastrointestinal: Negative for abdominal pain, constipation, diarrhea, nausea and vomiting.  Genitourinary: Negative for dysuria.  Musculoskeletal: Positive for joint pain.  Neurological: Negative for dizziness and headaches.   Exam: Physical Exam  Constitutional: He is oriented to person, place, and time.  HENT:  Nose: No mucosal edema.  Mouth/Throat: No oropharyngeal exudate or posterior oropharyngeal edema.  Eyes: Pupils are equal, round, and reactive to light. Conjunctivae, EOM and lids are normal.  Neck: No JVD present. Carotid bruit is not present. No edema present. No thyroid mass and no thyromegaly present.  Cardiovascular: S1 normal and S2 normal. Exam reveals no gallop.  No murmur heard. Pulses:      Dorsalis pedis pulses are 2+ on the right side, and 2+ on the left side.  Respiratory: No respiratory distress. He has decreased breath sounds in the right middle field, the right lower field, the left middle field and the left lower field. He has wheezes in the right middle field, the right lower field,  the left middle field and the left lower field. He has no rhonchi. He has no rales.  GI: Soft. Bowel sounds are normal. There is no tenderness.  Musculoskeletal:       Right ankle: He exhibits no swelling.       Left ankle: He exhibits no swelling.  Lymphadenopathy:    He has no cervical adenopathy.  Neurological: He is alert and oriented to person, place, and time. No cranial nerve deficit.  Skin: Skin is warm. No rash noted. Nails show no clubbing.  Psychiatric: He has Rich normal mood and affect.      Data Reviewed: Basic Metabolic Panel: Recent Labs  Lab 12/18/17 1355 12/19/17 0853  NA 140 136  K 4.2 4.6  CL 102 100*  CO2 32 28  GLUCOSE 110* 165*  BUN 31* 33*  CREATININE 1.15 0.85  CALCIUM 8.8* 8.3*   CBC: Recent Labs  Lab 12/18/17 1355 12/19/17 0853  WBC 11.8* 13.7*  NEUTROABS 6.3  --   HGB 11.8* 11.3*  HCT 36.0* 34.6*  MCV 90.8 91.1  PLT 339 312   Cardiac Enzymes: Recent Labs  Lab 12/18/17 1355  TROPONINI 0.05*   BNP (last 3 results) Recent Labs    07/19/17 0826 11/24/17 0621 12/18/17 1355  BNP 246.0* 384.0* 186.0*     CBG: Recent Labs  Lab 12/20/17 0806 12/20/17 1208 12/20/17 1713 12/20/17 2225 12/21/17 0820  GLUCAP 105* 154* 83 176* 104*    Recent Results (from the past 240 hour(s))  Blood Culture (routine x 2)     Status: None (Preliminary result)   Collection Time: 12/18/17  3:15 PM  Result Value Ref Range Status   Specimen Description   Final    BLOOD BLOOD RIGHT HAND Performed at [facility], [address], [place], [place] [address]    Special Requests   Final    BOTTLES DRAWN AEROBIC AND ANAEROBIC Blood Culture adequate volume Performed at [facility], [address] [place], [place] [address]    Culture  Setup Time   Final    Organism ID to follow GRAM POSITIVE COCCI AEROBIC BOTTLE ONLY CRITICAL RESULT CALLED TO, READ BACK BY AND VERIFIED WITH: JASON ROBBINS AT 1632 ON 12/19/2017 JJB Performed at  [facility], [address], [place], [place] [address]    Culture   Final    Romie Minus POSITIVE COCCI CULTURE REINCUBATED FOR BETTER GROWTH Performed at [facility], [address], [place], [place] [address]    Report Status PENDING  Incomplete  Blood Culture (routine x 2)     Status: None (Preliminary result)   Collection Time: 12/18/17  3:15 PM  Result Value Ref Range Status   Specimen Description   Final    BLOOD RIGHT ANTECUBITAL Performed at [facility], [address] [place], [place] [address]    Special Requests   Final    BOTTLES DRAWN AEROBIC AND ANAEROBIC Blood Culture adequate volume Performed at [facility], [address], [place], [place] [address]    Culture  Setup Time   Final    GRAM POSITIVE COCCI ANAEROBIC BOTTLE ONLY CRITICAL VALUE NOTED.  VALUE IS CONSISTENT WITH PREVIOUSLY REPORTED AND CALLED VALUE. Performed at [facility], [address] [place], [place] [address]    Culture   Final    Romie Minus POSITIVE COCCI CULTURE REINCUBATED FOR BETTER GROWTH Performed at [facility], [address] [place], [place] [address]    Report Status PENDING  Incomplete  Blood Culture ID Panel (Reflexed)     Status: Abnormal   Collection Time: 12/18/17  3:15 PM  Result Value Ref Range Status   Enterococcus species NOT DETECTED NOT DETECTED Final   Listeria monocytogenes NOT DETECTED NOT DETECTED Final   Staphylococcus species DETECTED (Rich) NOT DETECTED Final    Comment: Methicillin (oxacillin) resistant coagulase negative staphylococcus. Possible blood culture contaminant (unless isolated from more than one blood culture draw or clinical case suggests pathogenicity). No antibiotic treatment is indicated for blood  culture contaminants. CRITICAL RESULT CALLED TO, READ BACK BY AND VERIFIED WITH: JASON ROBBINS AT 1632 ON 12/19/2017 JJB    Staphylococcus aureus NOT DETECTED NOT DETECTED Final   Methicillin resistance  DETECTED (Rich) NOT DETECTED Final    Comment: CRITICAL RESULT CALLED TO, READ BACK BY AND VERIFIED WITH: JASON ROBBINS AT 1632 ON 12/19/2017 JJB    Streptococcus species NOT DETECTED NOT DETECTED Final   Streptococcus agalactiae NOT DETECTED NOT DETECTED Final   Streptococcus pneumoniae NOT DETECTED NOT DETECTED Final   Streptococcus pyogenes NOT DETECTED NOT DETECTED Final   Acinetobacter baumannii NOT DETECTED NOT DETECTED Final   Enterobacteriaceae species NOT DETECTED NOT DETECTED Final   Enterobacter cloacae complex NOT DETECTED NOT DETECTED Final   Escherichia coli NOT DETECTED NOT DETECTED Final   Klebsiella oxytoca NOT DETECTED NOT DETECTED Final   Klebsiella pneumoniae NOT DETECTED NOT DETECTED Final   Proteus species NOT DETECTED NOT DETECTED Final   Serratia marcescens NOT  DETECTED NOT DETECTED Final   Haemophilus influenzae NOT DETECTED NOT DETECTED Final   Neisseria meningitidis NOT DETECTED NOT DETECTED Final   Pseudomonas aeruginosa NOT DETECTED NOT DETECTED Final   Candida albicans NOT DETECTED NOT DETECTED Final   Candida glabrata NOT DETECTED NOT DETECTED Final   Candida krusei NOT DETECTED NOT DETECTED Final   Candida parapsilosis NOT DETECTED NOT DETECTED Final   Candida tropicalis NOT DETECTED NOT DETECTED Final    Comment: Performed at [facility], [address] [place], [place] [address]     Scheduled Meds: . acetylcysteine  3 mL Nebulization BID  . aspirin EC  81 mg Oral Daily  . azithromycin  500 mg Oral Daily  . budesonide (PULMICORT) nebulizer solution  0.5 mg Nebulization BID  . busPIRone  10 mg Oral BID  . clopidogrel  75 mg Oral Daily  . docusate sodium  100 mg Oral BID  . enoxaparin (LOVENOX) injection  40 mg Subcutaneous Q24H  . escitalopram  30 mg Oral QHS  . ezetimibe  10 mg Oral Daily  . gabapentin  300 mg Oral TID  . insulin aspart  0-15 Units Subcutaneous TID WC  . insulin aspart  0-5 Units Subcutaneous QHS  .  ipratropium-albuterol  3 mL Nebulization Q6H  . isosorbide mononitrate  30 mg Oral Daily  . lisinopril  5 mg Oral Daily  . metFORMIN  500 mg Oral BID WC  . methylPREDNISolone (SOLU-MEDROL) injection  60 mg Intravenous Q12H  . metoprolol tartrate  50 mg Oral BID  . nicotine  14 mg Transdermal Daily  . tiotropium  1 capsule Inhalation Daily   Continuous Infusions: . cefTRIAXone (ROCEPHIN)  IV 1 g (12/20/17 1811)    Assessment/Plan:  1. Acute on chronic hypoxic respiratory failure.  Continue oxygen supplementation.  Normally wears 2 L. 2. COPD exacerbation.  Continue Solu-Medrol 60 mg IV every 12 hours.  Continue DuoNeb nebulizer solution and budesonide nebulizers. 3. Pneumonia left lower lobe.  Patient on Rocephin and Zithromax 4. Essential hypertension on lisinopril, metoprolol. 5. History of coronary artery disease on aspirin, Plavix, metoprolol and restart Crestor. 6. Tobacco abuse on nicotine patch 7. Rib fracture.  Roxicodone to 10 mg as needed pain 8. Type 2 diabetes mellitus on sliding scale insulin and metformin 9. History of hepatitis C 10. History of chronic systolic congestive heart failure.  Currently no signs.  Code Status:     Code Status Orders  (From admission, onward)        Start     Ordered   12/18/17 1503  Full code  Continuous     12/18/17 1504    Code Status History    Date Active Date Inactive Code Status Order ID Comments User Context   12/12/2017 2256 12/14/2017 1612 Full Code [id number]  Oralia Manis, Kyle Rich Inpatient   11/24/2017 0808 11/29/2017 1527 Full Code [id number]  Alford Highland, Kyle Rich ED   07/19/2017 1030 07/23/2017 1513 Full Code [id number]  Alford Highland, Kyle Rich ED   07/08/2017 1412 07/08/2017 1913 Partial Code [id number]  Milagros Loll, Kyle Rich Inpatient   07/03/2017 0111 07/08/2017 1412 Full Code [id number]  Bertrum Sol, Kyle Rich Inpatient   06/15/2016 1007 06/15/2016 2242 Full Code [id number]  Shaune Pollack, Kyle Rich Inpatient   06/09/2015 1159 06/10/2015 1527 Full  Code [id number]  Houston Siren, Kyle Rich Inpatient    Advance Directive Documentation     Most Recent Value  Type of Advance Directive  Healthcare Power of Attorney  Pre-existing out of  facility DNR order (yellow form or pink MOST form)  -  "MOST" Form in Place?  -     Time spent: 25 minutes  Kiyona Mcnall [employer]

## 2017-12-22 ENCOUNTER — Encounter: Payer: Self-pay | Admitting: Radiology

## 2017-12-22 ENCOUNTER — Inpatient Hospital Stay: Payer: Medicaid Other

## 2017-12-22 LAB — PROCALCITONIN

## 2017-12-22 LAB — GLUCOSE, CAPILLARY
GLUCOSE-CAPILLARY: 118 mg/dL — AB (ref 65–99)
GLUCOSE-CAPILLARY: 207 mg/dL — AB (ref 65–99)
Glucose-Capillary: 180 mg/dL — ABNORMAL HIGH (ref 65–99)
Glucose-Capillary: 167 mg/dL — ABNORMAL HIGH (ref 65–99)

## 2017-12-22 LAB — CULTURE, BLOOD (ROUTINE X 2)
Special Requests: ADEQUATE
Special Requests: ADEQUATE

## 2017-12-22 MED ORDER — SODIUM CHLORIDE 0.9 % IV SOLN
1.0000 g | INTRAVENOUS | Status: AC
  Administered 2017-12-22: 1 g via INTRAVENOUS
  Filled 2017-12-22: qty 10

## 2017-12-22 MED ORDER — METHYLPREDNISOLONE SODIUM SUCC 125 MG IJ SOLR
60.0000 mg | Freq: Four times a day (QID) | INTRAMUSCULAR | Status: DC
  Administered 2017-12-22 – 2017-12-24 (×7): 60 mg via INTRAVENOUS
  Filled 2017-12-22 (×7): qty 2

## 2017-12-22 MED ORDER — IPRATROPIUM-ALBUTEROL 0.5-2.5 (3) MG/3ML IN SOLN
3.0000 mL | RESPIRATORY_TRACT | Status: DC
  Administered 2017-12-22 – 2017-12-24 (×12): 3 mL via RESPIRATORY_TRACT
  Filled 2017-12-22 (×12): qty 3

## 2017-12-22 MED ORDER — IOPAMIDOL (ISOVUE-300) INJECTION 61%
75.0000 mL | Freq: Once | INTRAVENOUS | Status: AC | PRN
  Administered 2017-12-22: 75 mL via INTRAVENOUS

## 2017-12-22 NOTE — Plan of Care (Signed)
  Problem: Education: Goal: Knowledge of General Education information will improve Outcome: Progressing   

## 2017-12-22 NOTE — Progress Notes (Signed)
Patient ID: Kyle Rich, male   DOB: 09/21/1955, 62 y.o.   MRN: [id number]   [facility] PROGRESS NOTE  Kyle Rich WUJ:[id number] DOB: 09/21/1955 DOA: 12/18/2017 PCP: [name], Sionne A, MD  HPI/Subjective: Called by nurse due to patient requiring higher oxygen he is was on 6 L of oxygen.  Patient states that he is little more short of breath this morning.  Objective: Vitals:   12/22/17 0902 12/22/17 0922  BP:    Pulse: 72   Resp:    Temp:    SpO2:  90%    Filed Weights   12/20/17 0444 12/21/17 0431 12/22/17 0500  Weight: 88.6 kg (195 lb 4.8 oz) 85.6 kg (188 lb 11.4 oz) 87.3 kg (192 lb 6.4 oz)    ROS: Review of Systems  Constitutional: Negative for chills and fever.  Eyes: Negative for blurred vision.  Respiratory: Positive for cough, shortness of breath and wheezing.   Cardiovascular: Negative for chest pain.  Gastrointestinal: Negative for abdominal pain, constipation, diarrhea, nausea and vomiting.  Genitourinary: Negative for dysuria.  Musculoskeletal: Positive for joint pain.  Neurological: Negative for dizziness and headaches.   Exam: Physical Exam  Constitutional: He is oriented to person, place, and time.  HENT:  Nose: No mucosal edema.  Mouth/Throat: No oropharyngeal exudate or posterior oropharyngeal edema.  Eyes: Pupils are equal, round, and reactive to light. Conjunctivae, EOM and lids are normal.  Neck: No JVD present. Carotid bruit is not present. No edema present. No thyroid mass and no thyromegaly present.  Cardiovascular: S1 normal and S2 normal. Exam reveals no gallop.  No murmur heard. Pulses:      Dorsalis pedis pulses are 2+ on the right side, and 2+ on the left side.  Respiratory: No respiratory distress. He has decreased breath sounds in the right middle field, the right lower field, the left middle field and the left lower field. He has wheezes in the right middle field, the right lower field, the left middle field and the left lower field.  He has no rhonchi. He has no rales.  GI: Soft. Bowel sounds are normal. There is no tenderness.  Musculoskeletal:       Right ankle: He exhibits no swelling.       Left ankle: He exhibits no swelling.  Lymphadenopathy:    He has no cervical adenopathy.  Neurological: He is alert and oriented to person, place, and time. No cranial nerve deficit.  Skin: Skin is warm. No rash noted. Nails show no clubbing.  Psychiatric: He has a normal mood and affect.      Data Reviewed: Basic Metabolic Panel: Recent Labs  Lab 12/18/17 1355 12/19/17 0853  NA 140 136  K 4.2 4.6  CL 102 100*  CO2 32 28  GLUCOSE 110* 165*  BUN 31* 33*  CREATININE 1.15 0.85  CALCIUM 8.8* 8.3*   CBC: Recent Labs  Lab 12/18/17 1355 12/19/17 0853  WBC 11.8* 13.7*  NEUTROABS 6.3  --   HGB 11.8* 11.3*  HCT 36.0* 34.6*  MCV 90.8 91.1  PLT 339 312   Cardiac Enzymes: Recent Labs  Lab 12/18/17 1355  TROPONINI 0.05*   BNP (last 3 results) Recent Labs    07/19/17 0826 11/24/17 0621 12/18/17 1355  BNP 246.0* 384.0* 186.0*     CBG: Recent Labs  Lab 12/21/17 1203 12/21/17 1713 12/21/17 2117 12/22/17 0745 12/22/17 1148  GLUCAP 154* 105* 171* 118* 180*    Recent Results (from the past 240 hour(s))  Blood Culture (routine x 2)     Status: Abnormal   Collection Time: 12/18/17  3:15 PM  Result Value Ref Range Status   Specimen Description   Final    BLOOD BLOOD RIGHT HAND Performed at [facility], [address], [place], [place] [address]    Special Requests   Final    BOTTLES DRAWN AEROBIC AND ANAEROBIC Blood Culture adequate volume Performed at [facility], [address] [place], [place] [address]    Culture  Setup Time   Final    Organism ID to follow GRAM POSITIVE COCCI AEROBIC BOTTLE ONLY CRITICAL RESULT CALLED TO, READ BACK BY AND VERIFIED WITH: JASON ROBBINS AT 1632 ON 12/19/2017 JJB Performed at [facility], [address] [place], [place]  [address]    Culture (A)  Final    STAPHYLOCOCCUS HOMINIS THE SIGNIFICANCE OF ISOLATING THIS ORGANISM FROM A SINGLE SET OF BLOOD CULTURES WHEN MULTIPLE SETS ARE DRAWN IS UNCERTAIN. PLEASE NOTIFY THE MICROBIOLOGY DEPARTMENT WITHIN ONE WEEK IF SPECIATION AND SENSITIVITIES ARE REQUIRED. Performed at [facility], [address] [place], [place] [address]    Report Status 12/22/2017 FINAL  Final  Blood Culture (routine x 2)     Status: Abnormal   Collection Time: 12/18/17  3:15 PM  Result Value Ref Range Status   Specimen Description   Final    BLOOD RIGHT ANTECUBITAL Performed at [facility], [address], [place], [place] [address]    Special Requests   Final    BOTTLES DRAWN AEROBIC AND ANAEROBIC Blood Culture adequate volume Performed at [facility], [address], [place], [place] [address]    Culture  Setup Time   Final    GRAM POSITIVE COCCI ANAEROBIC BOTTLE ONLY CRITICAL VALUE NOTED.  VALUE IS CONSISTENT WITH PREVIOUSLY REPORTED AND CALLED VALUE. Performed at [facility], [address] [place], [place] [address]    Culture (A)  Final    STAPHYLOCOCCUS EPIDERMIDIS THE SIGNIFICANCE OF ISOLATING THIS ORGANISM FROM A SINGLE SET OF BLOOD CULTURES WHEN MULTIPLE SETS ARE DRAWN IS UNCERTAIN. PLEASE NOTIFY THE MICROBIOLOGY DEPARTMENT WITHIN ONE WEEK IF SPECIATION AND SENSITIVITIES ARE REQUIRED. Performed at [facility], [address], [place], [place] [address]    Report Status 12/22/2017 FINAL  Final  Blood Culture ID Panel (Reflexed)     Status: Abnormal   Collection Time: 12/18/17  3:15 PM  Result Value Ref Range Status   Enterococcus species NOT DETECTED NOT DETECTED Final   Listeria monocytogenes NOT DETECTED NOT DETECTED Final   Staphylococcus species DETECTED (A) NOT DETECTED Final    Comment: Methicillin (oxacillin) resistant coagulase negative staphylococcus. Possible blood culture contaminant (unless isolated from more than  one blood culture draw or clinical case suggests pathogenicity). No antibiotic treatment is indicated for blood  culture contaminants. CRITICAL RESULT CALLED TO, READ BACK BY AND VERIFIED WITH: JASON ROBBINS AT 1632 ON 12/19/2017 JJB    Staphylococcus aureus NOT DETECTED NOT DETECTED Final   Methicillin resistance DETECTED (A) NOT DETECTED Final    Comment: CRITICAL RESULT CALLED TO, READ BACK BY AND VERIFIED WITH: JASON ROBBINS AT 1632 ON 12/19/2017 JJB    Streptococcus species NOT DETECTED NOT DETECTED Final   Streptococcus agalactiae NOT DETECTED NOT DETECTED Final   Streptococcus pneumoniae NOT DETECTED NOT DETECTED Final   Streptococcus pyogenes NOT DETECTED NOT DETECTED Final   Acinetobacter baumannii NOT DETECTED NOT DETECTED Final   Enterobacteriaceae species NOT DETECTED  NOT DETECTED Final   Enterobacter cloacae complex NOT DETECTED NOT DETECTED Final   Escherichia coli NOT DETECTED NOT DETECTED Final   Klebsiella oxytoca NOT DETECTED NOT DETECTED Final   Klebsiella pneumoniae NOT DETECTED NOT DETECTED Final   Proteus species NOT DETECTED NOT DETECTED Final   Serratia marcescens NOT DETECTED NOT DETECTED Final   Haemophilus influenzae NOT DETECTED NOT DETECTED Final   Neisseria meningitidis NOT DETECTED NOT DETECTED Final   Pseudomonas aeruginosa NOT DETECTED NOT DETECTED Final   Candida albicans NOT DETECTED NOT DETECTED Final   Candida glabrata NOT DETECTED NOT DETECTED Final   Candida krusei NOT DETECTED NOT DETECTED Final   Candida parapsilosis NOT DETECTED NOT DETECTED Final   Candida tropicalis NOT DETECTED NOT DETECTED Final    Comment: Performed at [facility], [address] [place], [place] [address]     Scheduled Meds: . acetylcysteine  3 mL Nebulization BID  . aspirin EC  81 mg Oral Daily  . azithromycin  500 mg Oral Daily  . budesonide (PULMICORT) nebulizer solution  0.5 mg Nebulization BID  . busPIRone  10 mg Oral BID  . clopidogrel  75 mg Oral  Daily  . docusate sodium  100 mg Oral BID  . enoxaparin (LOVENOX) injection  40 mg Subcutaneous Q24H  . escitalopram  30 mg Oral QHS  . ezetimibe  10 mg Oral Daily  . gabapentin  300 mg Oral TID  . insulin aspart  0-15 Units Subcutaneous TID WC  . insulin aspart  0-5 Units Subcutaneous QHS  . ipratropium-albuterol  3 mL Nebulization Q6H  . isosorbide mononitrate  30 mg Oral Daily  . lisinopril  5 mg Oral Daily  . metFORMIN  500 mg Oral BID WC  . methylPREDNISolone (SOLU-MEDROL) injection  60 mg Intravenous Q6H  . metoprolol tartrate  50 mg Oral BID  . nicotine  14 mg Transdermal Daily  . tiotropium  1 capsule Inhalation Daily   Continuous Infusions: . cefTRIAXone (ROCEPHIN)  IV      Assessment/Plan:  1. Acute on chronic hypoxic respiratory failure.  Oxygen requirement went up to 6 L I have ordered a stat CT scan of the chest.  Continue therapy as below. 2. COPD exacerbation.  Change Solu-Medrol 60 mg IV every 6 hours.  Continue DuoNeb nebulizer solution and budesonide nebulizers. 3. Pneumonia left lower lobe.  Continue Rocephin and Zithromax 4. Essential hypertension continue lisinopril, metoprolol. 5. History of coronary artery disease continue on aspirin, Plavix, metoprolol and restart Crestor. 6. Tobacco abuse on nicotine patch 7. Rib fracture.  Roxicodone to 10 mg as needed pain 8. Type 2 diabetes mellitus on sliding scale insulin and metformin 9. History of hepatitis C 10. History of chronic systolic congestive heart failure.  Currently no signs.  Code Status:     Code Status Orders  (From admission, onward)        Start     Ordered   12/18/17 1503  Full code  Continuous     12/18/17 1504    Code Status History    Date Active Date Inactive Code Status Order ID Comments User Context   12/12/2017 2256 12/14/2017 1612 Full Code [id number]  Oralia Manis, MD Inpatient   11/24/2017 0808 11/29/2017 1527 Full Code [id number]  Alford Highland, MD ED   07/19/2017 1030  07/23/2017 1513 Full Code [id number]  Alford Highland, MD ED   07/08/2017 1412 07/08/2017 1913 Partial Code [id number]  Milagros Loll, MD Inpatient   07/03/2017 0111 07/08/2017  1412 Full Code [id number]  Bertrum Sol, MD Inpatient   06/15/2016 1007 06/15/2016 2242 Full Code [id number]  Shaune Pollack, MD Inpatient   06/09/2015 1159 06/10/2015 1527 Full Code [id number]  Houston Siren, MD Inpatient    Advance Directive Documentation     Most Recent Value  Type of Advance Directive  Healthcare Power of Attorney  Pre-existing out of facility DNR order (yellow form or pink MOST form)  -  "MOST" Form in Place?  -     Time spent: 25 minutes  Zoila Ditullio [employer]

## 2017-12-22 NOTE — Progress Notes (Signed)
Physical Therapy Treatment Patient Details Name: Kyle Rich MRN: [id number] DOB: September 04, 1956 Today's Date: 12/22/2017    History of Present Illness Pt is a62 y.o.malewith a known history of hypertension, diabetes, COPD, tobacco use who was here in the hospital recently for left sixth rib fracture presents to the ER again due to worsening shortness of breath and wheezing. Patient continued to smoke in spite of his shortness of breath. Today he has COPD exacerbation. There is some concern about a left lower lobe infiltrate versus chest wall mass. Patient has been given IV antibiotics and is being admitted to the hospital.  Assessment includes: acute on chronic hypoxic respiratory failure, pneumonia LLL, HTN, CAD, rib fracture, CHF, and h/o hepatitis C.    PT Comments    Pt continues to present with min deficits in gait and mobility and moderate deficits in activity tolerance.  Pt's baseline vitals on 6LO2/min include SpO2 92% and HR 63 bpm.  After amb 40' with a RW SpO2 93% and HR 70 bpm.  After 100' without AD SpO2 93% and HR 77 bpm.  After 200' without AD SpO2 93% with HR 78 bpm.  Pt reported SOB as minimal after each amb session rating his SOB as a max of 2-3/10.  Pt was steady with amb with some minimal drifting left/right when ambulating without an AD but no LOB.  Pt continues to report a desire to quit smoking with encouragement provided.  Pt will benefit from HHPT services upon discharge to safely address above deficits for decreased risk of further functional decline and eventual return to PLOF.    Follow Up Recommendations  Home health PT     Equipment Recommendations  None recommended by PT    Recommendations for Other Services       Precautions / Restrictions Precautions Precautions: Fall Precaution Comments: L 6th Rib fx Restrictions Weight Bearing Restrictions: No    Mobility  Bed Mobility Overal bed mobility: Modified Independent Bed Mobility: Supine to Sit;Sit  to Supine     Supine to sit: HOB elevated Sit to supine: HOB elevated   General bed mobility comments: Extra time and effort with bed mobility tasks and limited primarily by pain  Transfers Overall transfer level: Needs assistance Equipment used: None Transfers: Sit to/from Stand Sit to Stand: Supervision         General transfer comment: Extra effort required during transfers but steady without the need of physical assistance  Ambulation/Gait Ambulation/Gait assistance: Supervision Ambulation Distance (Feet): 200 Feet Assistive device: Rolling walker (2 wheeled);None Gait Pattern/deviations: WFL(Within Functional Limits)     General Gait Details: Pt steady with amb without AD with SpO2 92-93% throughout session measured frequently on 6LO2/min; pt's SOB minimal during session with max rating of 2/10   Stairs             Wheelchair Mobility    Modified Rankin (Stroke Patients Only)       Balance Overall balance assessment: Needs assistance Sitting-balance support: Bilateral upper extremity supported;Feet supported Sitting balance-Leahy Scale: Normal     Standing balance support: No upper extremity supported Standing balance-Leahy Scale: Good                              Cognition Arousal/Alertness: Awake/alert Behavior During Therapy: WFL for tasks assessed/performed Overall Cognitive Status: Within Functional Limits for tasks assessed  Exercises Total Joint Exercises Ankle Circles/Pumps: AROM;Both;10 reps Quad Sets: Strengthening;Both;10 reps Gluteal Sets: Strengthening;Both;10 reps Hip ABduction/ADduction: AROM;Both;10 reps Straight Leg Raises: AROM;Both;10 reps Long Arc Quad: AROM;Both;10 reps Knee Flexion: AROM;Both;10 reps    General Comments        Pertinent Vitals/Pain Pain Assessment: 0-10 Pain Score: 7  Pain Location: L 6th rib region Pain Descriptors / Indicators:  Sharp;Shooting Pain Intervention(s): Premedicated before session;Monitored during session    Home Living                      Prior Function            PT Goals (current goals can now be found in the care plan section) Progress towards PT goals: Progressing toward goals    Frequency    Min 2X/week      PT Plan Current plan remains appropriate    Co-evaluation              AM-PAC PT "6 Clicks" Daily Activity  Outcome Measure                   End of Session Equipment Utilized During Treatment: Gait belt;Oxygen Activity Tolerance: No increased pain;Patient tolerated treatment well Patient left: in chair;with call bell/phone within reach;with chair alarm set Nurse Communication: Mobility status PT Visit Diagnosis: Muscle weakness (generalized) (M62.81);Difficulty in walking, not elsewhere classified (R26.2)     Time: [id number] PT Time Calculation (min) (ACUTE ONLY): 26 min  Charges:  $Gait Training: 8-22 mins $Therapeutic Exercise: 8-22 mins                    G Codes:       Kyle Rich. Scott Lizanne Erker PT, DPT 12/22/17, 2:57 PM

## 2017-12-23 LAB — GLUCOSE, CAPILLARY
GLUCOSE-CAPILLARY: 188 mg/dL — AB (ref 65–99)
GLUCOSE-CAPILLARY: 257 mg/dL — AB (ref 65–99)
Glucose-Capillary: 151 mg/dL — ABNORMAL HIGH (ref 65–99)
Glucose-Capillary: 165 mg/dL — ABNORMAL HIGH (ref 65–99)

## 2017-12-23 NOTE — Progress Notes (Signed)
Patient ID: Kyle Rich, male   DOB: 11-Sep-1955, 62 y.o.   MRN: [id number]   [employer] PROGRESS NOTE  Kyle Rich WUJ:[id number] DOB: 11/10/1955 DOA: 12/18/2017 PCP: Rayetta Humphrey, MD  HPI/Subjective: Patient's breathing is improved compared to yesterday  Objective: Vitals:   12/23/17 0758 12/23/17 1114  BP:    Pulse:    Resp:    Temp:    SpO2: (!) 86% 92%    Filed Weights   12/21/17 0431 12/22/17 0500 12/23/17 0500  Weight: 85.6 kg (188 lb 11.4 oz) 87.3 kg (192 lb 6.4 oz) 88.2 kg (194 lb 7.1 oz)    ROS: Review of Systems  Constitutional: Negative for chills and fever.  Eyes: Negative for blurred vision.  Respiratory: Positive for cough, shortness of breath and wheezing.   Cardiovascular: Negative for chest pain.  Gastrointestinal: Negative for abdominal pain, constipation, diarrhea, nausea and vomiting.  Genitourinary: Negative for dysuria.  Musculoskeletal: Positive for joint pain.  Neurological: Negative for dizziness and headaches.   Exam: Physical Exam  Constitutional: He is oriented to person, place, and time.  HENT:  Nose: No mucosal edema.  Mouth/Throat: No oropharyngeal exudate or posterior oropharyngeal edema.  Eyes: Pupils are equal, round, and reactive to light. Conjunctivae, EOM and lids are normal.  Neck: No JVD present. Carotid bruit is not present. No edema present. No thyroid mass and no thyromegaly present.  Cardiovascular: S1 normal and S2 normal. Exam reveals no gallop.  No murmur heard. Pulses:      Dorsalis pedis pulses are 2+ on the right side, and 2+ on the left side.  Respiratory: No respiratory distress. He has decreased breath sounds in the right middle field, the right lower field, the left middle field and the left lower field. He has wheezes in the right middle field, the right lower field, the left middle field and the left lower field. He has no rhonchi. He has no rales.  GI: Soft. Bowel sounds are normal. There is no  tenderness.  Musculoskeletal:       Right ankle: He exhibits no swelling.       Left ankle: He exhibits no swelling.  Lymphadenopathy:    He has no cervical adenopathy.  Neurological: He is alert and oriented to person, place, and time. No cranial nerve deficit.  Skin: Skin is warm. No rash noted. Nails show no clubbing.  Psychiatric: He has a normal mood and affect.      Data Reviewed: Basic Metabolic Panel: Recent Labs  Lab 12/18/17 1355 12/19/17 0853  NA 140 136  K 4.2 4.6  CL 102 100*  CO2 32 28  GLUCOSE 110* 165*  BUN 31* 33*  CREATININE 1.15 0.85  CALCIUM 8.8* 8.3*   CBC: Recent Labs  Lab 12/18/17 1355 12/19/17 0853  WBC 11.8* 13.7*  NEUTROABS 6.3  --   HGB 11.8* 11.3*  HCT 36.0* 34.6*  MCV 90.8 91.1  PLT 339 312   Cardiac Enzymes: Recent Labs  Lab 12/18/17 1355  TROPONINI 0.05*   BNP (last 3 results) Recent Labs    07/19/17 0826 11/24/17 0621 12/18/17 1355  BNP 246.0* 384.0* 186.0*     CBG: Recent Labs  Lab 12/22/17 1148 12/22/17 1650 12/22/17 2100 12/23/17 0756 12/23/17 1153  GLUCAP 180* 167* 207* 151* 188*    Recent Results (from the past 240 hour(s))  Blood Culture (routine x 2)     Status: Abnormal   Collection Time: 12/18/17  3:15 PM  Result Value  Ref Range Status   Specimen Description   Final    BLOOD BLOOD RIGHT HAND Performed at [facility], [address], [place], [place] [address]    Special Requests   Final    BOTTLES DRAWN AEROBIC AND ANAEROBIC Blood Culture adequate volume Performed at [facility], [address] [place], [place] [address]    Culture  Setup Time   Final    Organism ID to follow GRAM POSITIVE COCCI AEROBIC BOTTLE ONLY CRITICAL RESULT CALLED TO, READ BACK BY AND VERIFIED WITH: JASON ROBBINS AT 1632 ON 12/19/2017 JJB Performed at [facility], [address] [place], [place] [address]    Culture (A)  Final    STAPHYLOCOCCUS HOMINIS THE SIGNIFICANCE OF ISOLATING  THIS ORGANISM FROM A SINGLE SET OF BLOOD CULTURES WHEN MULTIPLE SETS ARE DRAWN IS UNCERTAIN. PLEASE NOTIFY THE MICROBIOLOGY DEPARTMENT WITHIN ONE WEEK IF SPECIATION AND SENSITIVITIES ARE REQUIRED. Performed at [facility], [address], [place], [place] [address]    Report Status 12/22/2017 FINAL  Final  Blood Culture (routine x 2)     Status: Abnormal   Collection Time: 12/18/17  3:15 PM  Result Value Ref Range Status   Specimen Description   Final    BLOOD RIGHT ANTECUBITAL Performed at [facility], [address] [place], [place] [address]    Special Requests   Final    BOTTLES DRAWN AEROBIC AND ANAEROBIC Blood Culture adequate volume Performed at [facility], [address] [place], [place] [address]    Culture  Setup Time   Final    GRAM POSITIVE COCCI ANAEROBIC BOTTLE ONLY CRITICAL VALUE NOTED.  VALUE IS CONSISTENT WITH PREVIOUSLY REPORTED AND CALLED VALUE. Performed at [facility], [address] [place], [place] [address]    Culture (A)  Final    STAPHYLOCOCCUS EPIDERMIDIS THE SIGNIFICANCE OF ISOLATING THIS ORGANISM FROM A SINGLE SET OF BLOOD CULTURES WHEN MULTIPLE SETS ARE DRAWN IS UNCERTAIN. PLEASE NOTIFY THE MICROBIOLOGY DEPARTMENT WITHIN ONE WEEK IF SPECIATION AND SENSITIVITIES ARE REQUIRED. Performed at [facility], [address] [place], [place] [address]    Report Status 12/22/2017 FINAL  Final  Blood Culture ID Panel (Reflexed)     Status: Abnormal   Collection Time: 12/18/17  3:15 PM  Result Value Ref Range Status   Enterococcus species NOT DETECTED NOT DETECTED Final   Listeria monocytogenes NOT DETECTED NOT DETECTED Final   Staphylococcus species DETECTED (A) NOT DETECTED Final    Comment: Methicillin (oxacillin) resistant coagulase negative staphylococcus. Possible blood culture contaminant (unless isolated from more than one blood culture draw or clinical case suggests pathogenicity). No antibiotic treatment  is indicated for blood  culture contaminants. CRITICAL RESULT CALLED TO, READ BACK BY AND VERIFIED WITH: JASON ROBBINS AT 1632 ON 12/19/2017 JJB    Staphylococcus aureus NOT DETECTED NOT DETECTED Final   Methicillin resistance DETECTED (A) NOT DETECTED Final    Comment: CRITICAL RESULT CALLED TO, READ BACK BY AND VERIFIED WITH: JASON ROBBINS AT 1632 ON 12/19/2017 JJB    Streptococcus species NOT DETECTED NOT DETECTED Final   Streptococcus agalactiae NOT DETECTED NOT DETECTED Final   Streptococcus pneumoniae NOT DETECTED NOT DETECTED Final   Streptococcus pyogenes NOT DETECTED NOT DETECTED Final   Acinetobacter baumannii NOT DETECTED NOT DETECTED Final   Enterobacteriaceae species NOT DETECTED NOT DETECTED Final   Enterobacter cloacae complex NOT DETECTED NOT DETECTED Final   Escherichia coli NOT DETECTED NOT DETECTED Final  Klebsiella oxytoca NOT DETECTED NOT DETECTED Final   Klebsiella pneumoniae NOT DETECTED NOT DETECTED Final   Proteus species NOT DETECTED NOT DETECTED Final   Serratia marcescens NOT DETECTED NOT DETECTED Final   Haemophilus influenzae NOT DETECTED NOT DETECTED Final   Neisseria meningitidis NOT DETECTED NOT DETECTED Final   Pseudomonas aeruginosa NOT DETECTED NOT DETECTED Final   Candida albicans NOT DETECTED NOT DETECTED Final   Candida glabrata NOT DETECTED NOT DETECTED Final   Candida krusei NOT DETECTED NOT DETECTED Final   Candida parapsilosis NOT DETECTED NOT DETECTED Final   Candida tropicalis NOT DETECTED NOT DETECTED Final    Comment: Performed at [facility], [address] [place], [place] [address]     Scheduled Meds: . acetylcysteine  3 mL Nebulization BID  . aspirin EC  81 mg Oral Daily  . budesonide (PULMICORT) nebulizer solution  0.5 mg Nebulization BID  . busPIRone  10 mg Oral BID  . clopidogrel  75 mg Oral Daily  . docusate sodium  100 mg Oral BID  . enoxaparin (LOVENOX) injection  40 mg Subcutaneous Q24H  . escitalopram  30  mg Oral QHS  . ezetimibe  10 mg Oral Daily  . gabapentin  300 mg Oral TID  . insulin aspart  0-15 Units Subcutaneous TID WC  . insulin aspart  0-5 Units Subcutaneous QHS  . ipratropium-albuterol  3 mL Nebulization Q4H  . isosorbide mononitrate  30 mg Oral Daily  . lisinopril  5 mg Oral Daily  . metFORMIN  500 mg Oral BID WC  . methylPREDNISolone (SOLU-MEDROL) injection  60 mg Intravenous Q6H  . metoprolol tartrate  50 mg Oral BID  . nicotine  14 mg Transdermal Daily   Continuous Infusions:   Assessment/Plan:  1. Acute on chronic hypoxic respiratory failure.  Oxygen requirements improved 2. COPD exacerbation.  Continue Solu-Medrol 60 mg IV every 6 hours.  Continue DuoNeb nebulizer solution and budesonide nebulizers.  Change to oral prednisone tomorrow 3. Pneumonia left lower lobe.  Continue Rocephin and Zithromax oral antibiotics on discharge 4. Essential hypertension continue lisinopril, metoprolol. 5. History of coronary artery disease continue on aspirin, Plavix, metoprolol and restart Crestor. 6. Tobacco abuse on nicotine patch 7. Rib fracture.  Roxicodone to 10 mg as needed pain 8. Type 2 diabetes mellitus on sliding scale insulin and metformin 9. History of hepatitis C 10. History of chronic systolic congestive heart failure.  Currently no signs.  Code Status:     Code Status Orders  (From admission, onward)        Start     Ordered   12/18/17 1503  Full code  Continuous     12/18/17 1504    Code Status History    Date Active Date Inactive Code Status Order ID Comments User Context   12/12/2017 2256 12/14/2017 1612 Full Code [id number]  Oralia Manis, MD Inpatient   11/24/2017 0808 11/29/2017 1527 Full Code [id number]  Alford Highland, MD ED   07/19/2017 1030 07/23/2017 1513 Full Code [id number]  Alford Highland, MD ED   07/08/2017 1412 07/08/2017 1913 Partial Code [id number]  Milagros Loll, MD Inpatient   07/03/2017 0111 07/08/2017 1412 Full Code [id number]  Bertrum Sol, MD Inpatient   06/15/2016 1007 06/15/2016 2242 Full Code [id number]  Shaune Pollack, MD Inpatient   06/09/2015 1159 06/10/2015 1527 Full Code [id number]  Houston Siren, MD Inpatient    Advance Directive Documentation     Most Recent Value  Type of Advance Directive  Healthcare Power of Attorney  Pre-existing out of facility DNR order (yellow form or pink MOST form)  -  "MOST" Form in Place?  -     Time spent: 25 minutes  Kyle Laforge [employer]  [employer]

## 2017-12-23 NOTE — Progress Notes (Signed)
Oxygen became disconnected from water bottle on flowmeter, oxygen resumed at 4l after neb treatment.

## 2017-12-23 NOTE — Progress Notes (Signed)
No acute events this shift. Medicated for pain per mar; pain has been ache/soreness in left rib/side area. Pt has used flutter valve at times this shift. Slept in along interval this shift. cll bell in reach. o2 on at 4l.min through the night.

## 2017-12-24 LAB — GLUCOSE, CAPILLARY: GLUCOSE-CAPILLARY: 125 mg/dL — AB (ref 65–99)

## 2017-12-24 MED ORDER — IPRATROPIUM-ALBUTEROL 0.5-2.5 (3) MG/3ML IN SOLN: 3.0000 mL | Freq: Four times a day (QID) | RESPIRATORY_TRACT | 1 refills | Status: AC

## 2017-12-24 MED ORDER — PREDNISONE 10 MG (21) PO TBPK: ORAL_TABLET | ORAL | 0 refills | Status: AC

## 2017-12-24 MED ORDER — OXYCODONE HCL 10 MG PO TABS: 10.0000 mg | ORAL_TABLET | ORAL | 0 refills | Status: AC | PRN

## 2017-12-24 MED ORDER — HYDROCOD POLST-CPM POLST ER 10-8 MG/5ML PO SUER: 5.0000 mL | Freq: Two times a day (BID) | ORAL | 0 refills | Status: AC | PRN

## 2017-12-24 NOTE — Progress Notes (Signed)
Discharge instructions and prescriptions given to pt. IV removed. Has no questions or complaints at this time. Home O2 will be brought by pt's ride. Pt getting dressed, awaiting ride home.

## 2017-12-24 NOTE — Progress Notes (Signed)
Pt ambulated in hallway on 2L O2. Sats remained in the low 90s while ambulating. Pt on home oxygen.
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
## 2017-12-24 NOTE — Care Management (Signed)
Discharge to home today per Dr, Allena KatzPatel. Physical therapy recommending home with home health.  Chelsey with [facility] updated Gwenette GreetBrenda S Camree Wigington RN MSN CCM Care Management [phone]

## 2017-12-29 LAB — HIV ANTIBODY (ROUTINE TESTING W REFLEX): HIV Screen 4th Generation wRfx: NONREACTIVE

## 2018-01-22 ENCOUNTER — Other Ambulatory Visit: Payer: Self-pay | Admitting: Family Medicine

## 2018-01-22 ENCOUNTER — Ambulatory Visit
Admission: RE | Admit: 2018-01-22 | Discharge: 2018-01-22 | Disposition: A | Discharge status: Home / Self Care | Payer: Medicaid Other | Source: Ambulatory Visit | Attending: Family Medicine | Admitting: Family Medicine

## 2018-01-22 DIAGNOSIS — J189 Pneumonia, unspecified organism: Secondary | ICD-10-CM

## 2018-01-22 DIAGNOSIS — S2242XD Multiple fractures of ribs, left side, subsequent encounter for fracture with routine healing: Secondary | ICD-10-CM | POA: Diagnosis not present

## 2018-01-22 DIAGNOSIS — R7989 Other specified abnormal findings of blood chemistry: Secondary | ICD-10-CM | POA: Diagnosis present

## 2018-01-22 DIAGNOSIS — X58XXXD Exposure to other specified factors, subsequent encounter: Secondary | ICD-10-CM | POA: Insufficient documentation

## 2018-01-22 DIAGNOSIS — R918 Other nonspecific abnormal finding of lung field: Secondary | ICD-10-CM | POA: Insufficient documentation

## 2018-01-22 DIAGNOSIS — I700 Atherosclerosis of aorta: Secondary | ICD-10-CM | POA: Insufficient documentation

## 2018-03-14 ENCOUNTER — Ambulatory Visit: Payer: Self-pay | Admitting: Urology

## 2018-07-19 IMAGING — DX DG CHEST 1V PORT
1 series · 1 of 1 positions shown · non-contrast
Comparison: Chest radiograph 07/02/2017 and CT 07/03/2017

CLINICAL DATA: Increased shortness of breath over the past week.

EXAM:
PORTABLE CHEST 1 VIEW

[chest ap]
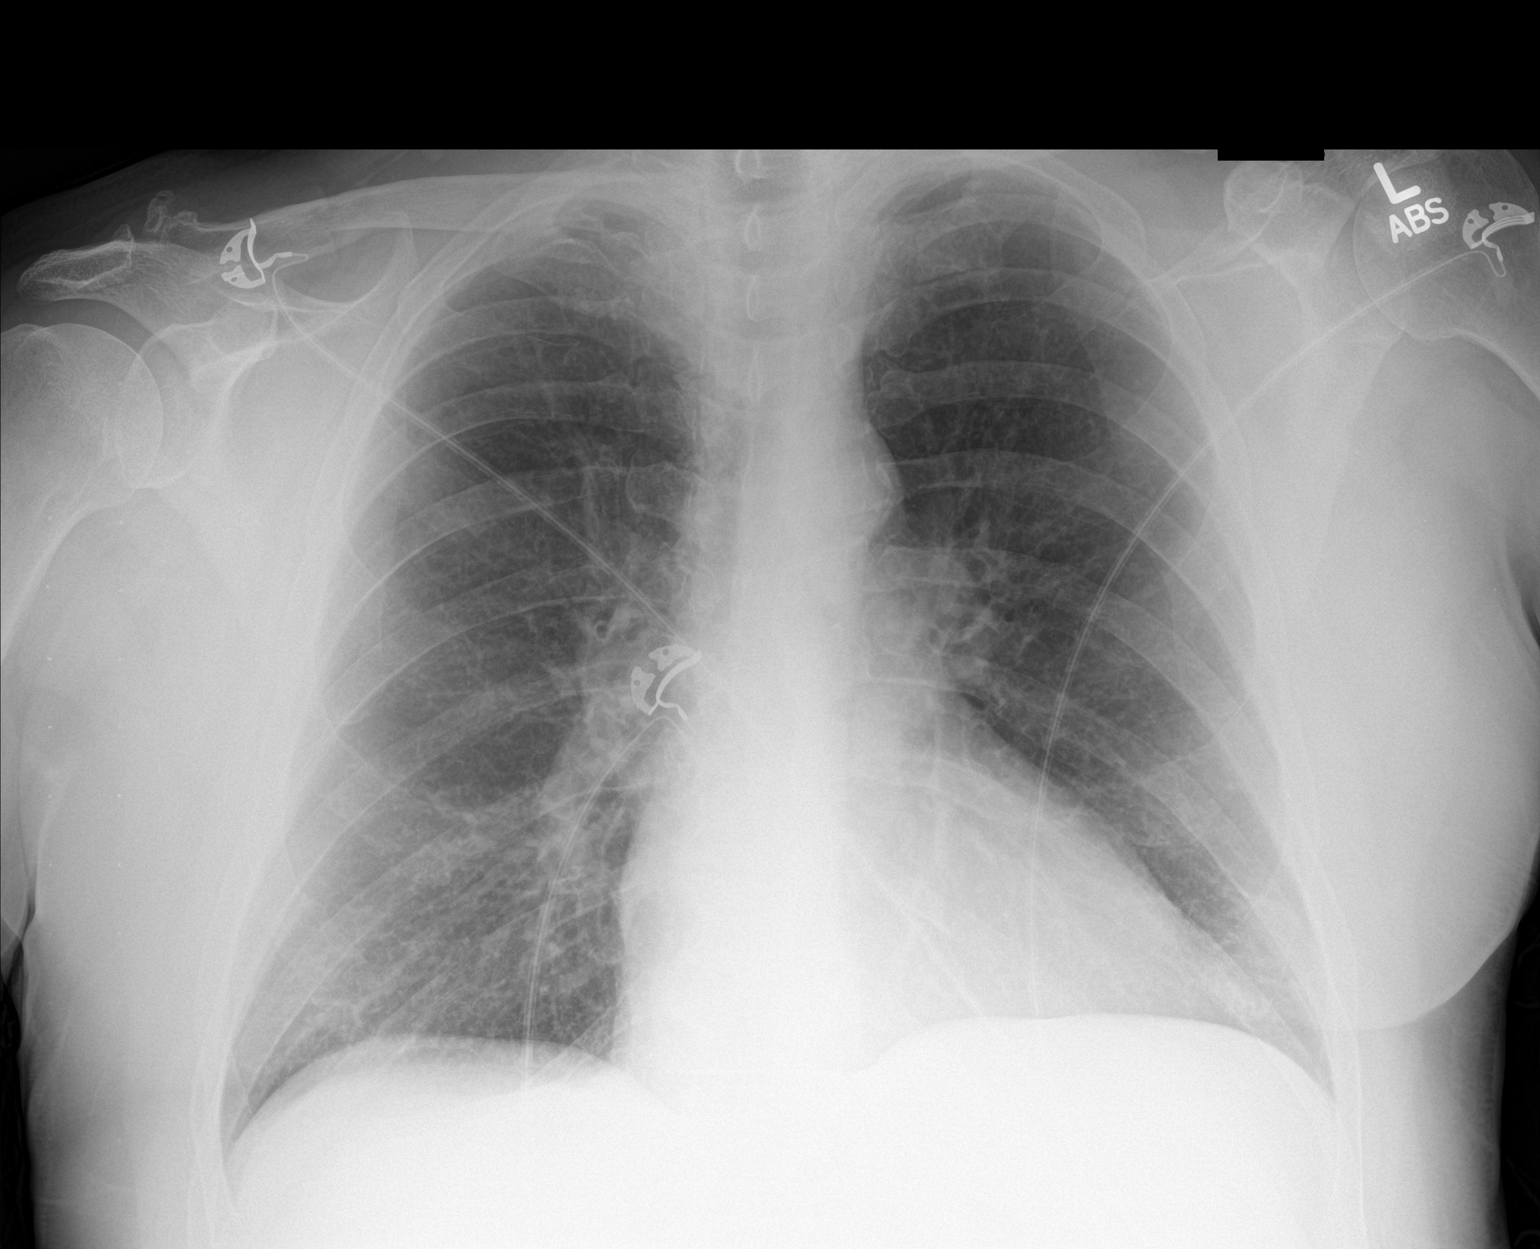

[1 of 1 positions shown; findings below may reference images not displayed]

FINDINGS: The cardiomediastinal silhouette is unchanged and within normal
limits. Aortic atherosclerosis is noted. Chronic interstitial
coarsening is similar to the prior radiograph. There is no evidence
of acute airspace consolidation, overt edema, sizable pleural
effusion, or pneumothorax. No acute osseous abnormality is
identified.
IMPRESSION: COPD without evidence of acute cardiopulmonary process.

## 2018-08-05 DEATH — deceased

## 2018-12-22 IMAGING — CT CT CHEST W/ CM
2 of 3 series · 15 of 36 positions shown, 18 images · IV contrast (iopamidol)
Comparison: 12/18/2017

CLINICAL DATA: Recent left side broken ribs. Shortness of breath,
chest pain.

EXAM:
CT CHEST WITH CONTRAST
TECHNIQUE: Multidetector CT imaging of the chest was performed during
intravenous contrast administration.
CONTRAST:  75mL KSJSST-DVV IOPAMIDOL (KSJSST-DVV) INJECTION 61%

[Series 2: axial st · axial · 0.61mm/px · z∈[-428,-178]mm · 12 of 147 slices shown, 15 images]
[im 11/147  mediastinal]
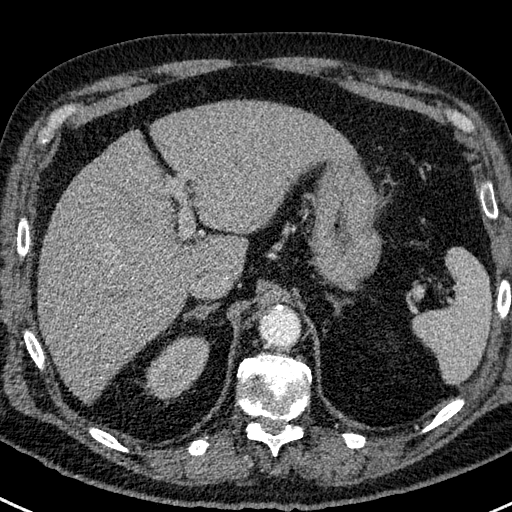
[im 11/147  lung]
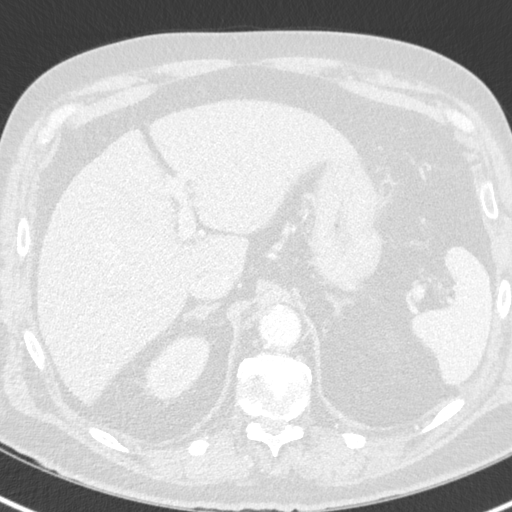
[im 22/147  lung]
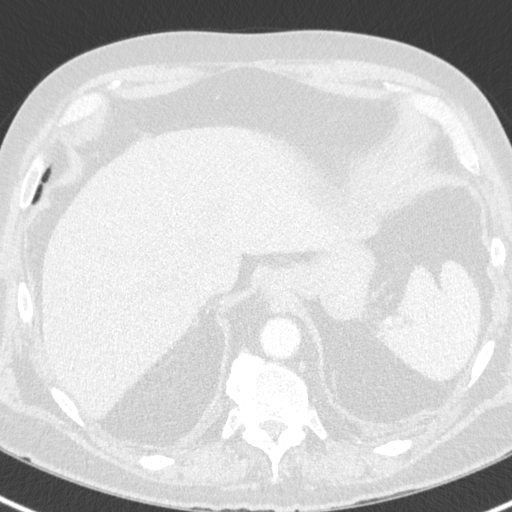
[im 33/147  lung]
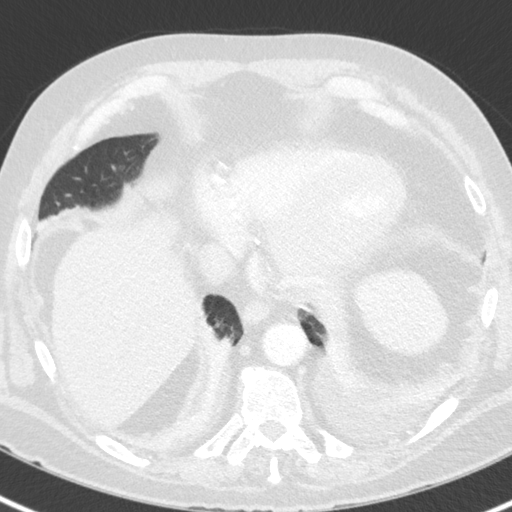
[im 44/147  lung]
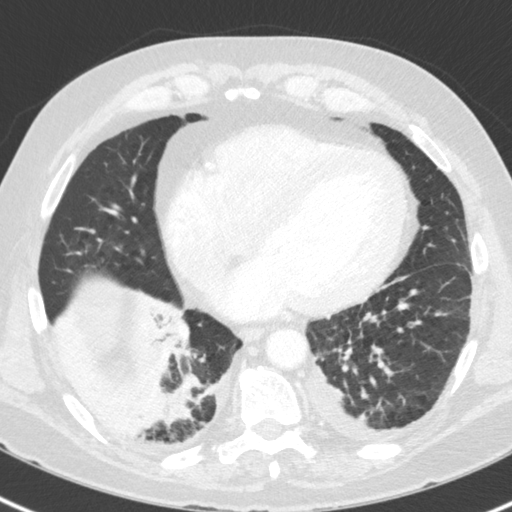
[im 55/147  mediastinal]
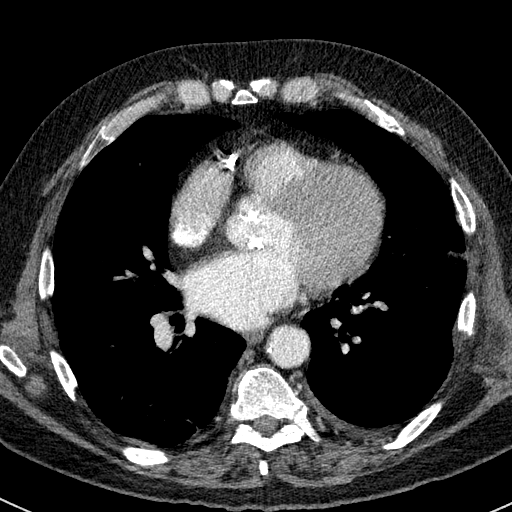
[im 55/147  lung]
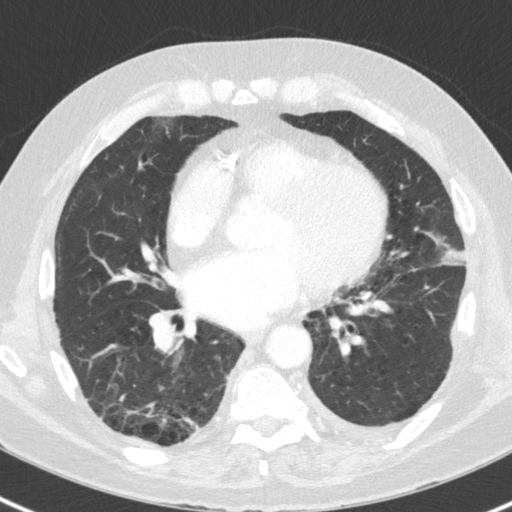
[im 65/147  lung]
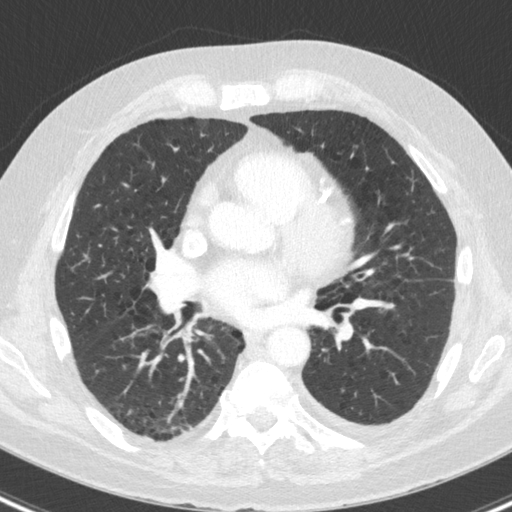
[im 82/147  lung]
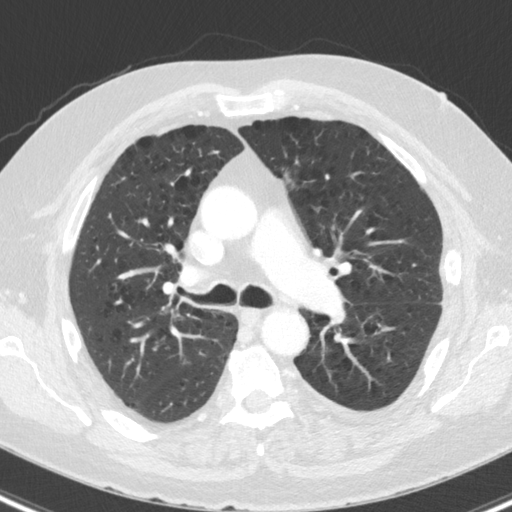
[im 92/147  lung]
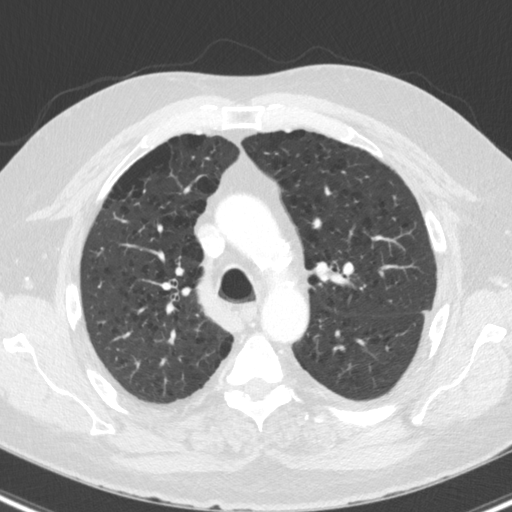
[im 103/147  mediastinal]
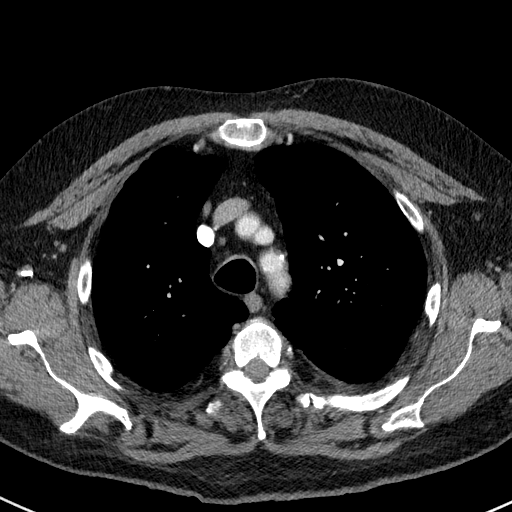
[im 103/147  lung]
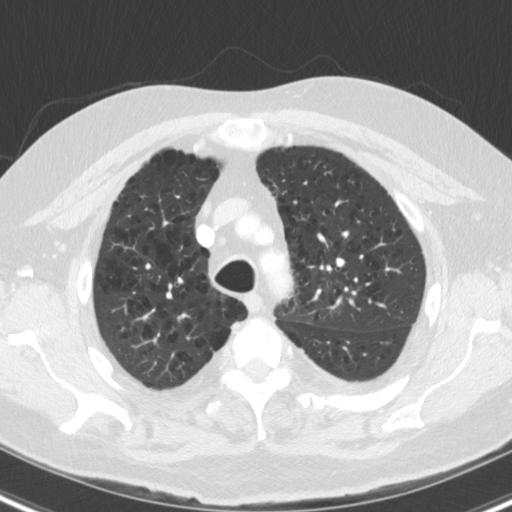
[im 114/147  lung]
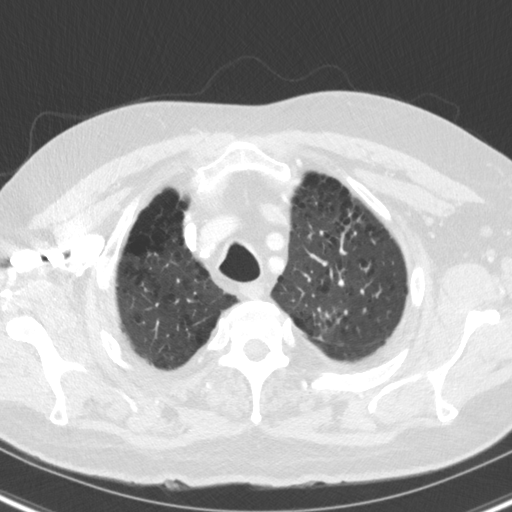
[im 125/147  lung]
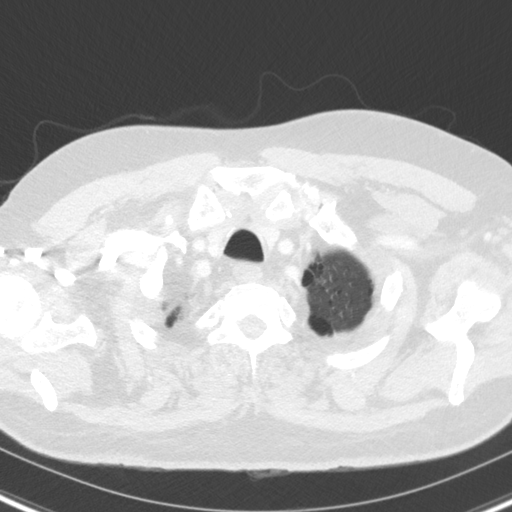
[im 136/147  lung]
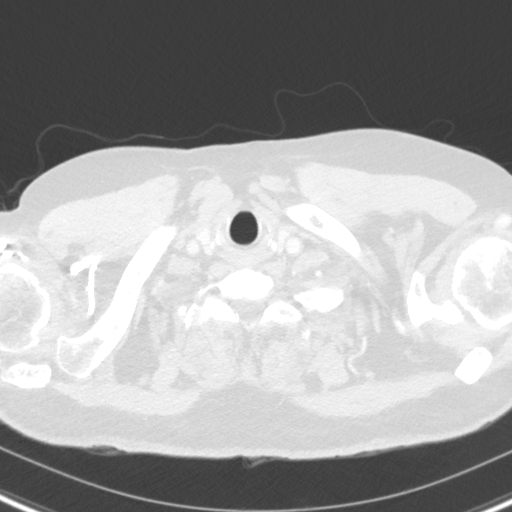

[Series 5: coronal · coronal · 0.62mm/px · 3 of 151 slices shown]
[im 31/151  lung]
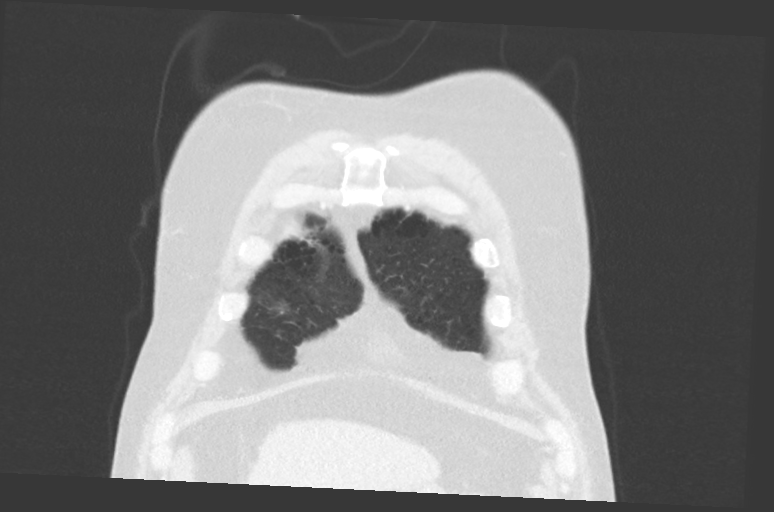
[im 61/151  lung]
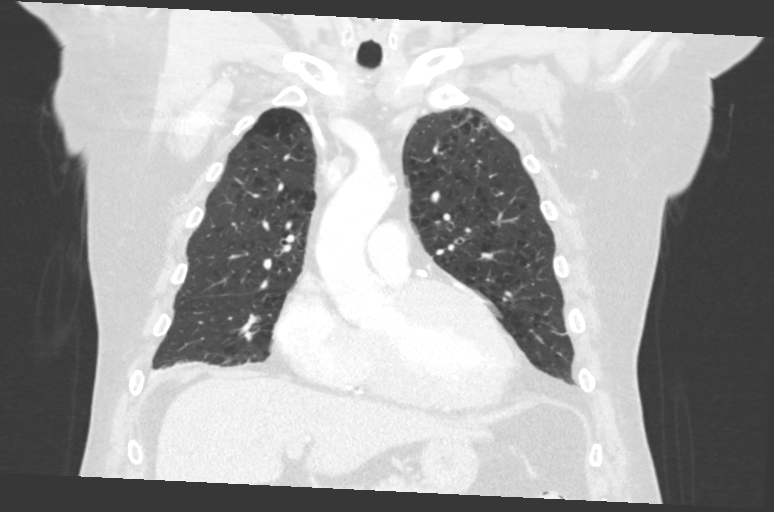
[im 91/151  lung]
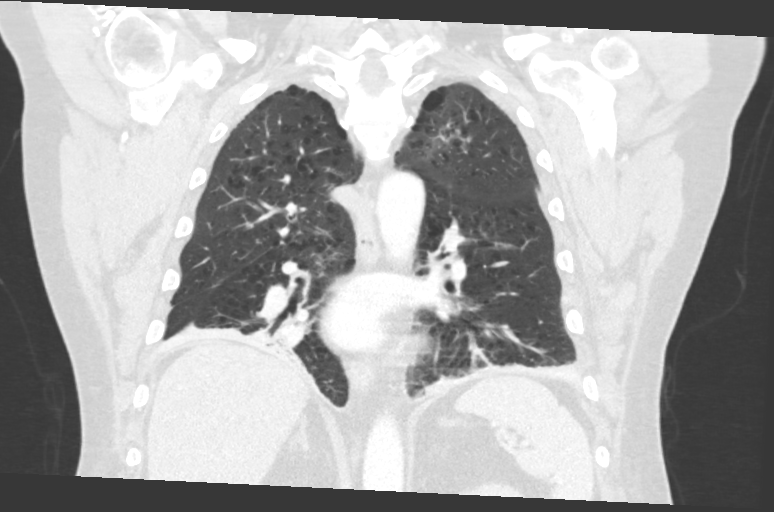

[15 of 36 positions shown; findings below may reference images not displayed]

FINDINGS: Cardiovascular: Diffuse severe coronary artery calcifications.
Scattered mild to moderate aortic calcifications. No evidence of
aortic aneurysm.

Mediastinum/Nodes: No mediastinal, hilar, or axillary adenopathy.

Lungs/Pleura: Moderate centrilobular and paraseptal emphysema.
Bilateral lower lobe airspace opacities are noted concerning for
pneumonia. Small left pleural effusion. Trace right effusion. No
pneumothorax.

Upper Abdomen: Imaging into the upper abdomen shows no acute
findings.

Musculoskeletal: Chest wall soft tissues are unremarkable. Fractures
noted through the left fifth through 7th ribs peripherally.
IMPRESSION: Left 5th through 7th rib fractures laterally, stable.

Small left effusion and trace right effusion. Bibasilar airspace
opacities are concerning for pneumonia.

Diffuse coronary artery disease.

Aortic Atherosclerosis ([id number]) and Emphysema (QQMJH-OUQ.3).
